# Patient Record
Sex: Female | Born: 1960 | Race: White | Hispanic: No | Marital: Married | Smoking: Former smoker
Health system: Southern US, Community
[De-identification: ages and names within clinical notes are randomized; demographics above are authoritative.]

## PROBLEM LIST (undated history)

## (undated) DIAGNOSIS — Z801 Family history of malignant neoplasm of trachea, bronchus and lung: Secondary | ICD-10-CM

## (undated) DIAGNOSIS — C50919 Malignant neoplasm of unspecified site of unspecified female breast: Secondary | ICD-10-CM

## (undated) DIAGNOSIS — I10 Essential (primary) hypertension: Secondary | ICD-10-CM

## (undated) DIAGNOSIS — T888XXA Other specified complications of surgical and medical care, not elsewhere classified, initial encounter: Secondary | ICD-10-CM

## (undated) DIAGNOSIS — C221 Intrahepatic bile duct carcinoma: Secondary | ICD-10-CM

## (undated) DIAGNOSIS — R739 Hyperglycemia, unspecified: Secondary | ICD-10-CM

## (undated) DIAGNOSIS — K529 Noninfective gastroenteritis and colitis, unspecified: Secondary | ICD-10-CM

## (undated) HISTORY — DX: Malignant neoplasm of unspecified site of unspecified female breast: C50.919

## (undated) HISTORY — PX: ABDOMINAL HYSTERECTOMY: SHX81

## (undated) HISTORY — DX: Family history of malignant neoplasm of trachea, bronchus and lung: Z80.1

## (undated) HISTORY — DX: Hyperglycemia, unspecified: R73.9

## (undated) HISTORY — PX: KNEE SURGERY: SHX244

## (undated) HISTORY — DX: Essential (primary) hypertension: I10

## (undated) HISTORY — PX: COLONOSCOPY: SHX174

## (undated) HISTORY — DX: Noninfective gastroenteritis and colitis, unspecified: K52.9

## (undated) HISTORY — PX: AUGMENTATION MAMMAPLASTY: SUR837

## (undated) HISTORY — PX: MASTECTOMY: SHX3

---

## 1997-12-04 ENCOUNTER — Ambulatory Visit (HOSPITAL_BASED_OUTPATIENT_CLINIC_OR_DEPARTMENT_OTHER): Admission: RE | Admit: 1997-12-04 | Discharge: 1997-12-04 | Payer: Self-pay

## 1997-12-18 ENCOUNTER — Ambulatory Visit (HOSPITAL_COMMUNITY): Admission: RE | Admit: 1997-12-18 | Discharge: 1997-12-19 | Payer: Self-pay

## 1998-01-16 ENCOUNTER — Ambulatory Visit (HOSPITAL_COMMUNITY): Admission: RE | Admit: 1998-01-16 | Discharge: 1998-01-17 | Payer: Self-pay

## 1998-06-17 ENCOUNTER — Other Ambulatory Visit: Admission: RE | Admit: 1998-06-17 | Discharge: 1998-06-17 | Payer: Self-pay | Admitting: Gynecology

## 1998-11-13 ENCOUNTER — Ambulatory Visit (HOSPITAL_COMMUNITY): Admission: RE | Admit: 1998-11-13 | Discharge: 1998-11-13 | Payer: Self-pay | Admitting: Oncology

## 1998-11-13 ENCOUNTER — Encounter: Payer: Self-pay | Admitting: Oncology

## 1999-01-01 ENCOUNTER — Ambulatory Visit (HOSPITAL_BASED_OUTPATIENT_CLINIC_OR_DEPARTMENT_OTHER): Admission: RE | Admit: 1999-01-01 | Discharge: 1999-01-01 | Payer: Self-pay | Admitting: Plastic Surgery

## 1999-11-18 ENCOUNTER — Encounter: Admission: RE | Admit: 1999-11-18 | Discharge: 1999-11-18 | Payer: Self-pay | Admitting: Oncology

## 1999-11-18 ENCOUNTER — Encounter: Admission: RE | Admit: 1999-11-18 | Discharge: 1999-11-18 | Payer: Self-pay

## 1999-11-18 ENCOUNTER — Encounter: Payer: Self-pay | Admitting: Oncology

## 2000-04-19 ENCOUNTER — Encounter: Admission: RE | Admit: 2000-04-19 | Discharge: 2000-04-19 | Payer: Self-pay | Admitting: Internal Medicine

## 2000-04-19 ENCOUNTER — Encounter: Payer: Self-pay | Admitting: Internal Medicine

## 2000-07-12 ENCOUNTER — Ambulatory Visit (HOSPITAL_COMMUNITY): Admission: RE | Admit: 2000-07-12 | Discharge: 2000-07-12 | Payer: Self-pay | Admitting: Gastroenterology

## 2000-07-12 ENCOUNTER — Encounter (INDEPENDENT_AMBULATORY_CARE_PROVIDER_SITE_OTHER): Payer: Self-pay | Admitting: Specialist

## 2000-11-02 ENCOUNTER — Encounter: Admission: RE | Admit: 2000-11-02 | Discharge: 2000-11-02 | Payer: Self-pay | Admitting: Gastroenterology

## 2000-11-02 ENCOUNTER — Encounter: Payer: Self-pay | Admitting: Gastroenterology

## 2000-11-22 ENCOUNTER — Encounter: Admission: RE | Admit: 2000-11-22 | Discharge: 2000-11-22 | Payer: Self-pay | Admitting: Oncology

## 2000-11-22 ENCOUNTER — Encounter: Payer: Self-pay | Admitting: Oncology

## 2001-07-06 ENCOUNTER — Other Ambulatory Visit: Admission: RE | Admit: 2001-07-06 | Discharge: 2001-07-06 | Payer: Self-pay | Admitting: Gynecology

## 2001-12-09 ENCOUNTER — Encounter: Admission: RE | Admit: 2001-12-09 | Discharge: 2001-12-09 | Payer: Self-pay | Admitting: Oncology

## 2001-12-09 ENCOUNTER — Encounter: Payer: Self-pay | Admitting: Oncology

## 2002-12-11 ENCOUNTER — Encounter: Admission: RE | Admit: 2002-12-11 | Discharge: 2002-12-11 | Payer: Self-pay | Admitting: Oncology

## 2003-02-13 ENCOUNTER — Other Ambulatory Visit: Admission: RE | Admit: 2003-02-13 | Discharge: 2003-02-13 | Payer: Self-pay | Admitting: Gynecology

## 2003-10-11 ENCOUNTER — Encounter (INDEPENDENT_AMBULATORY_CARE_PROVIDER_SITE_OTHER): Payer: Self-pay | Admitting: *Deleted

## 2003-10-11 ENCOUNTER — Inpatient Hospital Stay (HOSPITAL_COMMUNITY): Admission: EM | Admit: 2003-10-11 | Discharge: 2003-10-12 | Payer: Self-pay | Admitting: Emergency Medicine

## 2003-11-27 ENCOUNTER — Ambulatory Visit (HOSPITAL_COMMUNITY): Admission: RE | Admit: 2003-11-27 | Discharge: 2003-11-27 | Payer: Self-pay | Admitting: Gastroenterology

## 2003-12-02 ENCOUNTER — Ambulatory Visit: Payer: Self-pay | Admitting: Oncology

## 2003-12-12 ENCOUNTER — Encounter: Admission: RE | Admit: 2003-12-12 | Discharge: 2003-12-12 | Payer: Self-pay | Admitting: Oncology

## 2004-05-23 ENCOUNTER — Ambulatory Visit: Payer: Self-pay | Admitting: Oncology

## 2004-06-05 ENCOUNTER — Ambulatory Visit (HOSPITAL_COMMUNITY): Admission: RE | Admit: 2004-06-05 | Discharge: 2004-06-05 | Payer: Self-pay | Admitting: Oncology

## 2004-10-21 ENCOUNTER — Other Ambulatory Visit: Admission: RE | Admit: 2004-10-21 | Discharge: 2004-10-21 | Payer: Self-pay | Admitting: Gynecology

## 2004-11-21 ENCOUNTER — Ambulatory Visit: Payer: Self-pay | Admitting: Oncology

## 2004-12-12 ENCOUNTER — Encounter: Admission: RE | Admit: 2004-12-12 | Discharge: 2004-12-12 | Payer: Self-pay | Admitting: Oncology

## 2004-12-12 ENCOUNTER — Encounter: Admission: RE | Admit: 2004-12-12 | Discharge: 2004-12-12 | Payer: Self-pay | Admitting: Gynecology

## 2004-12-22 ENCOUNTER — Ambulatory Visit (HOSPITAL_BASED_OUTPATIENT_CLINIC_OR_DEPARTMENT_OTHER): Admission: RE | Admit: 2004-12-22 | Discharge: 2004-12-22 | Payer: Self-pay | Admitting: Gynecology

## 2004-12-22 ENCOUNTER — Ambulatory Visit (HOSPITAL_COMMUNITY): Admission: RE | Admit: 2004-12-22 | Discharge: 2004-12-22 | Payer: Self-pay | Admitting: Gynecology

## 2004-12-22 ENCOUNTER — Encounter (INDEPENDENT_AMBULATORY_CARE_PROVIDER_SITE_OTHER): Payer: Self-pay | Admitting: *Deleted

## 2005-12-11 ENCOUNTER — Ambulatory Visit: Payer: Self-pay | Admitting: Oncology

## 2005-12-14 ENCOUNTER — Encounter: Admission: RE | Admit: 2005-12-14 | Discharge: 2005-12-14 | Payer: Self-pay | Admitting: Oncology

## 2005-12-15 LAB — CBC WITH DIFFERENTIAL/PLATELET
BASO%: 0.6 % (ref 0.0–2.0)
MCHC: 33.7 g/dL (ref 32.0–36.0)
MONO#: 0.6 10*3/uL (ref 0.1–0.9)
RBC: 4.34 10*6/uL (ref 3.70–5.32)
WBC: 10.6 10*3/uL — ABNORMAL HIGH (ref 3.9–10.0)
lymph#: 1.6 10*3/uL (ref 0.9–3.3)

## 2005-12-15 LAB — COMPREHENSIVE METABOLIC PANEL
ALT: 13 U/L (ref 0–35)
CO2: 28 mEq/L (ref 19–32)
Calcium: 9.3 mg/dL (ref 8.4–10.5)
Chloride: 104 mEq/L (ref 96–112)
Sodium: 139 mEq/L (ref 135–145)
Total Bilirubin: 0.4 mg/dL (ref 0.3–1.2)
Total Protein: 6.5 g/dL (ref 6.0–8.3)

## 2005-12-15 LAB — LACTATE DEHYDROGENASE: LDH: 132 U/L (ref 94–250)

## 2005-12-30 ENCOUNTER — Other Ambulatory Visit: Admission: RE | Admit: 2005-12-30 | Discharge: 2005-12-30 | Payer: Self-pay | Admitting: Gynecology

## 2006-01-29 ENCOUNTER — Ambulatory Visit: Payer: Self-pay | Admitting: Oncology

## 2006-12-10 ENCOUNTER — Ambulatory Visit: Payer: Self-pay | Admitting: Oncology

## 2006-12-14 LAB — COMPREHENSIVE METABOLIC PANEL
ALT: 17 U/L (ref 0–35)
AST: 21 U/L (ref 0–37)
Alkaline Phosphatase: 62 U/L (ref 39–117)
Calcium: 9.5 mg/dL (ref 8.4–10.5)
Chloride: 102 mEq/L (ref 96–112)
Creatinine, Ser: 0.77 mg/dL (ref 0.40–1.20)
Total Bilirubin: 0.5 mg/dL (ref 0.3–1.2)

## 2006-12-14 LAB — CBC WITH DIFFERENTIAL/PLATELET
BASO%: 0.8 % (ref 0.0–2.0)
EOS%: 1.2 % (ref 0.0–7.0)
HCT: 40.8 % (ref 34.8–46.6)
MCH: 32.9 pg (ref 26.0–34.0)
MCHC: 34.7 g/dL (ref 32.0–36.0)
MCV: 94.8 fL (ref 81.0–101.0)
MONO%: 7.1 % (ref 0.0–13.0)
NEUT%: 72.9 % (ref 39.6–76.8)
lymph#: 1.2 10*3/uL (ref 0.9–3.3)

## 2006-12-16 ENCOUNTER — Encounter: Admission: RE | Admit: 2006-12-16 | Discharge: 2006-12-16 | Payer: Self-pay | Admitting: Oncology

## 2006-12-28 ENCOUNTER — Encounter: Admission: RE | Admit: 2006-12-28 | Discharge: 2006-12-28 | Payer: Self-pay | Admitting: Oncology

## 2007-03-29 ENCOUNTER — Encounter (INDEPENDENT_AMBULATORY_CARE_PROVIDER_SITE_OTHER): Payer: Self-pay | Admitting: Gynecology

## 2007-03-29 ENCOUNTER — Ambulatory Visit (HOSPITAL_BASED_OUTPATIENT_CLINIC_OR_DEPARTMENT_OTHER): Admission: RE | Admit: 2007-03-29 | Discharge: 2007-03-30 | Payer: Self-pay | Admitting: Gynecology

## 2007-12-09 ENCOUNTER — Ambulatory Visit: Payer: Self-pay | Admitting: Oncology

## 2007-12-13 LAB — COMPREHENSIVE METABOLIC PANEL
ALT: 15 U/L (ref 0–35)
AST: 24 U/L (ref 0–37)
Albumin: 4.8 g/dL (ref 3.5–5.2)
Alkaline Phosphatase: 79 U/L (ref 39–117)
BUN: 14 mg/dL (ref 6–23)
Potassium: 4.6 mEq/L (ref 3.5–5.3)

## 2007-12-13 LAB — LUTEINIZING HORMONE: LH: 40.4 m[IU]/mL

## 2007-12-13 LAB — CBC WITH DIFFERENTIAL/PLATELET
BASO%: 0.5 % (ref 0.0–2.0)
Basophils Absolute: 0 10*3/uL (ref 0.0–0.1)
EOS%: 2.1 % (ref 0.0–7.0)
HGB: 14.8 g/dL (ref 11.6–15.9)
MCH: 32.9 pg (ref 26.0–34.0)
MCHC: 33.7 g/dL (ref 32.0–36.0)
MCV: 97.6 fL (ref 81.0–101.0)
MONO%: 8.5 % (ref 0.0–13.0)
RBC: 4.49 10*6/uL (ref 3.70–5.32)
RDW: 13.3 % (ref 11.3–14.5)
lymph#: 1.3 10*3/uL (ref 0.9–3.3)

## 2007-12-19 ENCOUNTER — Encounter: Admission: RE | Admit: 2007-12-19 | Discharge: 2007-12-19 | Payer: Self-pay | Admitting: Oncology

## 2007-12-25 LAB — ESTRADIOL, ULTRA SENS

## 2008-02-01 ENCOUNTER — Encounter: Admission: RE | Admit: 2008-02-01 | Discharge: 2008-02-01 | Payer: Self-pay | Admitting: Oncology

## 2008-02-24 ENCOUNTER — Ambulatory Visit: Payer: Self-pay | Admitting: Oncology

## 2008-02-28 LAB — CBC WITH DIFFERENTIAL/PLATELET
BASO%: 0.4 % (ref 0.0–2.0)
Basophils Absolute: 0 10*3/uL (ref 0.0–0.1)
Eosinophils Absolute: 0.1 10*3/uL (ref 0.0–0.5)
HCT: 42 % (ref 34.8–46.6)
HGB: 14.4 g/dL (ref 11.6–15.9)
MONO#: 0.4 10*3/uL (ref 0.1–0.9)
NEUT#: 5.4 10*3/uL (ref 1.5–6.5)
NEUT%: 76.1 % (ref 39.6–76.8)
WBC: 7.1 10*3/uL (ref 3.9–10.0)
lymph#: 1.2 10*3/uL (ref 0.9–3.3)

## 2008-02-28 LAB — COMPREHENSIVE METABOLIC PANEL
ALT: 19 U/L (ref 0–35)
CO2: 25 mEq/L (ref 19–32)
Calcium: 9.5 mg/dL (ref 8.4–10.5)
Chloride: 106 mEq/L (ref 96–112)
Creatinine, Ser: 0.99 mg/dL (ref 0.40–1.20)
Glucose, Bld: 82 mg/dL (ref 70–99)

## 2008-02-28 LAB — LACTATE DEHYDROGENASE: LDH: 153 U/L (ref 94–250)

## 2008-03-21 LAB — BASIC METABOLIC PANEL
Calcium: 9.4 mg/dL (ref 8.4–10.5)
Chloride: 104 mEq/L (ref 96–112)
Creatinine, Ser: 1 mg/dL (ref 0.40–1.20)

## 2008-12-19 ENCOUNTER — Encounter: Admission: RE | Admit: 2008-12-19 | Discharge: 2008-12-19 | Payer: Self-pay | Admitting: Oncology

## 2009-02-01 ENCOUNTER — Ambulatory Visit: Payer: Self-pay | Admitting: Oncology

## 2009-02-06 LAB — CBC WITH DIFFERENTIAL/PLATELET
Basophils Absolute: 0.1 10*3/uL (ref 0.0–0.1)
Eosinophils Absolute: 0.1 10*3/uL (ref 0.0–0.5)
HGB: 13.9 g/dL (ref 11.6–15.9)
LYMPH%: 19.6 % (ref 14.0–49.7)
MONO#: 0.4 10*3/uL (ref 0.1–0.9)
NEUT#: 5 10*3/uL (ref 1.5–6.5)
Platelets: 174 10*3/uL (ref 145–400)
RBC: 4.19 10*6/uL (ref 3.70–5.45)
RDW: 13.9 % (ref 11.2–14.5)
WBC: 6.9 10*3/uL (ref 3.9–10.3)

## 2009-02-06 LAB — COMPREHENSIVE METABOLIC PANEL
Albumin: 4.7 g/dL (ref 3.5–5.2)
BUN: 19 mg/dL (ref 6–23)
CO2: 23 mEq/L (ref 19–32)
Glucose, Bld: 86 mg/dL (ref 70–99)
Potassium: 4.7 mEq/L (ref 3.5–5.3)
Sodium: 139 mEq/L (ref 135–145)
Total Protein: 6.5 g/dL (ref 6.0–8.3)

## 2009-02-06 LAB — LACTATE DEHYDROGENASE: LDH: 153 U/L (ref 94–250)

## 2009-03-08 ENCOUNTER — Ambulatory Visit: Payer: Self-pay | Admitting: Oncology

## 2009-03-12 LAB — BASIC METABOLIC PANEL
Calcium: 9.9 mg/dL (ref 8.4–10.5)
Sodium: 139 mEq/L (ref 135–145)

## 2009-12-20 ENCOUNTER — Encounter: Admission: RE | Admit: 2009-12-20 | Discharge: 2009-12-20 | Payer: Self-pay | Admitting: Oncology

## 2010-01-31 ENCOUNTER — Ambulatory Visit: Payer: Self-pay | Admitting: Oncology

## 2010-02-04 LAB — COMPREHENSIVE METABOLIC PANEL
ALT: 20 U/L (ref 0–35)
AST: 24 U/L (ref 0–37)
Albumin: 5 g/dL (ref 3.5–5.2)
Alkaline Phosphatase: 57 U/L (ref 39–117)
BUN: 17 mg/dL (ref 6–23)
CO2: 27 mEq/L (ref 19–32)
Calcium: 10.5 mg/dL (ref 8.4–10.5)
Chloride: 102 mEq/L (ref 96–112)
Creatinine, Ser: 0.74 mg/dL (ref 0.40–1.20)
Glucose, Bld: 111 mg/dL — ABNORMAL HIGH (ref 70–99)
Potassium: 4.4 mEq/L (ref 3.5–5.3)
Sodium: 138 mEq/L (ref 135–145)
Total Bilirubin: 0.6 mg/dL (ref 0.3–1.2)
Total Protein: 6.8 g/dL (ref 6.0–8.3)

## 2010-02-04 LAB — CBC WITH DIFFERENTIAL/PLATELET
BASO%: 0.6 % (ref 0.0–2.0)
Basophils Absolute: 0 10*3/uL (ref 0.0–0.1)
EOS%: 0.7 % (ref 0.0–7.0)
Eosinophils Absolute: 0 10*3/uL (ref 0.0–0.5)
HCT: 41.5 % (ref 34.8–46.6)
HGB: 14 g/dL (ref 11.6–15.9)
LYMPH%: 18.4 % (ref 14.0–49.7)
MCH: 33.2 pg (ref 25.1–34.0)
MCHC: 33.7 g/dL (ref 31.5–36.0)
MCV: 98.6 fL (ref 79.5–101.0)
MONO#: 0.5 10*3/uL (ref 0.1–0.9)
MONO%: 8.1 % (ref 0.0–14.0)
NEUT#: 4.8 10*3/uL (ref 1.5–6.5)
NEUT%: 72.2 % (ref 38.4–76.8)
Platelets: 171 10*3/uL (ref 145–400)
RBC: 4.21 10*6/uL (ref 3.70–5.45)
RDW: 13.3 % (ref 11.2–14.5)
WBC: 6.6 10*3/uL (ref 3.9–10.3)
lymph#: 1.2 10*3/uL (ref 0.9–3.3)

## 2010-02-04 LAB — LACTATE DEHYDROGENASE: LDH: 143 U/L (ref 94–250)

## 2010-03-13 ENCOUNTER — Other Ambulatory Visit: Payer: Self-pay | Admitting: Oncology

## 2010-03-13 DIAGNOSIS — Z9011 Acquired absence of right breast and nipple: Secondary | ICD-10-CM

## 2010-06-10 NOTE — H&P (Signed)
Caitlin Pollard, WOODHAM                ACCOUNT NO.:  [id number]   MEDICAL RECORD NO.:  [id number]          PATIENT TYPE:  AMB   LOCATION:  [facility]                         FACILITY:  [facility]   PHYSICIAN:  Caitlin Pollard, M.D. DATE OF BIRTH:  01/16/1961   DATE OF ADMISSION:  DATE OF DISCHARGE:                              HISTORY & PHYSICAL   CHIEF COMPLAINT:  Abnormal uterine bleeding with endometrial polyp and  simple hyperplasia by ultrasound and by endometrial sampling.   HISTORY OF PRESENT ILLNESS:  A 50 year old, G3, P3, under the primary  care of Dr. Cyndie Chime and this office.  She has a history of breast  cancer diagnosed and treated in November 1999. She had chemotherapy and  tamoxifen therapy for some 9 years now.  She has a previous history of  multiple endometrial polyps treated by hysteroscopy resection of all the  visible polyps and total endometrial ablation on December 22, 2004.  She  did well for several years until recurrence of this bleeding.  Now on  ultrasound, she has a very large endometrial polyp at the fundus of the  uterus confirmed by sampling. No evidence of malignancy.  She is now  admitted for definitive therapy by supracervical hysterectomy and  bilateral salpingo-oophorectomy.  She has continued to have evidence of  hormone effect and normal periods up until HRA in spite of her  chemotherapy.  She is now admitted for definitive therapy by  supracervical hysterectomy and salpingo-oophorectomy as an outpatient.  She will then cease tamoxifen therapy.   PAST MEDICAL HISTORY:  Usual childhood diseases without sequelae.   Medical illnesses include breast cancer identified in 1999 as above with  mastectomy and axillary node dissection.  She subsequently had breast  reconstruction by Dr. Benna Pollard in 2000.  She has no other significant medical history, does have a history of ACL  tear and reconstruction by Dr. Thomasena Pollard in 2000.   HABITS:  The patient has a long-term  tobacco user.  We have attempted  smoking cessation for many years.  She now states that she is a social  smoker only.  I have encouraged her to complete withdrawal.  Denies  recreational drugs or alcohol.   FAMILY HISTORY:  Father died of lung cancer. Mother has asthma and  hypothyroid autoimmune.   REVIEW OF SYSTEMS:  HEENT: Denies symptoms.  CARDIORESPIRATORY:  Denies  asthma, cough or shortness of breath.  GI-GU:  Denies frequency,  urgency, dysuria, change in bowel habits, food intolerance.   PHYSICAL EXAMINATION:  GENERAL:  Well-developed, well-nourished, thin  white female.  HEENT:  Pupils equal and react to light and accommodation.  Fundi  benign.  Oropharynx clear.  NECK:  Supple without mass or thyroid enlargement.  CHEST:  Clear to percussion and auscultation.  BREASTS:  Right mastectomy and reconstruction. Left breast without mass,  nodes, discharge.  PELVIC:  External genitalia normal female. Vagina clean, atrophic in  appearance. Cervix is parous.  Uterus is normal size, shape, contour.  Adnexa clear.  Rectovaginal confirms  EXTREMITIES:  Negative.  NEUROLOGIC:  Physiologic.   IMPRESSION:  1. Abnormal  uterine bleeding with recurrence of endometrial polyp on      tamoxifen. initial therapy November 1999 by hysteroscopy resection      ablation, now with recurrence.  2. Personal history of breast cancer treated by chemotherapy and      tamoxifen.  3. Crohn's disease, well controlled under the care of Dr. Ewing Schlein.           ______________________________  Caitlin Pollard, M.D.     CWL/MEDQ  D:  03/28/2007  T:  03/28/2007  Job:  [id number]   cc:   Genene Churn. Cyndie Chime, M.D.  Fax: [phone]

## 2010-06-10 NOTE — Op Note (Signed)
NAMEJACKQUELYN, SUNDBERG                ACCOUNT NO.:  [id number]   MEDICAL RECORD NO.:  [id number]          PATIENT TYPE:  AMB   LOCATION:  [facility]                         FACILITY:  [facility]   PHYSICIAN:  Gretta Cool, M.D. DATE OF BIRTH:  October 09, 1960   DATE OF PROCEDURE:  03/29/2007  DATE OF DISCHARGE:                               OPERATIVE REPORT   PREOPERATIVE DIAGNOSES:  1. Large endometrial polyp by ultrasound, recurrent after a      hysteroscopy, resection and ablation in 2006.  2. Long-term nine years of therapy with tamoxifen.  Negative      endometrial polyp by dilatation and curettage on March 07, 2007.   POSTOPERATIVE DIAGNOSES:  1. Large endometrial polyp by ultrasound, recurrent after a      hysteroscopy, resection and ablation in 2006.  2. Long-term nine years of therapy with tamoxifen.  Negative      endometrial polyp by dilatation and curettage on March 07, 2007.   PROCEDURE:  Exploratory laparotomy, super-cervical hysterectomy,  bilateral salpingo-oophorectomy.   FINDINGS:  Tissue removed and submitted to pathology.  On opening the  uterus, a large and necrotic-looking endometrial polyp approximately 3  cm x 3 cm submitted for pathologic consultation.  Multiple other uterine  leiomyomata present.   SURGEON:  Gretta Cool, M.D.   ASSISTANT:  Gerrit Friends. Aldona Bar, M.D.   SCRUB NURSE:  Ms. Maryruth Eve.   ANESTHESIA:  General orotracheal anesthesia.   DESCRIPTION OF PROCEDURE:  Under excellent anesthesia as above, with the  patient prepped and draped in the supine position, with the Foley  catheter draining her bladder, a Pfannenstiel incision was made and  extended through the fascia.  The peritoneum was then opened and the  abdomen explored.  There were no abnormalities identified in the upper  abdomen.  An examination of the pelvis revealed a large uterus.  She had  a fundus that virtually filled the pelvis.  Both ovaries appeared quite  atrophic.  At this  point, clamps were placed across the adnexal  structures and the uterus elevated.  The round ligaments were transected  by cautery and the anterior leaf of the broad ligament was opened.  The  infundibulopelvic vessels were then skeletonized, clamped, cut, sutured  and tied with #0 Vicryl.  A second free tie was used to doubly ligate  the pedicle.  At this point, the uterine vessels were skeletonized,  clamped, cut, sutured and tied with #0 Vicryl.  The upper portion of the  Cardinal ligament was then clamped, cut, sutured and tied.  A super-  cervical hysterectomy was then performed, so as to remove virtually all  of the endocervical canal.  The residual canal was treated by cautery,  so as to eliminate any island of viable endometrium or endocervical  tissue.  At this point the cervix was closed transversely with a running  suture of #0 Vicryl.  At this point, the pelvis was irrigated and all  the clots and debris removed.  The pelvic peritoneum was closed with  running suture of #2-0 Monocryl.  The sponges and retractors were  then  removed.  The abdominal peritoneum was then closed with a running suture  of #0 Monocryl.  The muscles were plicated in the midline with #0  Monocryl as well.  At this point the fascia was approximated with a  running suture of #0 Vicryl from each angle to the midline.  The  subcutaneous tissues were approximated with interrupted sutures of #3-0  Vicryl and the skin closed with skin staples and Steri-Strips.  At the  end of the procedure, the sponge and lap counts were correct.  There  were no complications.   The patient returned to the recovery room in excellent condition.           ______________________________  Gretta Cool, M.D.     CWL/MEDQ  D:  03/29/2007  T:  03/29/2007  Job:  [id number]   cc:   Genene Churn. Cyndie Chime, M.D.  Fax: [phone]   Gerrit Friends. Aldona Bar, M.D.  Fax: [phone]

## 2010-06-13 NOTE — Procedures (Signed)
[place]. [facility]  Patient:    Caitlin Pollard, Caitlin Pollard                         MRN: [id number] Proc. Date: 07/12/00 Attending:  Petra Kuba, M.D. CC:         Valetta Mole. Swords, M.D. [facility]  Gretta Cool, M.D.  Genene Churn. Cyndie Chime, M.D.   Procedure Report  PROCEDURE:  Colonoscopy with biopsy.  INDICATION:  Patient with questionable irritable bowel-like symptoms, questionably due to her tamoxifen or Nolvadex.  Breast cancer at a young age. Consent was signed after risks, benefits, methods, and options thoroughly discussed in the office.  MEDICATIONS:  Demerol 100 mg, Versed 10 mg.  DESCRIPTION OF PROCEDURE:  Rectal inspection was pertinent for external hemorrhoids.  Digital exam was negative.  The pediatric video colonoscope was inserted, easily advanced around the colon to the cecum.  On insertion, some scattered small ulcerations were seen through the proximal sigmoid to the approximate level of the hepatic flexure.  No obvious mass lesions or significant erythema were seen.  The cecum was identified by the appendiceal orifice and the ileocecal valve.  To advance to the cecum did not require any abdominal pressure or any position changes.  The scope was inserted a short way into the terminal ileum, which was normal.  Photo documentation and scattered biopsies were obtained.  The scope was slowly withdrawn.  Prep was adequate.  There was some liquid stool that required washing and suctioning. On slow withdrawal through the colon, biopsies of the ulcerative areas were obtained.  Again no other abnormalities were seen other than these ulcerative areas.  Again only minimal inflammation was seen.  Once back in the rectum, s a small rectal polyp was seen, which was cold biopsied x 1.  The scope was retroflexed, pertinent for some internal hemorrhoids.  Scope was reinserted a short way up the sigmoid.  Air was suctioned and the scope removed.  The patient  tolerated the procedure well.  There was no obvious immediate complication.  ENDOSCOPIC DIAGNOSES: 1. Small internal-external hemorrhoids. 2. Tiny rectal polyp, cold biopsied. 3. Midsigmoid to the hepatic flexure with occasional ulcerations with minimal    erythema, status post biopsy. 4. Otherwise within normal limits to the terminal ileum, status post biopsy.  PLAN:  Await pathology to determine is this Crohns disease, or is this medicine-induced.  Will also wait on the rectal biopsy to help determine future colonic screening.  Pending results of biopsies, may need to talk to Dr. Cyndie Chime about medicine choices.  In the meantime, will try to limit aspirin and nonsteroidals and proceed with Tylenol.  Call me sooner p.r.n. DD:  07/12/00 TD:  07/12/00 Job: [id number]

## 2010-06-13 NOTE — Op Note (Signed)
NAME:  Caitlin Pollard, Caitlin Pollard                          ACCOUNT NO.:  [id number]   MEDICAL RECORD NO.:  [id number]                   PATIENT TYPE:  INP   LOCATION:  0257                                 FACILITY:  [facility]   PHYSICIAN:  Petra Kuba, M.D.                 DATE OF BIRTH:  [birth year]   DATE OF PROCEDURE:  10/11/2003  DATE OF DISCHARGE:                                 OPERATIVE REPORT   PROCEDURE:  Esophagogastroduodenoscopy with biopsy.   INDICATIONS:  Patient with abdominal pain and abnormal CAT scan with some  question of a gastric lesion.  Want to evaluate.   Consent was signed after risks, benefits, methods, options thoroughly  discussed by me in the past as well as prior to any sedation and by Dr.  Randa Evens today.   MEDICINES USED:  Demerol 50, Versed 5.   PROCEDURE:  The video endoscope was inserted by direct vision.  The  esophagus was normal.  The scope was passed into the stomach, pertinent for  some very minimal antritis.  Advanced through a normal pylorus into a normal  duodenal bulb and around the C loop to a normal second portion of the  duodenum.  The scope was withdrawn back into the bulb, and a good look there  ruled out abnormalities in that location.  The scope was withdrawn back to  the stomach and retroflexed.  The angularis, cardia, and fundus were all  normal.  In the proximal stomach along the lesser curve was a little bit of  erythema in two swollen folds, which were cold-biopsied on straight  visualization.  Straight visualization confirmed these lesions but ruled out  every other problem with good look at the greater and lesser curve.  We went  ahead and readvanced the scope in the second portion of the duodenum.  No  additional findings were seen.  Again, the stomach was re-evaluated by  straight and retroflexed visualization without additional findings.  The air  was suctioned, and the scope was slowly withdrawn.  Again, a good look at  the esophagus  was normal.  The scope was removed.  The patient tolerated the  procedure well.  There were no obvious immediate complications.   ENDOSCOPIC DIAGNOSES:  1.  Minimal gastritis/antritis.  2.  Slight increased erythema of a few proximal folds, status post biopsy.  3.  Otherwise normal esophagogastroduodenoscopy.   PLAN:  1.  Continue Protonix.  2.  Await pathology.  3.  Continue working with MRI and MRCP to better evaluate her pancreas.  4.  Ovarian cyst workup per gynecology.  5.  Will okay clear liquids for now when awake.  6.  Slowly advance diet as tolerated.  Petra Kuba, M.D.    MEM/MEDQ  D:  10/11/2003  T:  10/11/2003  Job:  [id number]   cc:   Wanda Plump, MD [facility]  [phone] [address] [place], [place] [address]

## 2010-06-13 NOTE — Consult Note (Signed)
NAMEMarland Kitchen  Caitlin Pollard, Caitlin Pollard                          ACCOUNT NO.:  [id number]   MEDICAL RECORD NO.:  [id number]                   PATIENT TYPE:  INP   LOCATION:  0257                                 [facility]ACILITY:  [facility]   PHYSICIAN:  Llana Aliment. Malon Kindle., M.D.          DATE OF BIRTH:  [birth year]   DATE OF CONSULTATION:  10/11/2003  DATE OF DISCHARGE:                                   CONSULTATION   REASON FOR CONSULTATION:  Nausea, vomiting, abdominal pain.   HISTORY:  A very nice 50 year old woman who had mild Crohn's disease  diagnosed by colonoscopy a year or so ago, has been on Asacol and doing  quite well with that.  She had an upper GI and small-bowel follow-through  within the past couple of years that showed no evidence of small bowel  Crohn's.  She has been doing quite well for the past several weeks.  No  diarrhea, occasional loose stools.  No abdominal pain, eating well,  maintaining her weight.  The only thing that she has which bothers her is  chronic back pain which interferes with her tennis.  She takes a fairly  large quantity of ibuprofen for that.  She was on a trip to [place] about a  week ago.  One day when she was deep sea fishing, had epigastric pain and  nausea which she attributed to being at sea.  She and her husband notes that  they were drinking much more than usual during this vacation time.  She got  better after approximately a day or so, came home, just did not really feel  good, but had no other specific symptoms.  She has had poor appetite but has  been eating foods and, in particular, has eaten some fatty foods without any  worsening of her pain, and pain and nausea have not really been a problem.  She just simply has not felt good.  She has had no heartburn or indigestion,  has been taking ibuprofen.  She came into the emergency room last night  after she began to have severe pain yesterday in the epigastric area, was  radiating to the left upper quadrant, did  not go through to the back, was  intense, and she came to the emergency room.  She had vomiting, has been  extremely nauseated, and had no hematemesis.  She received Dilaudid in the  emergency room and has had a work-up that has included an ultrasound and CT.  She had a complex cyst of one ovary, thickened stomach on CT, and more  concerning, cystic mass in the mid portion of her pancreas with no obvious  inflammation around this to suggest pancreatitis.   MEDICATIONS ON ADMISSION:  1.  Lorcet p.r.n.  2.  Motrin, ibuprofen, Tylenol frequently due to back pain.  3.  Asacol 2-3 times daily.  4.  Tamoxifen.   ALLERGIES:  She has no drug allergies.  MEDICAL HISTORY:  1.  History of Crohn's.  Biopsy on colonoscopy with no evidence of small      bowel disease on small-bowel follow-through and has been in remission.  2.  Chronic back pain, is followed by Dr. Shelle Iron, is having MRI for her back.  3.  Breast cancer at age 75, status post right mastectomy with      reconstructive surgery.  All check-ups have been well.  She remains      chronically on Tamoxifen.  4.  Only other surgery has been an Biochemist, clinical of her knee.   FAMILY HISTORY:  Father died of lung cancer.  Mother has asthma.  There is  no family history of any other cancer.   SOCIAL HISTORY:  She is married, smokes 1/2 pack a day, drinks at least one  drink a day but has been drinking more heavily while in [place].   REVIEW OF SYSTEMS:  Remarkable for lack of heartburn or indigestion.  Up  until this episode in [place], she was eating well, maintaining her weight,  having no problems at all tolerating fatty foods, no heartburn or  indigestion.  No rectal bleeding.  Stools were intermittently soft or loose,  no more than 1-2 a day, had not changed recently.  No chest pain, shortness  of breath, trouble breathing.  She plays tennis and has been doing well with  that up until recently.   PHYSICAL EXAMINATION:  VITAL SIGNS:  The  patient was afebrile in the  emergency room.  Pulse 68, blood pressure 166/89.  O2 saturation 100% on  room air.  GENERAL:  Very nervous white female in no acute distress.  HEENT:  Eyes:  Sclerae not icteric.  Extraocular movements intact.  LUNGS:  Clear.  HEART:  Regular rate and rhythm without murmurs or gallops.  ABDOMEN:  Soft, nontender, nondistended with mild epigastric tenderness, and  bowel sounds are present.   CT scan reviewed, showed findings as noted above:  A cystic mass in the mid  portion of the pancreas, thickened gastric wall on the greater curve, and an  ovarian cyst.  Pertinent labs remarkable for lipase only slightly up at 52.  Amylase, liver function, CBC are all normal other than a white count of  10.7.  Pregnancy test negative.   ASSESSMENT:  1.  Nausea, vomiting, and abdominal pain - This could be due to a multitude      of things.  It is certainly is concerning with the lesion in her      pancreas.  It is possible she could have had pancreatitis and maybe even      a gastric ulcer causing the thickened gastric wall from her recent trip      to [place] and increased ibuprofen and alcohol use.  Certainly, with her      history of breast cancer, recurrence of her breast cancer in the      pancreas is possible.  This is not an uncommon force of      nongastrointestinal metastasis to the pancreas.  Pancreatic cyst from      benign pancreatitis is also quite possible.  2.  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  3.  Complex ovarian cyst.   PLAN:  1.  We will start off with an endoscopy this afternoon which Dr. Ewing Schlein will      perform.  2.  We will order an MRI and MRCP of her pancreas tomorrow.  James L. Malon Kindle., M.D.    Waldron Session  D:  10/11/2003  T:  10/11/2003  Job:  [id number]   cc:   Valetta Mole. Swords, M.D. [facility]   Petra Kuba, M.D.  [address] [place]  [place] [address]  Fax:  [phone]   Gretta Cool, M.D.  [address] [place]  [place] [address]  Fax: [phone]

## 2010-06-13 NOTE — Op Note (Signed)
Caitlin Pollard, Caitlin Pollard                ACCOUNT NO.:  [id number]   MEDICAL RECORD NO.:  [id number]          PATIENT TYPE:  AMB   LOCATION:  [facility]                         FACILITY:  [facility]   PHYSICIAN:  Gretta Cool, M.D. DATE OF BIRTH:  1960/04/14   DATE OF PROCEDURE:  DATE OF DISCHARGE:                                 OPERATIVE REPORT   PREOPERATIVE DIAGNOSIS:  Endometrial polyps on tamoxifen therapy x6 years.   POSTOPERATIVE DIAGNOSIS:  Endometrial polyps on tamoxifen therapy x6 years.   PROCEDURE:  Hysteroscopy, resection of multiple endometrial polyps and total  endometrial resection for ablation.   SURGEON:  Beather Arbour, MD.   ANESTHESIA:  MAC and the local.   DESCRIPTION OF PROCEDURE:  Under excellent IV sedation and paracervical  block, the cervix was progressively dilated with a series of Pratt dilators  to accommodate the 7 mm resectoscope. The resectoscope was then introduced  and the entire endometrial cavity photographed. There were multiple  endometrial polyps and very thickened endometrium throughout the entire  cavity. Once the cavity was photographed and all pathology identified, there  was no evidence of more than the previous endometrial sampling had revealed.  The entire endometrial cavity was then resected with the 90 degree  resectoscope loop. Once the entire endometrial cavity was resected, the  corneal areas were treated by touch technique using the VaporTrode. The  entire uterine cavity was then treated by VaporTrode so as to eliminate any  islands of endometrium that remained viable and the superficial myometrium.  At this point with reduced pressure, there was no significant bleeding. The  patient returned to the recovery room in excellent condition. Estimated  fluid deficit approximately 200 mL.           ______________________________  Gretta Cool, M.D.     CWL/MEDQ  D:  12/22/2004  T:  12/22/2004  Job:  [id number]   cc:   Genene Churn. Cyndie Chime,  M.D.  Fax: [phone]   Gretta Cool, M.D.  Fax: [phone]

## 2010-06-13 NOTE — H&P (Signed)
NAME:  Caitlin Pollard, Caitlin Pollard                          ACCOUNT NO.:  [id number]   MEDICAL RECORD NO.:  [id number]                   PATIENT TYPE:  EMS   LOCATION:  ED                                   FACILITY:  [facility]   PHYSICIAN:  Wanda Plump, MD [facility]                 DATE OF BIRTH:  11-Feb-1960   DATE OF ADMISSION:  10/10/2003  DATE OF DISCHARGE:                                HISTORY & PHYSICAL   CHIEF COMPLAINT:  Abdominal pain.   HISTORY OF PRESENT ILLNESS:  Ms. Grigoryan is a 50 year old white female who  developed mild discomfort at the epigastric area about a week ago.  Yesterday the pain became suddenly severe and it was constant.  It was not  only located at the epigastric area but also worse radiating around to the  left upper quadrant of the abdomen.  She denies any radiation to the back.  The pain was described as, a hollow feeing, and it was so intense that she  decided to come to the ER.  She did have one episode of vomiting but denies  any hematemesis.  In the ER the pain decreased with IV Dilaudid but as soon  as it wore off the pain came right back.  Consequently I was asked to admit  the patient for further evaluation.  In addition, the CT of the abdomen is  abnormal and that needs further work up.   PAST MEDICAL HISTORY:  1.  Breast cancer at age 46.  She is under the care of Dr. Cyndie Chime and      all her recent checkups have been negative.  2.  In 2002 she had a colonoscopy. At that time the biopsy showed acute and      chronic colitis and she was diagnosed with a mild case of Crohn's      disease.  At the same time she had a normal upper GI and small bowel      series.  3.  She has chronic back pain.  An MRI was ordered by Dr. Shelle Iron 6 months ago      and it showed degenerative disk disease.  4.  She denies any history of appendectomy, or cholecystectomy.   FAMILY HISTORY:  1.  Father had lung cancer.  2.  Mother has asthma.   MEDICATIONS:  1.  Lorcet p.r.n.  2.   Tamoxifen.  3.  Asacol.  4.  Takes Motrin quite often, Tylenol over-the-counter, due to the back pain      (for several months).   REVIEW OF SYSTEMS:  She denies any fever. She admits to a decreased appetite  but could not tell me if the pain was exacerbated by eating.  She denies any  heart burn, diarrhea, her stools have been normal with no red blood or  melena.  Denies any chest pain, shortness of breath or cough.  No  dysuria or  gross hematuria.  Her periods are very irregular and in fact her last  menstrual period was in May, 2005.  The patient recently had a trip to  [place] where she admits she drank much more than usual.   SOCIAL HISTORY:  1.  She smokes one-third of a pack a day.  2.  She drinks alcohol socially, more so in the last few days.   ALLERGIES:  No known drug allergies.   PHYSICAL EXAMINATION:  GENERAL:  The patient is alert, she is slightly  somnolent due to the pain medications she just received but she is coherent  and cooperative.  VITAL SIGNS:  Temperature 98, pulse 67, blood pressure initially was 166/89.  She weighs 140 pounds.  Her O2 saturation is 100% on room air.  NECK:  No lymphadenopathy.  LUNGS:  Clear to auscultation bilaterally.  CARDIOVASCULAR:  Regular rate and rhythm without murmur.  ABDOMEN:  Nondistended, soft, she has good bowel sounds. At the time of my  exam there is minimal tenderness mostly at the left upper quadrant without  any mass or rebound.  EXTREMITIES:  Show no edema.  NEUROLOGICAL EXAM:  Motor, speech are intact.   LABORATORY/X-RAYS:  Urinalysis is negative.  Amylase is normal, lipase is 52  which is above the upper normal which is 51.  CBC showed a white count of  10,700, hemoglobin 13.5, platelets 162,000.  Potassium 3.8, BUN 12, creatinine 0.8, blood sugar 118.  AST is 32, ALT 26,  alkaline phosphatase 68, ALT 26, alkaline phosphatase 68.  Urine pregnancy  test is negative.   CT of the abdomen shows a circumferential  mass like thickening at the mid  stomach and the radiologist is concerned about malignancy and recommends  endoscopy.  Also seen is a 1.3 hypodense lesion at the tail of the pancreas  and again, neoplasm is one of the concerns in the differential diagnosis.  She has a 4.6 x 2.9 cm right ovarian lesion consistent with a complex cyst  but ultrasound follow up may be advisable.   ASSESSMENT AND PLAN:  1.  Ms. Lehtinen has been admitted to the hospital with intense abdominal pain      and an abnormal CT of the abdomen.  The differential diagnoses of her      symptoms includes  peptic ulcer disease, exacerbation of the Crohn's disease, resolving  pancreatitis, in light of the recent increasing alcohol intake.  Of concern  is also the thickening of the stomach seen on CT.  At this point she will be  admitted to the hospital for IV hydration as well as IV Protonix and pain  control.  A GI consult will be called soon.   1.  She has an abnormal CT of the pelvis and I am planning to order      ultrasound.   1.  She has chronic low back pain so she will have Lorcet p.r.n.   1.  The doses of Tamoxifen and Asacol are not clear to me and at this point      we will hold these medications.                                               Wanda Plump, MD [facility]    JEP/MEDQ  D:  10/11/2003  T:  10/11/2003  Job:  [id number] 

## 2010-10-20 LAB — POCT HEMOGLOBIN-HEMACUE
Hemoglobin: 15
Operator id: 268271

## 2010-10-20 LAB — POCT PREGNANCY, URINE
Operator id: 268271
Preg Test, Ur: NEGATIVE

## 2010-12-23 ENCOUNTER — Ambulatory Visit
Admission: RE | Admit: 2010-12-23 | Discharge: 2010-12-23 | Disposition: A | Discharge status: Home / Self Care | Payer: BC Managed Care – PPO | Source: Ambulatory Visit | Attending: Oncology | Admitting: Oncology

## 2010-12-23 DIAGNOSIS — Z9011 Acquired absence of right breast and nipple: Secondary | ICD-10-CM

## 2011-01-29 ENCOUNTER — Telehealth: Payer: Self-pay | Admitting: Oncology

## 2011-01-29 NOTE — Telephone Encounter (Signed)
called pts home lmovm that her appts in jan 2013 were cancelled and to rtn call to r/s to [phone]

## 2011-01-29 NOTE — Telephone Encounter (Signed)
pt rtn call and scheduled appt for [phone]

## 2011-02-03 ENCOUNTER — Other Ambulatory Visit: Payer: BC Managed Care – PPO | Admitting: Lab

## 2011-04-03 ENCOUNTER — Other Ambulatory Visit (HOSPITAL_BASED_OUTPATIENT_CLINIC_OR_DEPARTMENT_OTHER): Payer: BC Managed Care – PPO | Admitting: Lab

## 2011-04-03 ENCOUNTER — Ambulatory Visit (HOSPITAL_BASED_OUTPATIENT_CLINIC_OR_DEPARTMENT_OTHER): Payer: BC Managed Care – PPO | Admitting: Oncology

## 2011-04-03 VITALS — BP 153/105 | HR 94 | Temp 97.1°F | Ht 67.0 in | Wt 139.0 lb

## 2011-04-03 DIAGNOSIS — K529 Noninfective gastroenteritis and colitis, unspecified: Secondary | ICD-10-CM

## 2011-04-03 DIAGNOSIS — Z978 Presence of other specified devices: Secondary | ICD-10-CM

## 2011-04-03 DIAGNOSIS — I10 Essential (primary) hypertension: Secondary | ICD-10-CM

## 2011-04-03 DIAGNOSIS — Z170 Estrogen receptor positive status [ER+]: Secondary | ICD-10-CM

## 2011-04-03 DIAGNOSIS — C50919 Malignant neoplasm of unspecified site of unspecified female breast: Secondary | ICD-10-CM

## 2011-04-03 DIAGNOSIS — K589 Irritable bowel syndrome without diarrhea: Secondary | ICD-10-CM

## 2011-04-03 DIAGNOSIS — D259 Leiomyoma of uterus, unspecified: Secondary | ICD-10-CM

## 2011-04-03 DIAGNOSIS — R739 Hyperglycemia, unspecified: Secondary | ICD-10-CM

## 2011-04-03 DIAGNOSIS — R7309 Other abnormal glucose: Secondary | ICD-10-CM

## 2011-04-03 DIAGNOSIS — Z853 Personal history of malignant neoplasm of breast: Secondary | ICD-10-CM

## 2011-04-03 LAB — CBC WITH DIFFERENTIAL/PLATELET
BASO%: 0.6 % (ref 0.0–2.0)
Basophils Absolute: 0 10*3/uL (ref 0.0–0.1)
EOS%: 0.8 % (ref 0.0–7.0)
Eosinophils Absolute: 0.1 10*3/uL (ref 0.0–0.5)
HCT: 40.7 % (ref 34.8–46.6)
HGB: 13.8 g/dL (ref 11.6–15.9)
LYMPH%: 15.4 % (ref 14.0–49.7)
MCH: 34.3 pg — ABNORMAL HIGH (ref 25.1–34.0)
MCHC: 33.8 g/dL (ref 31.5–36.0)
MCV: 101.3 fL — ABNORMAL HIGH (ref 79.5–101.0)
MONO#: 0.5 10*3/uL (ref 0.1–0.9)
MONO%: 7.3 % (ref 0.0–14.0)
NEUT#: 4.7 10*3/uL (ref 1.5–6.5)
NEUT%: 75.9 % (ref 38.4–76.8)
Platelets: 147 10*3/uL (ref 145–400)
RBC: 4.02 10*6/uL (ref 3.70–5.45)
RDW: 13 % (ref 11.2–14.5)
WBC: 6.2 10*3/uL (ref 3.9–10.3)
lymph#: 1 10*3/uL (ref 0.9–3.3)

## 2011-04-03 LAB — COMPREHENSIVE METABOLIC PANEL
ALT: 17 U/L (ref 0–35)
AST: 24 U/L (ref 0–37)
Creatinine, Ser: 0.97 mg/dL (ref 0.50–1.10)
Total Bilirubin: 0.2 mg/dL — ABNORMAL LOW (ref 0.3–1.2)

## 2011-04-03 LAB — LACTATE DEHYDROGENASE: LDH: 174 U/L (ref 94–250)

## 2011-04-05 ENCOUNTER — Encounter: Payer: Self-pay | Admitting: Oncology

## 2011-04-05 DIAGNOSIS — I10 Essential (primary) hypertension: Secondary | ICD-10-CM

## 2011-04-05 DIAGNOSIS — K529 Noninfective gastroenteritis and colitis, unspecified: Secondary | ICD-10-CM | POA: Insufficient documentation

## 2011-04-05 DIAGNOSIS — R739 Hyperglycemia, unspecified: Secondary | ICD-10-CM

## 2011-04-05 DIAGNOSIS — C50919 Malignant neoplasm of unspecified site of unspecified female breast: Secondary | ICD-10-CM

## 2011-04-05 HISTORY — DX: Noninfective gastroenteritis and colitis, unspecified: K52.9

## 2011-04-05 HISTORY — DX: Malignant neoplasm of unspecified site of unspecified female breast: C50.919

## 2011-04-05 HISTORY — DX: Essential (primary) hypertension: I10

## 2011-04-05 HISTORY — DX: Hyperglycemia, unspecified: R73.9

## 2011-04-05 NOTE — Progress Notes (Signed)
Hematology and Oncology Follow Up Visit  Caitlin Pollard [id number] 1960-06-14 51 y.o. 04/05/2011 11:42 AM   Principle Diagnosis: Encounter Diagnoses  Name Primary?  . Breast cancer, stage 2 Yes  . IBD (inflammatory bowel disease)   . HTN (hypertension), benign   . Hyperglycemia without ketosis      Interim History:  Follow-up visit for this 51  year-old woman initially diagnosed with a Stage II, 0.9 cm, 1 node positive, ER/PR positive, Her-2 negative cancer of the right breast in November 1999.  She underwent mastectomy with immediate reconstruction.  Adjuvant chemotherapy with 4 cycles of Adriamycin/Cytoxan then hormonal therapy with tamoxifen between 04/1998 through 12/2007 then Femara for one year through February 2010.    She is doing well at this time. She's had no interim medical problems. She skipped her GYN visit this year. She has had no vaginal bleeding. She is post hysterectomy. She denies any headache or change in vision. No bone pain. No change in bowel habit.      Medications: reviewed  Allergies:  Allergies  Allergen Reactions  . Seasonal Other (See Comments)    Runny eyes    Review of Systems: Constitutional:   No constitutional symptoms Respiratory: No cough or dyspnea Cardiovascular:  No chest pain or palpitations Gastrointestinal: See above; she recently changed to sulfasalazine for her in laboratory bowel disorder and this is working out well and is much cheaper than the Azulfidine. Genito-Urinary: See above Musculoskeletal: See above Neurologic: See above Skin: Remaining ROS negative.  Physical Exam: Blood pressure 153/105, pulse 94, temperature 97.1 F (36.2 C), temperature source Oral, height 5\' 7"  (1.702 m), weight 139 lb (63.05 kg). Wt Readings from Last 3 Encounters:  04/03/11 139 lb (63.05 kg)     General appearance: A thin Caucasian woman HENNT: Normal Lymph nodes: No adenopathy Breasts: Right breast reconstruction; bilateral breast  implants no cutaneous lesions Lungs: Clear to auscultation resonant to percussion Heart: Regular rhythm no murmur Abdomen: Soft nontender no mass no organomegaly Extremities: No edema no calf tenderness Vascular: No cyanosis Neurologic: No focal deficit Skin: No rash or ecchymosis  Lab Results: Lab Results  Component Value Date   WBC 6.2 04/03/2011   HGB 13.8 04/03/2011   HCT 40.7 04/03/2011   MCV 101.3* 04/03/2011   PLT 147 04/03/2011     Chemistry      Component Value Date/Time   NA 137 04/03/2011 1452   K 3.9 04/03/2011 1452   CL 101 04/03/2011 1452   CO2 27 04/03/2011 1452   BUN 17 04/03/2011 1452   CREATININE 0.97 04/03/2011 1452      Component Value Date/Time   CALCIUM 9.7 04/03/2011 1452   ALKPHOS 59 04/03/2011 1452   AST 24 04/03/2011 1452   ALT 17 04/03/2011 1452   BILITOT 0.2* 04/03/2011 1452    Random Glucose 151 value last year 111   Radiological Studies: Mammogram 12/23/2010. Bilateral saline implants. Dense residual breast tissue on the left but no dominant mass.    Impression and Plan: #1. Stage II T1 N1 M0,  ER/PR positive,  HER-2 negative cancer right breast treated as outlined above. No evidence for new disease now out 13-1/2 years from diagnosis. Plan: Continue annual exam and mammograms.  #2. pre-diabetes. She still does not have a primary care physician. She will need to have her blood sugars checked again soon. Medical intervention if necessary.  #3. Hypertension. At time of visit last year blood pressure had normalized off medication. Pressure is up  again. I will need to contact her about her pressure and her sugars.   CC:.    Levert Feinstein, MD 3/10/201311:42 AM

## 2011-04-10 ENCOUNTER — Other Ambulatory Visit: Payer: Self-pay | Admitting: *Deleted

## 2011-04-10 DIAGNOSIS — C50919 Malignant neoplasm of unspecified site of unspecified female breast: Secondary | ICD-10-CM

## 2011-04-10 MED ORDER — TRIAMTERENE-HCTZ 37.5-25 MG PO CAPS: 1.0000 | ORAL_CAPSULE | Freq: Every morning | ORAL | Status: AC

## 2011-12-31 ENCOUNTER — Ambulatory Visit
Admission: RE | Admit: 2011-12-31 | Discharge: 2011-12-31 | Disposition: A | Discharge status: Home / Self Care | Payer: BC Managed Care – PPO | Source: Ambulatory Visit | Attending: Oncology | Admitting: Oncology

## 2011-12-31 DIAGNOSIS — C50919 Malignant neoplasm of unspecified site of unspecified female breast: Secondary | ICD-10-CM

## 2012-03-17 ENCOUNTER — Telehealth: Payer: Self-pay | Admitting: Oncology

## 2012-04-01 ENCOUNTER — Other Ambulatory Visit: Payer: BC Managed Care – PPO | Admitting: Lab

## 2012-04-08 ENCOUNTER — Ambulatory Visit: Payer: BC Managed Care – PPO | Admitting: Oncology

## 2012-04-15 ENCOUNTER — Ambulatory Visit (HOSPITAL_BASED_OUTPATIENT_CLINIC_OR_DEPARTMENT_OTHER): Payer: BC Managed Care – PPO | Admitting: Oncology

## 2012-04-15 ENCOUNTER — Other Ambulatory Visit (HOSPITAL_BASED_OUTPATIENT_CLINIC_OR_DEPARTMENT_OTHER): Payer: BC Managed Care – PPO | Admitting: Lab

## 2012-04-15 ENCOUNTER — Telehealth: Payer: Self-pay | Admitting: Oncology

## 2012-04-15 VITALS — BP 140/90 | HR 86 | Temp 98.1°F | Resp 20 | Ht 67.0 in | Wt 126.6 lb

## 2012-04-15 DIAGNOSIS — C50911 Malignant neoplasm of unspecified site of right female breast: Secondary | ICD-10-CM

## 2012-04-15 DIAGNOSIS — C50919 Malignant neoplasm of unspecified site of unspecified female breast: Secondary | ICD-10-CM

## 2012-04-15 DIAGNOSIS — Z170 Estrogen receptor positive status [ER+]: Secondary | ICD-10-CM

## 2012-04-15 DIAGNOSIS — R7309 Other abnormal glucose: Secondary | ICD-10-CM

## 2012-04-15 DIAGNOSIS — I10 Essential (primary) hypertension: Secondary | ICD-10-CM

## 2012-04-15 LAB — COMPREHENSIVE METABOLIC PANEL (CC13)
Albumin: 4 g/dL (ref 3.5–5.0)
Alkaline Phosphatase: 56 U/L (ref 40–150)
BUN: 15.3 mg/dL (ref 7.0–26.0)
CO2: 28 mEq/L (ref 22–29)
Calcium: 9.6 mg/dL (ref 8.4–10.4)
Chloride: 102 mEq/L (ref 98–107)
Glucose: 105 mg/dl — ABNORMAL HIGH (ref 70–99)
Potassium: 4.4 mEq/L (ref 3.5–5.1)
Sodium: 139 mEq/L (ref 136–145)
Total Protein: 6.6 g/dL (ref 6.4–8.3)

## 2012-04-15 LAB — CBC WITH DIFFERENTIAL/PLATELET
Basophils Absolute: 0 10*3/uL (ref 0.0–0.1)
EOS%: 1 % (ref 0.0–7.0)
Eosinophils Absolute: 0.1 10*3/uL (ref 0.0–0.5)
HCT: 42.6 % (ref 34.8–46.6)
HGB: 14.6 g/dL (ref 11.6–15.9)
MCH: 34.7 pg — ABNORMAL HIGH (ref 25.1–34.0)
MCV: 101.1 fL — ABNORMAL HIGH (ref 79.5–101.0)
NEUT%: 67.4 % (ref 38.4–76.8)
lymph#: 1.3 10*3/uL (ref 0.9–3.3)

## 2012-04-15 NOTE — Telephone Encounter (Signed)
, °

## 2012-04-16 NOTE — Progress Notes (Signed)
Hematology and Oncology Follow Up Visit  Caitlin Pollard [id number] Nov 21, 1960 52 y.o. 04/16/2012 1:03 PM   Principle Diagnosis: Encounter Diagnosis  Name Primary?  . Breast cancer, stage 2, right Yes     Interim History:   Follow-up visit for this 52  year-old woman initially diagnosed with a Stage II, 0.9 cm, 1 node positive, ER/PR positive, Her-2 negative cancer of the right breast in November 1999. She underwent mastectomy with immediate reconstruction. Adjuvant chemotherapy with 4 cycles of Adriamycin/Cytoxan then hormonal therapy with tamoxifen between 04/1998 through 12/2007 then Femara for one year through February 2010.  Medically she is doing well but she has had a number of family rub lungs. She just lost her mother in January following complications from a umbilicus hernia repair done in a small hospital in [facility]. Her husband has retired. Her children are doing well. Her 48 year old daughter just accepted into a position assistance program.  She had a colonoscopy just a few weeks ago and was told it looked better than it has in the past. The medication she uses for her chronic inflammatory bowel disorder was therefore decreased. She reports no bone pain, no vaginal bleeding. She has not had a pelvic exam in 2 years. Her OB/GYN just retired. She has not established with a new doctor yet.   Medications: reviewed  Allergies:  Allergies  Allergen Reactions  . Cholestatin Other (See Comments)    Runny eyes--pt. Denies this allergy    Review of Systems: Constitutional:  No constitutional symptoms  Respiratory: No cough or dyspnea Cardiovascular: No chest pain or palpitations  Gastrointestinal: See above Genito-Urinary: See above Musculoskeletal: See above Neurologic: No headache or change in vision Skin: No rash Remaining ROS negative.  Physical Exam: Blood pressure 140/90, pulse 86, temperature 98.1 F (36.7 C), temperature source Oral, resp. rate 20,  height 5\' 7"  (1.702 m), weight 126 lb 9.6 oz (57.425 kg). Wt Readings from Last 3 Encounters:  04/15/12 126 lb 9.6 oz (57.425 kg)  04/03/11 139 lb (63.05 kg)     General appearance: Thin Caucasian woman HENNT: Normal Lymph nodes: No adenopathy Breasts: Left breast reconstruction with saline expander. No cutaneous lesions. No left breast masses area Lungs: Clear to auscultation resonant to percussion Heart: Regular rhythm no murmur Abdomen: Soft nontender no mass no organomegaly Extremities: No edema, no calf tenderness Vascular: No cyanosis Neurologic: No focal deficit Skin: No rash  Lab Results: Lab Results  Component Value Date   WBC 6.2 04/15/2012   HGB 14.6 04/15/2012   HCT 42.6 04/15/2012   MCV 101.1* 04/15/2012   PLT 143* 04/15/2012     Chemistry      Component Value Date/Time   NA 139 04/15/2012 1230   NA 137 04/03/2011 1452   K 4.4 04/15/2012 1230   K 3.9 04/03/2011 1452   CL 102 04/15/2012 1230   CL 101 04/03/2011 1452   CO2 28 04/15/2012 1230   CO2 27 04/03/2011 1452   BUN 15.3 04/15/2012 1230   BUN 17 04/03/2011 1452   CREATININE 0.8 04/15/2012 1230   CREATININE 0.97 04/03/2011 1452      Component Value Date/Time   CALCIUM 9.6 04/15/2012 1230   CALCIUM 9.7 04/03/2011 1452   ALKPHOS 56 04/15/2012 1230   ALKPHOS 59 04/03/2011 1452   AST 34 04/15/2012 1230   AST 24 04/03/2011 1452   ALT 33 04/15/2012 1230   ALT 17 04/03/2011 1452   BILITOT 0.47 04/15/2012 1230   BILITOT 0.2*  04/03/2011 1452       Radiological Studies: No results found.  Impression and Plan:  #1. Stage II T1 N1 M0, ER/PR positive, HER-2 negative cancer right breast treated as outlined above. No evidence for new disease now out over 14 years from diagnosis.  Plan: Continue annual exam and mammograms.   #2. pre-diabetes.  She did get establish with [facility] and is primarily seeing a nurse practitioner Tynisa Vohs Random glucose to share better than last year.  #3. Hypertension. Blood pressure  borderline but better than last year. Not currently on an antihypertensive.  #4. Low grade inflammatory bowel disease controlled on current medication.  CC:. Dr. Cyndia Bent; Billee Cashing; Dr. Leary Roca   Levert Feinstein, MD 3/22/20141:03 PM

## 2012-04-19 ENCOUNTER — Telehealth: Payer: Self-pay | Admitting: *Deleted

## 2012-04-19 NOTE — Telephone Encounter (Signed)
Called pt and relayed Dr. Timoteo Expose message below.  She verbalized understanding.

## 2012-04-19 NOTE — Telephone Encounter (Signed)
Message copied by Wende Mott on Tue Apr 19, 2012  4:51 PM ------      Message from: Orbie Hurst      Created: Tue Apr 19, 2012  4:44 PM                   ----- Message -----         From: Levert Feinstein, MD         Sent: 04/15/2012   8:30 PM           To: Orbie Hurst, RN, Sabino Snipes, RN, #            Call pt blood chemistry normal ------

## 2013-01-02 ENCOUNTER — Ambulatory Visit
Admission: RE | Admit: 2013-01-02 | Discharge: 2013-01-02 | Disposition: A | Discharge status: Home / Self Care | Payer: BC Managed Care – PPO | Source: Ambulatory Visit | Attending: Oncology | Admitting: Oncology

## 2013-01-02 DIAGNOSIS — C50911 Malignant neoplasm of unspecified site of right female breast: Secondary | ICD-10-CM

## 2013-03-25 ENCOUNTER — Encounter: Payer: Self-pay | Admitting: Oncology

## 2013-04-11 ENCOUNTER — Ambulatory Visit (HOSPITAL_BASED_OUTPATIENT_CLINIC_OR_DEPARTMENT_OTHER): Payer: BC Managed Care – PPO | Admitting: Oncology

## 2013-04-11 ENCOUNTER — Other Ambulatory Visit (HOSPITAL_BASED_OUTPATIENT_CLINIC_OR_DEPARTMENT_OTHER): Payer: BC Managed Care – PPO

## 2013-04-11 ENCOUNTER — Telehealth: Payer: Self-pay | Admitting: Oncology

## 2013-04-11 VITALS — BP 163/95 | HR 84 | Temp 98.0°F | Resp 20 | Ht 67.0 in | Wt 135.4 lb

## 2013-04-11 DIAGNOSIS — Z853 Personal history of malignant neoplasm of breast: Secondary | ICD-10-CM

## 2013-04-11 DIAGNOSIS — K5289 Other specified noninfective gastroenteritis and colitis: Secondary | ICD-10-CM

## 2013-04-11 DIAGNOSIS — F17200 Nicotine dependence, unspecified, uncomplicated: Secondary | ICD-10-CM

## 2013-04-11 DIAGNOSIS — C50919 Malignant neoplasm of unspecified site of unspecified female breast: Secondary | ICD-10-CM

## 2013-04-11 DIAGNOSIS — I10 Essential (primary) hypertension: Secondary | ICD-10-CM

## 2013-04-11 DIAGNOSIS — C50911 Malignant neoplasm of unspecified site of right female breast: Secondary | ICD-10-CM

## 2013-04-11 LAB — CBC WITH DIFFERENTIAL/PLATELET
BASO%: 1.1 % (ref 0.0–2.0)
Basophils Absolute: 0.1 10*3/uL (ref 0.0–0.1)
EOS%: 1.1 % (ref 0.0–7.0)
Eosinophils Absolute: 0.1 10*3/uL (ref 0.0–0.5)
HEMATOCRIT: 44.2 % (ref 34.8–46.6)
HGB: 14.7 g/dL (ref 11.6–15.9)
LYMPH%: 14.1 % (ref 14.0–49.7)
MCH: 33.8 pg (ref 25.1–34.0)
MCHC: 33.3 g/dL (ref 31.5–36.0)
MCV: 101.5 fL — ABNORMAL HIGH (ref 79.5–101.0)
MONO#: 0.5 10*3/uL (ref 0.1–0.9)
MONO%: 7.5 % (ref 0.0–14.0)
NEUT#: 5.1 10*3/uL (ref 1.5–6.5)
NEUT%: 76.2 % (ref 38.4–76.8)
PLATELETS: 168 10*3/uL (ref 145–400)
RBC: 4.35 10*6/uL (ref 3.70–5.45)
RDW: 13.1 % (ref 11.2–14.5)
WBC: 6.6 10*3/uL (ref 3.9–10.3)
lymph#: 0.9 10*3/uL (ref 0.9–3.3)

## 2013-04-11 LAB — COMPREHENSIVE METABOLIC PANEL (CC13)
ALT: 23 U/L (ref 0–55)
ANION GAP: 11 meq/L (ref 3–11)
AST: 26 U/L (ref 5–34)
Albumin: 4.4 g/dL (ref 3.5–5.0)
Alkaline Phosphatase: 63 U/L (ref 40–150)
BILIRUBIN TOTAL: 0.61 mg/dL (ref 0.20–1.20)
BUN: 12 mg/dL (ref 7.0–26.0)
CO2: 26 meq/L (ref 22–29)
CREATININE: 0.7 mg/dL (ref 0.6–1.1)
Calcium: 9.9 mg/dL (ref 8.4–10.4)
Chloride: 102 mEq/L (ref 98–109)
Glucose: 131 mg/dl (ref 70–140)
Potassium: 4.8 mEq/L (ref 3.5–5.1)
Sodium: 139 mEq/L (ref 136–145)
Total Protein: 6.8 g/dL (ref 6.4–8.3)

## 2013-04-11 NOTE — Progress Notes (Signed)
Hematology and Oncology Follow Up Visit  Caitlin Pollard [id number] January 26, 1961 53 y.o. 04/11/2013 6:56 PM   Principle Diagnosis: Encounter Diagnosis  Name Primary?  . Breast cancer, stage 2 Yes     Interim History:   Follow-up visit for this 53 year-old woman initially diagnosed with a Stage II, 0.9 cm, 1 node positive, ER/PR positive, Her-2 negative cancer of the right breast in November 1999. She underwent mastectomy with immediate reconstruction. Adjuvant chemotherapy with 4 cycles of Adriamycin/Cytoxan then hormonal therapy with tamoxifen between 04/1998 through 12/2007 then Femara for one year through February 2010. She remains free of any obvious recurrence at this time. Most recent mammograms done in 01/02/2013 showed no new disease. She denies any headache, bone pain, vaginal bleeding.  She continues to smoke. Her family is doing well. Her 3 children are now grown. The youngest is 66 years old and going to [place].   Medications: reviewed  Allergies:  Allergies  Allergen Reactions  . Cholestatin Other (See Comments)    Runny eyes--pt. Denies this allergy    Review of Systems: Hematology:  No bleeding or bruising ENT ROS: No sore throat Breast ROS:  Respiratory ROS: No cough or dyspnea Cardiovascular ROS: No chest pain or palpitations  Gastrointestinal ROS: Inflammatory bowel disorder has been under very good control and she is off all chronic medication for this. Most recent colonoscopy done about a year ago when she was told there were minimal inflammatory changes.   Genito-Urinary ROS: See above Musculoskeletal ROS: No muscle bone or joint pain Neurological ROS: See above Dermatological ROS: No rash or ecchymosis. Remaining ROS negative:   Physical Exam: Blood pressure 163/95, pulse 84, temperature 98 F (36.7 C), temperature source Oral, resp. rate 20, height 5' 7"  (1.702 m), weight 135 lb 6.4 oz (61.417 kg), SpO2 98.00%. Wt Readings from Last 3 Encounters:   04/11/13 135 lb 6.4 oz (61.417 kg)  04/15/12 126 lb 9.6 oz (57.425 kg)  04/03/11 139 lb (63.05 kg)     General appearance: Well-nourished Caucasian woman HENNT: Pharynx no erythema, exudate, mass, or ulcer. No thyromegaly or thyroid nodules Lymph nodes: No cervical, supraclavicular, or axillary lymphadenopathy Breasts: No abnormal skin changes, right TRAM reconstruction. no mass in the left breast. Saline implant. Lungs: Clear to auscultation, resonant to percussion throughout. Scattered wheezes. Heart: Regular rhythm, no murmur, no gallop, no rub, no click, no edema Abdomen: Soft, nontender, normal bowel sounds, no mass, no organomegaly Extremities: No edema, no calf tenderness Musculoskeletal: no joint deformities GU:  Vascular: Carotid pulses 2+, no bruits,  Neurologic: Alert, oriented, PERRLA,  cranial nerves grossly normal, motor strength 5 over 5, reflexes 1+ symmetric, upper body coordination normal, gait normal, Skin: No rash or ecchymosis  Lab Results: CBC W/Diff    Component Value Date/Time   WBC 6.6 04/11/2013 1041   RBC 4.35 04/11/2013 1041   HGB 14.7 04/11/2013 1041   HGB 15.0 03/29/2007 0645   HCT 44.2 04/11/2013 1041   PLT 168 04/11/2013 1041   MCV 101.5* 04/11/2013 1041   MCH 33.8 04/11/2013 1041   MCH 33.2 02/04/2010 1328   MCHC 33.3 04/11/2013 1041   RDW 13.1 04/11/2013 1041   LYMPHSABS 0.9 04/11/2013 1041   MONOABS 0.5 04/11/2013 1041   EOSABS 0.1 04/11/2013 1041   BASOSABS 0.1 04/11/2013 1041     Chemistry      Component Value Date/Time   NA 139 04/11/2013 1041   NA 137 04/03/2011 1452   K 4.8 04/11/2013 1041  K 3.9 04/03/2011 1452   CL 102 04/15/2012 1230   CL 101 04/03/2011 1452   CO2 26 04/11/2013 1041   CO2 27 04/03/2011 1452   BUN 12.0 04/11/2013 1041   BUN 17 04/03/2011 1452   CREATININE 0.7 04/11/2013 1041   CREATININE 0.97 04/03/2011 1452      Component Value Date/Time   CALCIUM 9.9 04/11/2013 1041   CALCIUM 9.7 04/03/2011 1452   ALKPHOS 63 04/11/2013 1041    ALKPHOS 59 04/03/2011 1452   AST 26 04/11/2013 1041   AST 24 04/03/2011 1452   ALT 23 04/11/2013 1041   ALT 17 04/03/2011 1452   BILITOT 0.61 04/11/2013 1041   BILITOT 0.2* 04/03/2011 1452       Radiological Studies: See discussion above   Impression:  #1. Stage II, 1 node positive, ER positive, HER-2-negative, cancer of the right breast treated as outlined above. She remains free of any recurrence now out over 15 years. She feels comfortable graduating from our practice at this time. I didn't schedule her next mammogram which is due in December of 2015 and indicated that the reports be sent to her primary care physician. She is currently being [facility] under Dr. Anastasia Pall and is receiving excellent care from physician assistant Fulton Mole.  #2. Low-grade inflammatory bowel disorder  #3. Tobacco addiction-she states she is trying to cut back.  #4. Essential hypertension   CC: Patient Care Team: Aura Dials as PCP - General (Physician Assistant)   Annia Belt, MD 3/17/20156:56 PM

## 2013-04-11 NOTE — Telephone Encounter (Signed)
gv adn printed appt sched for Dec...pt prn per Dr. Darnell Level pof 3.17.15

## 2014-01-04 ENCOUNTER — Ambulatory Visit
Admission: RE | Admit: 2014-01-04 | Discharge: 2014-01-04 | Disposition: A | Discharge status: Home / Self Care | Payer: BC Managed Care – PPO | Source: Ambulatory Visit | Attending: Physician Assistant | Admitting: Physician Assistant

## 2014-01-04 ENCOUNTER — Other Ambulatory Visit: Payer: Self-pay | Admitting: Physician Assistant

## 2014-01-04 ENCOUNTER — Ambulatory Visit
Admission: RE | Admit: 2014-01-04 | Discharge: 2014-01-04 | Disposition: A | Discharge status: Home / Self Care | Payer: BC Managed Care – PPO | Source: Ambulatory Visit | Attending: Oncology | Admitting: Oncology

## 2014-01-04 DIAGNOSIS — Z9011 Acquired absence of right breast and nipple: Secondary | ICD-10-CM

## 2014-01-04 DIAGNOSIS — Z853 Personal history of malignant neoplasm of breast: Secondary | ICD-10-CM

## 2014-01-04 DIAGNOSIS — Z1231 Encounter for screening mammogram for malignant neoplasm of breast: Secondary | ICD-10-CM

## 2015-03-21 ENCOUNTER — Other Ambulatory Visit: Payer: Self-pay | Admitting: Physician Assistant

## 2015-03-22 ENCOUNTER — Other Ambulatory Visit: Payer: Self-pay | Admitting: Physician Assistant

## 2015-03-22 DIAGNOSIS — E2839 Other primary ovarian failure: Secondary | ICD-10-CM

## 2015-03-22 DIAGNOSIS — Z1231 Encounter for screening mammogram for malignant neoplasm of breast: Secondary | ICD-10-CM

## 2015-04-29 ENCOUNTER — Other Ambulatory Visit: Payer: Self-pay | Admitting: Physician Assistant

## 2015-04-29 ENCOUNTER — Ambulatory Visit
Admission: RE | Admit: 2015-04-29 | Discharge: 2015-04-29 | Disposition: A | Discharge status: Home / Self Care | Payer: BLUE CROSS/BLUE SHIELD | Source: Ambulatory Visit | Attending: Physician Assistant | Admitting: Physician Assistant

## 2015-04-29 DIAGNOSIS — Z1231 Encounter for screening mammogram for malignant neoplasm of breast: Secondary | ICD-10-CM

## 2015-04-29 DIAGNOSIS — E2839 Other primary ovarian failure: Secondary | ICD-10-CM

## 2017-04-20 ENCOUNTER — Other Ambulatory Visit: Payer: Self-pay | Admitting: Physician Assistant

## 2017-04-20 DIAGNOSIS — Z853 Personal history of malignant neoplasm of breast: Secondary | ICD-10-CM

## 2017-05-10 ENCOUNTER — Ambulatory Visit
Admission: RE | Admit: 2017-05-10 | Discharge: 2017-05-10 | Disposition: A | Discharge status: Home / Self Care | Payer: BLUE CROSS/BLUE SHIELD | Source: Ambulatory Visit | Attending: Physician Assistant | Admitting: Physician Assistant

## 2017-05-10 DIAGNOSIS — Z853 Personal history of malignant neoplasm of breast: Secondary | ICD-10-CM

## 2017-05-10 HISTORY — DX: Malignant neoplasm of unspecified site of unspecified female breast: C50.919

## 2017-11-11 ENCOUNTER — Inpatient Hospital Stay: Payer: BLUE CROSS/BLUE SHIELD | Attending: Oncology

## 2017-11-11 ENCOUNTER — Inpatient Hospital Stay (HOSPITAL_BASED_OUTPATIENT_CLINIC_OR_DEPARTMENT_OTHER): Payer: BLUE CROSS/BLUE SHIELD | Admitting: Oncology

## 2017-11-11 ENCOUNTER — Telehealth: Payer: Self-pay | Admitting: Oncology

## 2017-11-11 DIAGNOSIS — Z853 Personal history of malignant neoplasm of breast: Secondary | ICD-10-CM | POA: Insufficient documentation

## 2017-11-11 DIAGNOSIS — C787 Secondary malignant neoplasm of liver and intrahepatic bile duct: Secondary | ICD-10-CM | POA: Insufficient documentation

## 2017-11-11 DIAGNOSIS — K769 Liver disease, unspecified: Secondary | ICD-10-CM | POA: Insufficient documentation

## 2017-11-11 DIAGNOSIS — C221 Intrahepatic bile duct carcinoma: Secondary | ICD-10-CM | POA: Insufficient documentation

## 2017-11-11 DIAGNOSIS — Z720 Tobacco use: Secondary | ICD-10-CM

## 2017-11-11 DIAGNOSIS — C7951 Secondary malignant neoplasm of bone: Secondary | ICD-10-CM | POA: Insufficient documentation

## 2017-11-11 DIAGNOSIS — K831 Obstruction of bile duct: Secondary | ICD-10-CM | POA: Diagnosis not present

## 2017-11-11 DIAGNOSIS — F418 Other specified anxiety disorders: Secondary | ICD-10-CM | POA: Insufficient documentation

## 2017-11-11 DIAGNOSIS — J42 Unspecified chronic bronchitis: Secondary | ICD-10-CM

## 2017-11-11 DIAGNOSIS — Z87891 Personal history of nicotine dependence: Secondary | ICD-10-CM | POA: Diagnosis not present

## 2017-11-11 DIAGNOSIS — G893 Neoplasm related pain (acute) (chronic): Secondary | ICD-10-CM | POA: Diagnosis not present

## 2017-11-11 DIAGNOSIS — Z170 Estrogen receptor positive status [ER+]: Secondary | ICD-10-CM

## 2017-11-11 DIAGNOSIS — I10 Essential (primary) hypertension: Secondary | ICD-10-CM | POA: Insufficient documentation

## 2017-11-11 DIAGNOSIS — C50811 Malignant neoplasm of overlapping sites of right female breast: Secondary | ICD-10-CM | POA: Insufficient documentation

## 2017-11-11 DIAGNOSIS — M545 Low back pain: Secondary | ICD-10-CM

## 2017-11-11 LAB — CBC WITH DIFFERENTIAL/PLATELET
ABS IMMATURE GRANULOCYTES: 0.1 10*3/uL — AB (ref 0.00–0.07)
BASOS ABS: 0.1 10*3/uL (ref 0.0–0.1)
Basophils Relative: 0 %
EOS ABS: 0.1 10*3/uL (ref 0.0–0.5)
Eosinophils Relative: 1 %
HCT: 37.8 % (ref 36.0–46.0)
HEMOGLOBIN: 12.8 g/dL (ref 12.0–15.0)
IMMATURE GRANULOCYTES: 1 %
LYMPHS ABS: 0.8 10*3/uL (ref 0.7–4.0)
LYMPHS PCT: 5 %
MCH: 32.7 pg (ref 26.0–34.0)
MCHC: 33.9 g/dL (ref 30.0–36.0)
MCV: 96.4 fL (ref 80.0–100.0)
Monocytes Absolute: 1.7 10*3/uL — ABNORMAL HIGH (ref 0.1–1.0)
Monocytes Relative: 10 %
NEUTROS ABS: 14.8 10*3/uL — AB (ref 1.7–7.7)
NRBC: 0 % (ref 0.0–0.2)
Neutrophils Relative %: 83 %
Platelets: 305 10*3/uL (ref 150–400)
RBC: 3.92 MIL/uL (ref 3.87–5.11)
RDW: 12.2 % (ref 11.5–15.5)
WBC: 17.6 10*3/uL — ABNORMAL HIGH (ref 4.0–10.5)

## 2017-11-11 LAB — COMPREHENSIVE METABOLIC PANEL
ALBUMIN: 3.3 g/dL — AB (ref 3.5–5.0)
ALK PHOS: 1553 U/L — AB (ref 38–126)
ALT: 260 U/L — ABNORMAL HIGH (ref 0–44)
ANION GAP: 13 (ref 5–15)
AST: 315 U/L (ref 15–41)
BUN: 12 mg/dL (ref 6–20)
CALCIUM: 10.1 mg/dL (ref 8.9–10.3)
CHLORIDE: 92 mmol/L — AB (ref 98–111)
CO2: 28 mmol/L (ref 22–32)
Creatinine, Ser: 0.72 mg/dL (ref 0.44–1.00)
GFR calc Af Amer: >60 mL/min (ref >60)
GFR calc non Af Amer: >60 mL/min (ref >60)
GLUCOSE: 109 mg/dL — AB (ref 70–99)
Potassium: 4.5 mmol/L (ref 3.5–5.1)
SODIUM: 133 mmol/L — AB (ref 135–145)
Total Bilirubin: 3 mg/dL — ABNORMAL HIGH (ref 0.3–1.2)
Total Protein: 7.3 g/dL (ref 6.5–8.1)

## 2017-11-11 NOTE — Telephone Encounter (Signed)
I received a call from Caitlin Pollard. Caitlin Pollard from [facility] for metastatic disease. Caitlin Pollard is a former pt of Dr. Azucena Freed, last seen in 2015 for breast cancer. Pt has been cld and scheduled to see Dr. Jana Hakim today at 4pm and to arrive 30 minutes early for labs. I notified Roe Coombs of the pt's appt.

## 2017-11-11 NOTE — Progress Notes (Signed)
[name]  [phone] [phone]     ID: Caitlin Pollard DOB: [birth year]  MR#: [id number]  DDU#:[id number]  Patient Care Team: Chesley Noon, MD as PCP - General (Family Medicine) Annia Belt, MD as Consulting Physician (Oncology) Clarene Essex, MD as Consulting Physician (Gastroenterology) Karessa Onorato, Virgie Dad, MD as Consulting Physician (Oncology) Selinda Orion as Physician Assistant (Physician Assistant) OTHER MD:  CHIEF COMPLAINT: Breast cancer, estrogen receptor positive; liver lesions  CURRENT TREATMENT: Work-up in progress   HISTORY OF CURRENT ILLNESS: The patient used to follow-up with Dr. Beryle Beams who last saw her 04/11/2013.  At that time his note states that she underwent right mastectomy in 1999 with immediate reconstruction for a 0.9 cm estrogen and progesterone receptor positive, HER-2 negative breast cancer with one positive lymph node.  She received adjuvant chemotherapy with doxorubicin and cyclophosphamide x4 and then tamoxifen between April 2000 and December 2009, then letrozole for 1 year.  The patient was released from follow-up at that time.  [facility] showing the breast density to be category D.  There were no findings suspicious for malignancy.  In addition the patient underwent ERCP at [facility] on 10/19/2003 showing a mass in the pancreatic tail, T2 N0 MX by endoscopy, with negative liver findings at that time.  Cytology showed only acinar epithelium and debris. The lesion was subsequently not demonstrable, with MRI of the pancreas 06/06/2004 showing no pancreatic tail mass.  It was felt to have likely been a phlegmon. Also at that time there was no evidence of intra-abdominal metastatic disease.  More recently she was seen by her family practitioner 11/02/2017 for bronchitis.  She was started on Zithromax and Tussionex.  She also was given a Medrol Dosepak.   At the time the patient complained of back problems but there is no mention of abdominal discomfort.  Abdominal exam was benign.  However on 11/11/2017 she underwent complete abdominal ultrasound for evaluation of right upper quadrant pain.  This showed multiple liver lesions suspicious for metastatic disease, and a borderline enlarged peripancreatic lymph node.  The gallbladder wall was thickened.  There was no ascites.  The patient's subsequent history is as detailed below.  INTERVAL HISTORY: Caitlin Pollard was evaluated in the breast cancer clinic on 11/11/2017 accompanied by her husband, Caitlin Pollard.     REVIEW OF SYSTEMS: She was on vacation when she noticed intense back  pain. She also had bilateral rib pain. She has had occasional vomiting over the last 6 months. She has 3-4 glasses of white wine per day, and her husband is concerned that this could be the reason for the ultrasound abnormalities. She notes that she had about 8-10 lymph nodes taken out at her breast cancer that were all clear. She denies unusual headaches or migraines, visual changes, or dizziness. There has been no change in her usual mild cough and phlegm production.  Sometimes when she takes a deep breath she gets a pain in her upper right back.  There has been no change in bowel or bladder habits. She denies unexplained fatigue or unexplained weight loss, bleeding, rash, or fever. A detailed review of systems was otherwise stable.      PAST MEDICAL HISTORY: Past Medical History:  Diagnosis Date  . Breast cancer ([place])   . Breast cancer, stage 2 ([place]) 04/05/2011  . HTN (hypertension), benign 04/05/2011  . Hyperglycemia without ketosis 04/05/2011  . IBD (inflammatory bowel disease) 04/05/2011  PAST SURGICAL HISTORY: Right Wrist surgery Right Mastectomy with reconstruction under Dr. Stephanie Coup with saline implants Tonsillectomy Total hysterectomy with BSO age 57 under Dr. Ubaldo Glassing  FAMILY HISTORY No family history on file. The patient's  father died at age 48 due to lung cancer (remote smoker/ Museum/gallery exhibitions officer with history of asbestos exposure). The patient's mother died at age 47 due to complications after hernia surgery. The patient has 1 brother in good health, no sisters. The patient denies a family history of breast, ovarian, uterine, prostate, or colon cancer. She notes that she was no genetically tested after her initial breast cancer diagnosis at age 57.   GYNECOLOGIC HISTORY:  No LMP recorded. Patient is postmenopausal. Menarche: 57 years old Age at first live birth: 57 years old She is GX P3.  She is status post total hysterectomy with BSO at age 58. She did not use HRT.     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 Her husband, Caitlin Pollard is retired from being an Medical illustrator. The patient's oldest daughter is Tenny Craw age 10, who is a homemaker and lives in [place]. The patient's second daughter, Carolan Shiver "Gilford Rile" Feely age 50 is an emergency department P.A. And lives in [place]. The patient's youngest daughter, Altair age 79 works in Scientist, research (medical) and lives in [place]. 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     ADVANCED DIRECTIVES: not in place   HEALTH MAINTENANCE: Social History   Tobacco Use  . Smoking status: Not on file  Substance Use Topics  . Alcohol use: Not on file  . Drug use: Not on file  She smoked less than a pack per day for 25 years. She quit in 2015.    Colonoscopy: Due   PAP:  [facility] T score -1.9.   Allergies  Allergen Reactions  . Cholestatin Other (See Comments)    Runny eyes--pt. Denies this allergy    Current Outpatient Medications  Medication Sig Dispense Refill  . calcium citrate-vitamin D 200-200 MG-UNIT TABS Take 2 tablets by mouth daily.     . cholecalciferol (VITAMIN D) 1000 UNITS tablet Take 2,000 Units by mouth daily.    . cyclobenzaprine (FLEXERIL) 10 MG tablet Take by mouth.    Marland Kitchen  FLUoxetine (PROZAC) 10 MG capsule One - two tablet a day as needed    . losartan (COZAAR) 100 MG tablet TAKE ONE TABLET BY MOUTH ONCE DAILY    . ibuprofen (ADVIL,MOTRIN) 200 MG tablet Take 200 mg by mouth every 6 (six) hours as needed.    . Multiple Vitamins-Minerals (HAIR/SKIN/NAILS/BIOTIN PO) Take 1 tablet by mouth daily.    . vitamin C (ASCORBIC ACID) 500 MG tablet Take 1,000 mg by mouth daily.     No current facility-administered medications for this visit.     OBJECTIVE: Middle-aged white woman who was tearful during today's visit  Vitals:   11/11/17 1551  BP: (!) 170/106  Pulse: 92  Resp: 18  Temp: 98.8 F (37.1 C)  SpO2: 100%     Body mass index is 21.82 kg/m.   Wt Readings from Last 3 Encounters:  11/11/17 139 lb 4.8 oz (63.2 kg)  04/11/13 135 lb 6.4 oz (61.4 kg)  04/15/12 126 lb 9.6 oz (57.4 kg)      ECOG FS:1 - Symptomatic but completely ambulatory  Ocular: Sclerae unicteric, pupils round and equal, EOMs intact Ear-nose-throat: Oropharynx clear and moist Lymphatic: No cervical or supraclavicular adenopathy Lungs  no rales or rhonchi Heart regular rate and rhythm Abd soft, nontender, positive bowel sounds; deep palpation right upper quadrant elicits absolutely no tenderness MSK no focal spinal tenderness to careful palpation, no joint edema Neuro: non-focal, well-oriented, appropriate affect Breasts: The right breast is status post mastectomy with saline implant reconstruction.  There is no evidence of local recurrence.  The left breast is status post augmentation with saline implant.  There are no suspicious findings.  Both axillae are benign.   LAB RESULTS:  CMP     Component Value Date/Time   NA 133 (L) 11/11/2017 1543   NA 139 04/11/2013 1041   K 4.5 11/11/2017 1543   K 4.8 04/11/2013 1041   CL 92 (L) 11/11/2017 1543   CL 102 04/15/2012 1230   CO2 28 11/11/2017 1543   CO2 26 04/11/2013 1041   GLUCOSE 109 (H) 11/11/2017 1543   GLUCOSE 131 04/11/2013  1041   GLUCOSE 105 (H) 04/15/2012 1230   BUN 12 11/11/2017 1543   BUN 12.0 04/11/2013 1041   CREATININE 0.72 11/11/2017 1543   CREATININE 0.7 04/11/2013 1041   CALCIUM 10.1 11/11/2017 1543   CALCIUM 9.9 04/11/2013 1041   PROT 7.3 11/11/2017 1543   PROT 6.8 04/11/2013 1041   ALBUMIN 3.3 (L) 11/11/2017 1543   ALBUMIN 4.4 04/11/2013 1041   AST 315 (HH) 11/11/2017 1543   AST 26 04/11/2013 1041   ALT 260 (H) 11/11/2017 1543   ALT 23 04/11/2013 1041   ALKPHOS 1,553 (H) 11/11/2017 1543   ALKPHOS 63 04/11/2013 1041   BILITOT 3.0 (H) 11/11/2017 1543   BILITOT 0.61 04/11/2013 1041   GFRNONAA >60 11/11/2017 1543   GFRAA >60 11/11/2017 1543    No results found for: TOTALPROTELP, ALBUMINELP, A1GS, A2GS, BETS, BETA2SER, GAMS, MSPIKE, SPEI  No results found for: KPAFRELGTCHN, LAMBDASER, KAPLAMBRATIO  Lab Results  Component Value Date   WBC 17.6 (H) 11/11/2017   NEUTROABS 14.8 (H) 11/11/2017   HGB 12.8 11/11/2017   HCT 37.8 11/11/2017   MCV 96.4 11/11/2017   PLT 305 11/11/2017    @LASTCHEMISTRY @  No results found for: LABCA2  No components found for: [id number]  No results for input(s): INR in the last 168 hours.  No results found for: LABCA2  No results found for: WJX914  No results found for: NWG956  No results found for: OZH086  No results found for: CA2729  No components found for: HGQUANT  No results found for: CEA1 / No results found for: CEA1   No results found for: AFPTUMOR  No results found for: CHROMOGRNA  No results found for: PSA1  Appointment on 11/11/2017  Component Date Value Ref Range Status  . Sodium 11/11/2017 133* 135 - 145 mmol/L Final  . Potassium 11/11/2017 4.5  3.5 - 5.1 mmol/L Final  . Chloride 11/11/2017 92* 98 - 111 mmol/L Final  . CO2 11/11/2017 28  22 - 32 mmol/L Final  . Glucose, Bld 11/11/2017 109* 70 - 99 mg/dL Final  . BUN 11/11/2017 12  6 - 20 mg/dL Final  . Creatinine, Ser 11/11/2017 0.72  0.44 - 1.00 mg/dL Final  . Calcium  11/11/2017 10.1  8.9 - 10.3 mg/dL Final  . Total Protein 11/11/2017 7.3  6.5 - 8.1 g/dL Final  . Albumin 11/11/2017 3.3* 3.5 - 5.0 g/dL Final  . AST 11/11/2017 315* 15 - 41 U/L Final   CRITICAL RESULT CALLED TO, READ BACK BY AND VERIFIED WITH: VAL   . ALT 11/11/2017 260* 0 -  44 U/L Final  . Alkaline Phosphatase 11/11/2017 1,553* 38 - 126 U/L Final  . Total Bilirubin 11/11/2017 3.0* 0.3 - 1.2 mg/dL Final  . GFR calc non Af Amer 11/11/2017 >60  >60 mL/min Final  . GFR calc Af Amer 11/11/2017 >60  >60 mL/min Final   Comment: (NOTE) The eGFR has been calculated using the CKD EPI equation. This calculation has not been validated in all clinical situations. eGFR's persistently <60 mL/min signify possible Chronic Kidney Disease.   Georgiann Hahn gap 11/11/2017 13  5 - 15 Final   Performed at [facility], [place] [address], [place], [place] [address]  . WBC 11/11/2017 17.6* 4.0 - 10.5 K/uL Final  . RBC 11/11/2017 3.92  3.87 - 5.11 MIL/uL Final  . Hemoglobin 11/11/2017 12.8  12.0 - 15.0 g/dL Final  . HCT 11/11/2017 37.8  36.0 - 46.0 % Final  . MCV 11/11/2017 96.4  80.0 - 100.0 fL Final  . MCH 11/11/2017 32.7  26.0 - 34.0 pg Final  . MCHC 11/11/2017 33.9  30.0 - 36.0 g/dL Final  . RDW 11/11/2017 12.2  11.5 - 15.5 % Final  . Platelets 11/11/2017 305  150 - 400 K/uL Final  . nRBC 11/11/2017 0.0  0.0 - 0.2 % Final  . Neutrophils Relative % 11/11/2017 83  % Final  . Neutro Abs 11/11/2017 14.8* 1.7 - 7.7 K/uL Final  . Lymphocytes Relative 11/11/2017 5  % Final  . Lymphs Abs 11/11/2017 0.8  0.7 - 4.0 K/uL Final  . Monocytes Relative 11/11/2017 10  % Final  . Monocytes Absolute 11/11/2017 1.7* 0.1 - 1.0 K/uL Final  . Eosinophils Relative 11/11/2017 1  % Final  . Eosinophils Absolute 11/11/2017 0.1  0.0 - 0.5 K/uL Final  . Basophils Relative 11/11/2017 0  % Final  . Basophils Absolute 11/11/2017 0.1  0.0 - 0.1 K/uL Final  . Immature Granulocytes 11/11/2017 1  % Final  . Abs  Immature Granulocytes 11/11/2017 0.10* 0.00 - 0.07 K/uL Final   Performed at [facility], [place] [address] [place], [place] [address]    (this displays the last labs from the last 3 days)  No results found for: TOTALPROTELP, ALBUMINELP, A1GS, A2GS, BETS, BETA2SER, GAMS, MSPIKE, SPEI (this displays SPEP labs)  No results found for: KPAFRELGTCHN, LAMBDASER, KAPLAMBRATIO (kappa/lambda light chains)  No results found for: HGBA, HGBA2QUANT, HGBFQUANT, HGBSQUAN (Hemoglobinopathy evaluation)   Lab Results  Component Value Date   LDH 174 04/03/2011    No results found for: IRON, TIBC, IRONPCTSAT (Iron and TIBC)  No results found for: FERRITIN  Urinalysis No results found for: COLORURINE, APPEARANCEUR, LABSPEC, PHURINE, GLUCOSEU, HGBUR, BILIRUBINUR, KETONESUR, PROTEINUR, UROBILINOGEN, NITRITE, LEUKOCYTESUR   STUDIES: IMPRESSION:Multiple hypoechoic liver lesions suspicious for metastatic disease. Borderline enlarged peripancreatic lymph node. Debris filled thick walled gallbladder with trace pericholecystic fluid, neoplasia not excludable  Electronically Signed by: Delma Post  Result Narrative  TECHNIQUE: Complete abdominal ultrasound.   Compare None.  INDICATION:Right upper quadrant pain  FINDINGS:   Aorta and GYF:VCBSWH caliber aorta and IVC Pancreas:Partially obscured. Duct is not dilated. A roughly 10 x 20 mm hypoechoic structure with a hyperechoic center is present just superior to the neck of the gland. Probably represents a lymph node with a fatty hilum. It is hypovascular on Doppler  evaluation. Liver: Borderline enlarged at 18 cm. There are multiple hypoechoic lesions within the liver involving both the left and right hepatic lobes.. There is no biliary dilatation. Portal venous  flow:Hepatopetal Gallbladder: Debris is present within the gallbladder and the wall is thickened. There is a tiny amount of pericholecystic fluid. No stones are  present..Sonographic Murphy sign is negative. CBD: 6 mm.  Right kidney: 11 cm.No sonographic abnormality Left kidney: 10.7 cm.No sonographic abnormality Spleen measures 10.1 cm.Small calcifications likely representing granulomas No ascites is present.  Other Result Information  Acute Interface, Incoming Rad Results - 11/11/2017 11:53 AM EDT TECHNIQUE: Complete abdominal ultrasound.   Compare None.  INDICATION:Right upper quadrant pain  FINDINGS:   Aorta and TTS:VXBLTJ caliber aorta and IVC Pancreas:Partially obscured. Duct is not dilated. A roughly 10 x 20 mm hypoechoic structure with a hyperechoic center is present just superior to the neck of the gland. Probably represents a lymph node with a fatty hilum. It is hypovascular on Doppler  evaluation. Liver: Borderline enlarged at 18 cm. There are multiple hypoechoic lesions within the liver involving both the left and right hepatic lobes.. There is no biliary dilatation. Portal venous flow:Hepatopetal Gallbladder: Debris is present within the gallbladder and the wall is thickened. There is a tiny amount of pericholecystic fluid. No stones are present..  Sonographic Murphy sign is negative. CBD: 6 mm.  Right kidney: 11 cm.  No sonographic abnormality Left kidney: 10.7 cm.  No sonographic abnormality Spleen measures 10.1 cm.  Small calcifications likely representing granulomas No ascites is present.   IMPRESSION:Multiple hypoechoic liver lesions suspicious for metastatic disease. Borderline enlarged peripancreatic lymph node. Debris filled thick walled gallbladder with trace pericholecystic fluid, neoplasia not excludable  Electronically Signed by: Delma Post    ELIGIBLE FOR AVAILABLE RESEARCH PROTOCOL: no  ASSESSMENT: 57 y.o. 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 Specifically we reviewed the biology of the patient's diagnosis and the specifics of her situation.  We reviewed her prior pancreatic phlegmon work-up and also her remote breast cancer work-up and treatment.  While it is possible that we are dealing with a very late recurrence of her breast cancer, I think it is more likely we are dealing with a new cancer, and given her history of smoking and chronic bronchitis lung cancer would have to be a major consideration.  Of course colon cancer or any GI malignancy also could metastasized to the liver.  She understands that we do not know that this is cancer on until it is biopsied.  I will try to arrange for the biopsy 11/15/2017.  She needs to be staged with CT scan of the chest abdomen and pelvis and I suspect she will have bony disease given her rib pain and upper right back pain symptoms.  There are no symptoms suggestive of brain involvement.  There also no symptoms or findings suggestive of cord compression.  She is understandably distraught.  She is already on fluoxetine.  I suggested adding Wellbutrin but she does not want to do that at this time.  She is controlling the pain with Aleve,  and I suggest that she combine that with Tylenol.  She does have Norco available from her recent wrist surgery but has not used it.  Incidentally she qualifies for genetics testing and I will put in for her to get that accomplished in the near future  She will see me again on  11/19/2017.  I am hopeful to have a definitive diagnosis and treatment plan for her at that time.    Mikinzie has a good understanding of the overall plan. She agrees with it. She knows the goal of treatment in her case is cure. She will call with any problems that may develop before her next visit here.   Jeanna Giuffre, Virgie Dad, MD  11/11/17 4:52 PM Medical Oncology and Hematology [facility] [address] [place], [place] [address] Tel. [phone]    Fax. [phone]  Alice Rieger, am acting as scribe for Chauncey Cruel MD.  I, Lurline Del MD, have reviewed the above documentation for accuracy and completeness, and I agree with the above.

## 2017-11-12 ENCOUNTER — Telehealth: Payer: Self-pay | Admitting: *Deleted

## 2017-11-12 ENCOUNTER — Other Ambulatory Visit (HOSPITAL_COMMUNITY): Payer: Self-pay | Admitting: Family Medicine

## 2017-11-12 ENCOUNTER — Other Ambulatory Visit: Payer: Self-pay | Admitting: Oncology

## 2017-11-12 ENCOUNTER — Telehealth: Payer: Self-pay | Admitting: Oncology

## 2017-11-12 ENCOUNTER — Other Ambulatory Visit: Payer: Self-pay

## 2017-11-12 ENCOUNTER — Telehealth: Payer: Self-pay

## 2017-11-12 DIAGNOSIS — C50811 Malignant neoplasm of overlapping sites of right female breast: Secondary | ICD-10-CM

## 2017-11-12 DIAGNOSIS — Z170 Estrogen receptor positive status [ER+]: Principal | ICD-10-CM

## 2017-11-12 LAB — CEA (IN HOUSE-CHCC): CEA (CHCC-IN HOUSE): 10.64 ng/mL — AB (ref 0.00–5.00)

## 2017-11-12 LAB — CANCER ANTIGEN 19-9: CAN 19-9: 15854 U/mL — AB (ref 0–35)

## 2017-11-12 LAB — CANCER ANTIGEN 27.29: CA 27.29: 149.7 U/mL — ABNORMAL HIGH (ref 0.0–38.6)

## 2017-11-12 MED ORDER — LORAZEPAM 0.5 MG PO TABS: 0.5000 mg | ORAL_TABLET | Freq: Two times a day (BID) | ORAL | 0 refills | Status: DC

## 2017-11-12 NOTE — Telephone Encounter (Signed)
lvm with Jacqualin Combes, Managed [facility] at [facility] - f/u to see if PA has been obtained so that we may get bone scan and CTs scheduled sooner than 10/23.

## 2017-11-12 NOTE — Telephone Encounter (Signed)
Appts scheduled and patient has been notified of date/time per 10/17 sch msg

## 2017-11-12 NOTE — Telephone Encounter (Signed)
This RN spoke with pt's husband to inform him biopsy can be performed om 10/21 at 25 am at [facility] radiology but the patient would need to bring a disc of the US performed at [facility] showing area of concern.  This RN also stated need to be NPO post midnight and avoidance of aspirin ( pt is not on blood thinners ).  Mr Beverley stated concern for obtaining disc stating " I will be there at 8 am to pick it "  This RN verified location of which [facility] facility pt had [place] - which was done at [facility] on [address].  This RN called to [facility] on [address] and per [place] - they open at 8 am and " disc will be burned when he comes in "  This RN returned call to Mr Yoshimura who verbalized understanding.

## 2017-11-12 NOTE — Telephone Encounter (Signed)
No 10/17 los orders. [facility] will call patient with appts.

## 2017-11-15 ENCOUNTER — Encounter (HOSPITAL_COMMUNITY): Payer: Self-pay

## 2017-11-15 ENCOUNTER — Ambulatory Visit (HOSPITAL_COMMUNITY)
Admission: RE | Admit: 2017-11-15 | Discharge: 2017-11-15 | Disposition: A | Discharge status: Home / Self Care | Payer: BLUE CROSS/BLUE SHIELD | Source: Ambulatory Visit | Attending: Oncology | Admitting: Oncology

## 2017-11-15 ENCOUNTER — Inpatient Hospital Stay
Admission: RE | Admit: 2017-11-15 | Discharge: 2017-11-15 | Disposition: A | Discharge status: Home / Self Care | Payer: Self-pay | Source: Ambulatory Visit | Attending: Interventional Radiology | Admitting: Interventional Radiology

## 2017-11-15 ENCOUNTER — Inpatient Hospital Stay (HOSPITAL_COMMUNITY)
Admission: EM | Admit: 2017-11-15 | Discharge: 2017-11-19 | DRG: 445 | Disposition: A | Discharge status: Home / Self Care | Payer: BLUE CROSS/BLUE SHIELD | Attending: Internal Medicine | Admitting: Internal Medicine

## 2017-11-15 ENCOUNTER — Other Ambulatory Visit: Payer: Self-pay | Admitting: Student

## 2017-11-15 ENCOUNTER — Emergency Department (HOSPITAL_COMMUNITY): Payer: BLUE CROSS/BLUE SHIELD

## 2017-11-15 ENCOUNTER — Other Ambulatory Visit: Payer: Self-pay

## 2017-11-15 ENCOUNTER — Other Ambulatory Visit (HOSPITAL_COMMUNITY): Payer: Self-pay | Admitting: Interventional Radiology

## 2017-11-15 DIAGNOSIS — K589 Irritable bowel syndrome without diarrhea: Secondary | ICD-10-CM | POA: Insufficient documentation

## 2017-11-15 DIAGNOSIS — Z170 Estrogen receptor positive status [ER+]: Secondary | ICD-10-CM

## 2017-11-15 DIAGNOSIS — J42 Unspecified chronic bronchitis: Secondary | ICD-10-CM | POA: Diagnosis present

## 2017-11-15 DIAGNOSIS — C801 Malignant (primary) neoplasm, unspecified: Secondary | ICD-10-CM | POA: Diagnosis not present

## 2017-11-15 DIAGNOSIS — Z9010 Acquired absence of unspecified breast and nipple: Secondary | ICD-10-CM | POA: Diagnosis not present

## 2017-11-15 DIAGNOSIS — I10 Essential (primary) hypertension: Secondary | ICD-10-CM | POA: Diagnosis present

## 2017-11-15 DIAGNOSIS — M8448XA Pathological fracture, other site, initial encounter for fracture: Secondary | ICD-10-CM | POA: Diagnosis present

## 2017-11-15 DIAGNOSIS — F419 Anxiety disorder, unspecified: Secondary | ICD-10-CM | POA: Diagnosis present

## 2017-11-15 DIAGNOSIS — R1011 Right upper quadrant pain: Secondary | ICD-10-CM | POA: Diagnosis not present

## 2017-11-15 DIAGNOSIS — R918 Other nonspecific abnormal finding of lung field: Secondary | ICD-10-CM | POA: Diagnosis present

## 2017-11-15 DIAGNOSIS — Z9011 Acquired absence of right breast and nipple: Secondary | ICD-10-CM

## 2017-11-15 DIAGNOSIS — C787 Secondary malignant neoplasm of liver and intrahepatic bile duct: Secondary | ICD-10-CM

## 2017-11-15 DIAGNOSIS — C7951 Secondary malignant neoplasm of bone: Secondary | ICD-10-CM | POA: Diagnosis present

## 2017-11-15 DIAGNOSIS — C50811 Malignant neoplasm of overlapping sites of right female breast: Secondary | ICD-10-CM

## 2017-11-15 DIAGNOSIS — F329 Major depressive disorder, single episode, unspecified: Secondary | ICD-10-CM | POA: Diagnosis present

## 2017-11-15 DIAGNOSIS — K7290 Hepatic failure, unspecified without coma: Secondary | ICD-10-CM | POA: Diagnosis present

## 2017-11-15 DIAGNOSIS — M4804 Spinal stenosis, thoracic region: Secondary | ICD-10-CM | POA: Diagnosis present

## 2017-11-15 DIAGNOSIS — K769 Liver disease, unspecified: Secondary | ICD-10-CM | POA: Diagnosis not present

## 2017-11-15 DIAGNOSIS — K7200 Acute and subacute hepatic failure without coma: Secondary | ICD-10-CM | POA: Diagnosis not present

## 2017-11-15 DIAGNOSIS — Z9221 Personal history of antineoplastic chemotherapy: Secondary | ICD-10-CM

## 2017-11-15 DIAGNOSIS — Z720 Tobacco use: Secondary | ICD-10-CM | POA: Diagnosis present

## 2017-11-15 DIAGNOSIS — Z716 Tobacco abuse counseling: Secondary | ICD-10-CM

## 2017-11-15 DIAGNOSIS — R933 Abnormal findings on diagnostic imaging of other parts of digestive tract: Secondary | ICD-10-CM | POA: Diagnosis not present

## 2017-11-15 DIAGNOSIS — K831 Obstruction of bile duct: Secondary | ICD-10-CM | POA: Insufficient documentation

## 2017-11-15 DIAGNOSIS — D630 Anemia in neoplastic disease: Secondary | ICD-10-CM | POA: Diagnosis present

## 2017-11-15 DIAGNOSIS — Z809 Family history of malignant neoplasm, unspecified: Secondary | ICD-10-CM

## 2017-11-15 DIAGNOSIS — R17 Unspecified jaundice: Secondary | ICD-10-CM

## 2017-11-15 DIAGNOSIS — K5900 Constipation, unspecified: Secondary | ICD-10-CM | POA: Diagnosis present

## 2017-11-15 DIAGNOSIS — C779 Secondary and unspecified malignant neoplasm of lymph node, unspecified: Secondary | ICD-10-CM | POA: Diagnosis not present

## 2017-11-15 DIAGNOSIS — K529 Noninfective gastroenteritis and colitis, unspecified: Secondary | ICD-10-CM | POA: Diagnosis present

## 2017-11-15 DIAGNOSIS — Z79899 Other long term (current) drug therapy: Secondary | ICD-10-CM

## 2017-11-15 DIAGNOSIS — R1310 Dysphagia, unspecified: Secondary | ICD-10-CM | POA: Diagnosis present

## 2017-11-15 DIAGNOSIS — Z7401 Bed confinement status: Secondary | ICD-10-CM

## 2017-11-15 DIAGNOSIS — C50919 Malignant neoplasm of unspecified site of unspecified female breast: Secondary | ICD-10-CM | POA: Diagnosis not present

## 2017-11-15 DIAGNOSIS — Z853 Personal history of malignant neoplasm of breast: Secondary | ICD-10-CM

## 2017-11-15 DIAGNOSIS — Z90710 Acquired absence of both cervix and uterus: Secondary | ICD-10-CM

## 2017-11-15 DIAGNOSIS — C221 Intrahepatic bile duct carcinoma: Secondary | ICD-10-CM | POA: Diagnosis not present

## 2017-11-15 DIAGNOSIS — Z791 Long term (current) use of non-steroidal anti-inflammatories (NSAID): Secondary | ICD-10-CM

## 2017-11-15 DIAGNOSIS — M8458XA Pathological fracture in neoplastic disease, other specified site, initial encounter for fracture: Secondary | ICD-10-CM | POA: Diagnosis not present

## 2017-11-15 DIAGNOSIS — Z23 Encounter for immunization: Secondary | ICD-10-CM

## 2017-11-15 DIAGNOSIS — Z888 Allergy status to other drugs, medicaments and biological substances status: Secondary | ICD-10-CM

## 2017-11-15 LAB — COMPREHENSIVE METABOLIC PANEL
ALT: 167 U/L — AB (ref 0–44)
AST: 152 U/L — ABNORMAL HIGH (ref 15–41)
Albumin: 3.1 g/dL — ABNORMAL LOW (ref 3.5–5.0)
Alkaline Phosphatase: 1233 U/L — ABNORMAL HIGH (ref 38–126)
Anion gap: 7 (ref 5–15)
BILIRUBIN TOTAL: 11.1 mg/dL — AB (ref 0.3–1.2)
BUN: 15 mg/dL (ref 6–20)
CHLORIDE: 98 mmol/L (ref 98–111)
CO2: 25 mmol/L (ref 22–32)
CREATININE: 0.61 mg/dL (ref 0.44–1.00)
Calcium: 8.7 mg/dL — ABNORMAL LOW (ref 8.9–10.3)
Glucose, Bld: 119 mg/dL — ABNORMAL HIGH (ref 70–99)
POTASSIUM: 3.9 mmol/L (ref 3.5–5.1)
Sodium: 130 mmol/L — ABNORMAL LOW (ref 135–145)
TOTAL PROTEIN: 6.6 g/dL (ref 6.5–8.1)

## 2017-11-15 LAB — URINALYSIS, ROUTINE W REFLEX MICROSCOPIC
Bacteria, UA: NONE SEEN
Glucose, UA: NEGATIVE mg/dL
Hgb urine dipstick: NEGATIVE
KETONES UR: 5 mg/dL — AB
Leukocytes, UA: NEGATIVE
NITRITE: NEGATIVE
Protein, ur: 30 mg/dL — AB
Specific Gravity, Urine: 1.018 (ref 1.005–1.030)
pH: 6 (ref 5.0–8.0)

## 2017-11-15 LAB — CBC WITH DIFFERENTIAL/PLATELET
ABS IMMATURE GRANULOCYTES: 0.1 10*3/uL — AB (ref 0.00–0.07)
BASOS ABS: 0.1 10*3/uL (ref 0.0–0.1)
BASOS PCT: 1 %
Eosinophils Absolute: 0.1 10*3/uL (ref 0.0–0.5)
Eosinophils Relative: 1 %
HCT: 34.6 % — ABNORMAL LOW (ref 36.0–46.0)
HEMOGLOBIN: 11.7 g/dL — AB (ref 12.0–15.0)
IMMATURE GRANULOCYTES: 1 %
Lymphocytes Relative: 5 %
Lymphs Abs: 0.7 10*3/uL (ref 0.7–4.0)
MCH: 32 pg (ref 26.0–34.0)
MCHC: 33.8 g/dL (ref 30.0–36.0)
MCV: 94.5 fL (ref 80.0–100.0)
MONO ABS: 1.6 10*3/uL — AB (ref 0.1–1.0)
Monocytes Relative: 12 %
NEUTROS ABS: 10.8 10*3/uL — AB (ref 1.7–7.7)
NEUTROS PCT: 80 %
PLATELETS: 258 10*3/uL (ref 150–400)
RBC: 3.66 MIL/uL — AB (ref 3.87–5.11)
RDW: 12.5 % (ref 11.5–15.5)
WBC: 13.4 10*3/uL — AB (ref 4.0–10.5)
nRBC: 0 % (ref 0.0–0.2)

## 2017-11-15 LAB — PROTIME-INR
INR: 0.91
INR: 0.95
PROTHROMBIN TIME: 12.1 s (ref 11.4–15.2)
PROTHROMBIN TIME: 12.5 s (ref 11.4–15.2)

## 2017-11-15 LAB — I-STAT CG4 LACTIC ACID, ED: LACTIC ACID, VENOUS: 1.05 mmol/L (ref 0.5–1.9)

## 2017-11-15 LAB — CBC
HCT: 38 % (ref 36.0–46.0)
Hemoglobin: 12.8 g/dL (ref 12.0–15.0)
MCH: 32.1 pg (ref 26.0–34.0)
MCHC: 33.7 g/dL (ref 30.0–36.0)
MCV: 95.2 fL (ref 80.0–100.0)
Platelets: 263 10*3/uL (ref 150–400)
RBC: 3.99 MIL/uL (ref 3.87–5.11)
RDW: 12.5 % (ref 11.5–15.5)
WBC: 15.2 10*3/uL — ABNORMAL HIGH (ref 4.0–10.5)
nRBC: 0 % (ref 0.0–0.2)

## 2017-11-15 LAB — LIPASE, BLOOD: LIPASE: 36 U/L (ref 11–51)

## 2017-11-15 LAB — AMMONIA: Ammonia: 65 umol/L — ABNORMAL HIGH (ref 9–35)

## 2017-11-15 LAB — APTT: APTT: 27 s (ref 24–36)

## 2017-11-15 MED ORDER — LIDOCAINE HCL 1 % IJ SOLN
INTRAMUSCULAR | Status: AC
  Filled 2017-11-15: qty 20

## 2017-11-15 MED ORDER — HYDROMORPHONE HCL 1 MG/ML IJ SOLN
0.5000 mg | INTRAMUSCULAR | Status: DC | PRN
  Administered 2017-11-15 (×2): 0.5 mg via INTRAVENOUS
  Filled 2017-11-15 (×2): qty 1

## 2017-11-15 MED ORDER — LORAZEPAM 2 MG/ML IJ SOLN
1.0000 mg | Freq: Four times a day (QID) | INTRAMUSCULAR | Status: DC | PRN
  Administered 2017-11-15: 1 mg via INTRAVENOUS
  Filled 2017-11-15: qty 1

## 2017-11-15 MED ORDER — FAMOTIDINE IN NACL 20-0.9 MG/50ML-% IV SOLN
20.0000 mg | Freq: Once | INTRAVENOUS | Status: AC
  Administered 2017-11-15: 20 mg via INTRAVENOUS
  Filled 2017-11-15: qty 50

## 2017-11-15 MED ORDER — FENTANYL CITRATE (PF) 100 MCG/2ML IJ SOLN
INTRAMUSCULAR | Status: AC | PRN
  Administered 2017-11-15 (×2): 50 ug via INTRAVENOUS

## 2017-11-15 MED ORDER — HYDROMORPHONE HCL 1 MG/ML IJ SOLN
0.5000 mg | Freq: Once | INTRAMUSCULAR | Status: AC
  Administered 2017-11-15: 0.5 mg via INTRAVENOUS
  Filled 2017-11-15: qty 1

## 2017-11-15 MED ORDER — IOPAMIDOL (ISOVUE-300) INJECTION 61%
100.0000 mL | Freq: Once | INTRAVENOUS | Status: AC | PRN
  Administered 2017-11-15: 100 mL via INTRAVENOUS

## 2017-11-15 MED ORDER — FENTANYL CITRATE (PF) 100 MCG/2ML IJ SOLN
INTRAMUSCULAR | Status: AC
  Filled 2017-11-15: qty 2

## 2017-11-15 MED ORDER — IOPAMIDOL (ISOVUE-300) INJECTION 61%
INTRAVENOUS | Status: AC
  Filled 2017-11-15: qty 100

## 2017-11-15 MED ORDER — MIDAZOLAM HCL 2 MG/2ML IJ SOLN
INTRAMUSCULAR | Status: AC | PRN
  Administered 2017-11-15 (×2): 1 mg via INTRAVENOUS

## 2017-11-15 MED ORDER — SODIUM CHLORIDE 0.9 % IJ SOLN
INTRAMUSCULAR | Status: AC
  Administered 2017-11-15: 1000 mL
  Filled 2017-11-15: qty 50

## 2017-11-15 MED ORDER — GELATIN ABSORBABLE 12-7 MM EX MISC
CUTANEOUS | Status: AC
  Filled 2017-11-15: qty 1

## 2017-11-15 MED ORDER — SODIUM CHLORIDE 0.9 % IV SOLN
Freq: Once | INTRAVENOUS | Status: AC
  Administered 2017-11-15: 17:00:00 via INTRAVENOUS

## 2017-11-15 MED ORDER — SODIUM CHLORIDE 0.9 % IV SOLN
INTRAVENOUS | Status: DC
  Administered 2017-11-15: 11:00:00 via INTRAVENOUS

## 2017-11-15 MED ORDER — MIDAZOLAM HCL 2 MG/2ML IJ SOLN
INTRAMUSCULAR | Status: AC
  Filled 2017-11-15: qty 2

## 2017-11-15 NOTE — H&P (Signed)
History and Physical   Caitlin Pollard KXF:[id number] DOB: Aug 20, 1960 DOA: 11/15/2017  Referring MD/NP/PA: Dr. Johnney Killian  PCP: Chesley Noon, MD   Outpatient Specialists: Dr. Jana Hakim  Patient coming from: Home  Chief Complaint: Jaundice   HPI: Caitlin Pollard is a 57 y.o. female with medical history significant of breast cancer diagnosed in 1999 and treated.  Patient took tamoxifen for 10 years thereafter and stopped after she was cleared.  She also try letrozole for 1 year after that.  Patient has had follow-up including previous pancreatic lesion in 2006 with biopsy done at [facility] which was found to be benign.  She has hypertension as well as inflammatory bowel disease.  Patient has been doing fine until about a month ago when she started getting some cough thoracic and back pain.  She went to see her primary care physician and at the time was thought to have bronchitis.  She later has right upper quadrant abdominal pain that was evaluated with a scan coming back showing multiple hepatic lesions suspected to be metastatic disease.  She was seen by oncology and had biopsy done today for the liver.  Patient came to the ER afterwards because of worsening symptoms including jaundice and weakness.  Patient is in tears at the moment with family at bedside.  She is having some dull ache in the area and very anxious.  She has severe jaundice with elevated LFTs including total bilirubin of 11.  Gastroenterology has been consulted and patient is being placed in the hospital for further work-up..  ED Course: Temperature is 98.7 blood pressure 160/110 pulse 99 respiratory of 20 oxygen sats 95% room air.  White count is 13.4 hemoglobin 11.7 and platelet 258.  Sodium 130 potassium 3.9 chloride 98 with CO2 25 and creatinine 0.61 glucose 119.  Ammonia level is 65 alkaline phosphatase 1233 albumin 3.1 AST 152 ALT 167 total protein, total bilirubin is 11, it was only 3 4 days ago.  66 lactic acid 1.05.   Patient had recent CA-19-9 of 15,854 CA 27.29 is 149.7 and CEA is 10.64.  PT/INR is within normal.  She is being admitted for further work-up of obstructive jaundice.  Review of Systems: As per HPI otherwise 10 point review of systems negative.    Past Medical History:  Diagnosis Date  . Breast cancer ([place])   . Breast cancer, stage 2 ([place]) 04/05/2011  . HTN (hypertension), benign 04/05/2011  . Hyperglycemia without ketosis 04/05/2011  . IBD (inflammatory bowel disease) 04/05/2011    Past Surgical History:  Procedure Laterality Date  . ABDOMINAL HYSTERECTOMY    . AUGMENTATION MAMMAPLASTY Bilateral   . KNEE SURGERY    . MASTECTOMY Right   . WRIST SURGERY       reports that she has never smoked. She has never used smokeless tobacco. She reports that she drinks alcohol. She reports that she does not use drugs.  Allergies  Allergen Reactions  . Cholestatin Other (See Comments)    Runny eyes--pt. Denies this allergy    Family History  Problem Relation Age of Onset  . Cancer Father      Prior to Admission medications   Medication Sig Start Date End Date Taking? Authorizing Provider  Calcium Carb-Cholecalciferol (CALCIUM 1000 + D PO) Take 1 tablet by mouth daily.   Yes [provider]  cholecalciferol (VITAMIN D) 1000 UNITS tablet Take 2,000 Units by mouth daily.   Yes [provider]  cyclobenzaprine (FLEXERIL) 10 MG tablet Take  by mouth. 02/25/17  Yes [provider]  FLUoxetine (PROZAC) 10 MG capsule Take 10 mg by mouth daily.  07/14/17 07/15/18 Yes [provider]  ibuprofen (ADVIL,MOTRIN) 200 MG tablet Take 200 mg by mouth every 6 (six) hours as needed.   Yes [provider]  LORazepam (ATIVAN) 0.5 MG tablet Take 1 tablet (0.5 mg total) by mouth 2 (two) times daily. 11/12/17  Yes Magrinat, Virgie Dad, MD  losartan (COZAAR) 100 MG tablet Take 100 mg by mouth daily.  07/14/17  Yes [provider]    Physical Exam: Vitals:    11/15/17 1708 11/15/17 1753 11/15/17 1837 11/15/17 2230  BP: (!) 141/94 (!) 143/97 (!) 146/100 (!) 160/110  Pulse: 86 84 87 90  Resp: 18 18 18 19   Temp: 98.1 F (36.7 C) 98.7 F (37.1 C) 98.4 F (36.9 C)   TempSrc: Oral Oral Oral   SpO2: 100% 97% 99% 97%  Weight:      Height:          Constitutional: NAD, calm, comfortable Vitals:   11/15/17 1708 11/15/17 1753 11/15/17 1837 11/15/17 2230  BP: (!) 141/94 (!) 143/97 (!) 146/100 (!) 160/110  Pulse: 86 84 87 90  Resp: 18 18 18 19   Temp: 98.1 F (36.7 C) 98.7 F (37.1 C) 98.4 F (36.9 C)   TempSrc: Oral Oral Oral   SpO2: 100% 97% 99% 97%  Weight:      Height:       Eyes: Icteric jaundice ENMT: Mucous membranes are moist. Posterior pharynx clear of any exudate or lesions.Normal dentition.  Neck: normal, supple, no masses, no thyromegaly Respiratory: clear to auscultation bilaterally, no wheezing, no crackles. Normal respiratory effort. No accessory muscle use.  Cardiovascular: Regular rate and rhythm, no murmurs / rubs / gallops. No extremity edema. 2+ pedal pulses. No carotid bruits.  Abdomen: Mild tenderness, no masses palpated. No hepatosplenomegaly. Bowel sounds positive.  Musculoskeletal: no clubbing / cyanosis. No joint deformity upper and lower extremities. Good ROM, no contractures. Normal muscle tone.  Skin: no rashes, lesions, ulcers. No induration Neurologic: CN 2-12 grossly intact. Sensation intact, DTR normal. Strength 5/5 in all 4.  Psychiatric: Depressed mood.     Labs on Admission: I have personally reviewed following labs and imaging studies  CBC: Recent Labs  Lab 11/11/17 1543 11/15/17 1113 11/15/17 1706  WBC 17.6* 15.2* 13.4*  NEUTROABS 14.8*  --  10.8*  HGB 12.8 12.8 11.7*  HCT 37.8 38.0 34.6*  MCV 96.4 95.2 94.5  PLT 305 263 696   Basic Metabolic Panel: Recent Labs  Lab 11/11/17 1543 11/15/17 1706  NA 133* 130*  K 4.5 3.9  CL 92* 98  CO2 28 25  GLUCOSE 109* 119*  BUN 12 15    CREATININE 0.72 0.61  CALCIUM 10.1 8.7*   GFR: Estimated Creatinine Clearance: 74.5 mL/min (by C-G formula based on SCr of 0.61 mg/dL). Liver Function Tests: Recent Labs  Lab 11/11/17 1543 11/15/17 1706  AST 315* 152*  ALT 260* 167*  ALKPHOS 1,553* 1,233*  BILITOT 3.0* 11.1*  PROT 7.3 6.6  ALBUMIN 3.3* 3.1*   Recent Labs  Lab 11/15/17 1706  LIPASE 36   Recent Labs  Lab 11/15/17 1706  AMMONIA 65*   Coagulation Profile: Recent Labs  Lab 11/15/17 1113 11/15/17 1706  INR 0.91 0.95   Cardiac Enzymes: No results for input(s): CKTOTAL, CKMB, CKMBINDEX, TROPONINI in the last 168 hours. BNP (last 3 results) No results for input(s): PROBNP in the  last 8760 hours. HbA1C: No results for input(s): HGBA1C in the last 72 hours. CBG: No results for input(s): GLUCAP in the last 168 hours. Lipid Profile: No results for input(s): CHOL, HDL, LDLCALC, TRIG, CHOLHDL, LDLDIRECT in the last 72 hours. Thyroid Function Tests: No results for input(s): TSH, T4TOTAL, FREET4, T3FREE, THYROIDAB in the last 72 hours. Anemia Panel: No results for input(s): VITAMINB12, FOLATE, FERRITIN, TIBC, IRON, RETICCTPCT in the last 72 hours. Urine analysis:    Component Value Date/Time   COLORURINE AMBER (A) 11/15/2017 1724   APPEARANCEUR HAZY (A) 11/15/2017 1724   LABSPEC 1.018 11/15/2017 1724   PHURINE 6.0 11/15/2017 1724   GLUCOSEU NEGATIVE 11/15/2017 1724   HGBUR NEGATIVE 11/15/2017 1724   BILIRUBINUR MODERATE (A) 11/15/2017 1724   KETONESUR 5 (A) 11/15/2017 1724   PROTEINUR 30 (A) 11/15/2017 1724   NITRITE NEGATIVE 11/15/2017 1724   LEUKOCYTESUR NEGATIVE 11/15/2017 1724   Sepsis Labs: @LABRCNTIP (procalcitonin:4,lacticidven:4) )No results found for this or any previous visit (from the past 240 hour(s)).   Radiological Exams on Admission: Ct Chest W Contrast  Result Date: 11/15/2017 CLINICAL DATA:  Liver lesions post ultrasound-guided biopsy today. Jaundice. Diffuse pain. Listed  history of breast cancer in electronic medical record. EXAM: CT CHEST, ABDOMEN, AND PELVIS WITH CONTRAST TECHNIQUE: Multidetector CT imaging of the chest, abdomen and pelvis was performed following the standard protocol during bolus administration of intravenous contrast. CONTRAST:  168mL ISOVUE-300 IOPAMIDOL (ISOVUE-300) INJECTION 61% COMPARISON:  Ultrasound 11/11/2017 FINDINGS: CT CHEST FINDINGS Cardiovascular: Aortic atherosclerosis without aneurysm. The heart is normal in size. There are coronary artery calcifications. No pericardial fluid. Mediastinum/Nodes: No enlarged hilar nodes. 6 mm right epicardial node, no other mediastinal adenopathy. Small lymph nodes in the left axilla largest measuring 7 mm. Surgical clips in the right axilla without right axillary adenopathy. Heterogeneous left lobe of the thyroid gland with multiple small nodules, largest measuring 10 mm. The esophagus is decompressed. Lungs/Pleura: Calcified granuloma in the right upper lobe series 6, image 36. Small noncalcified nodules including punctate right upper lobe nodule image 35, 4 mm left upper lobe nodule image 65, punctate subpleural right middle lobe nodule image 84, and 3 mm left lower lobe nodule image 94. No confluent airspace disease. No pleural fluid. Musculoskeletal: Primarily sclerotic lesion of T10 with diffuse sclerosis of the the vertebral body, and moth-eaten appearance of both lamina, sclerosis of the transverse and spinous processes. Sclerotic lesions within posterior T9 vertebral body extending into the left lamina. Mixed lytic and sclerotic lesion posterior T8 vertebral body with likely extension to the left lamina. Sclerotic lesion posterior T11 vertebral body. Trabecular coarsening of T4 vertebral body may simply represent a hemangioma but is nonspecific. Bilateral breast implants. CT ABDOMEN PELVIS FINDINGS Hepatobiliary: Innumerable defined low-density lesions throughout the liver consistent with metastatic  disease. Conglomerate lesion in the periphery measures approximately 6.8 x 10 cm with slight capsular retraction. Second dominant lesion in the right lobe measures 8.3 x 5.7 cm. There is intrahepatic biliary ductal dilatation. Dilatation of the common bile duct to 16 mm with abrupt transition, image 52 series 4 with associated soft tissue density. Gallbladder partially distended with diffuse gallbladder wall thickening. Small amount perihepatic ascites, measuring simple fluid density. No evidence of hepatic biopsy complication or hemorrhage. Pancreas: Soft tissue fullness in the uncinate process favored to represent adjacent adenopathy rather than focal pancreatic lesion, however difficult to delineate on CT. No ductal dilatation or inflammation. Spleen: Calcified granuloma. No cystic or solid lesion. Spleen is normal in size. Adrenals/Urinary  Tract: Normal adrenal glands without adrenal nodule. No hydronephrosis or perinephric edema. Homogeneous renal enhancement with symmetric excretion on delayed phase imaging. Urinary bladder is physiologically distended without wall thickening. Stomach/Bowel: Lack of enteric contrast limits bowel assessment. No evidence bowel wall thickening, inflammatory change or obstruction. No obvious focal bowel lesion. Moderate stool in the proximal colon. Normal appendix. Vascular/Lymphatic: Aortic atherosclerosis without aneurysm. Bi-iliac atherosclerosis. Portal vein is patent at the porta hepatis, attenuated at the porta splenic confluence, possible occluded with surrounding collaterals. Splenic vein is patent. Heterogeneous appearance of the superior mesenteric vein may be due to contrast mixing, no discrete thrombus. Suspected porta hepatis and portacaval adenopathy, with ill-defined soft tissue density the portal caval region, difficult to delineate from adjacent structures. Small retroperitoneal nodes. No pelvic adenopathy. Reproductive: Status post hysterectomy. No adnexal  masses. Other: Small volume of perihepatic and pelvic ascites.  No free air. Musculoskeletal: Sclerotic lesion within L1 vertebral body extends into the left lamina. Scattered lumbar degenerative change. IMPRESSION: 1. Hepatic metastatic disease with innumerable liver lesions, biopsied earlier today. No evidence of post biopsy hemorrhage or complication. 2. Intrahepatic and proximal extrahepatic biliary ductal dilatation, abrupt transition proximally in the common duct with associated soft tissue density, which may represent adenopathy but is not well delineated. Ill-defined soft tissue density in the porta hepatis and portacaval region is likely adenopathy. This is contiguous with the uncinate process of the pancreas, focal pancreatic lesion difficult to exclude. No pancreatic ductal dilatation. 3. Attenuated portal vein at the portal splenic confluence, possibly occluded with suspected portal collaterals. Portal vein is otherwise patent. 4. Osseous metastatic disease with primarily sclerotic lesions, primarily involving the spine. Lesions involve T8, T9, T10, T11, and L1. Indeterminate lesion at T4 versus hemangioma. T10 lesion involves the entire vertebral body, with heterogeneous appearance of the lamina and possible extraosseous component and mass-effect on the spinal canal. Recommend thoracic spine MRI to evaluate for cord compression. 5. Nonspecific small pulmonary nodules, largest measuring 4 mm in the left upper lobe. 6. Small amount of perihepatic and pelvic ascites. 7. Aortic Atherosclerosis (ICD10-I70.0). Coronary artery calcifications. Electronically Signed   By: Keith Rake M.D.   On: 11/15/2017 22:09   Ct Abdomen Pelvis W Contrast  Result Date: 11/15/2017 CLINICAL DATA:  Liver lesions post ultrasound-guided biopsy today. Jaundice. Diffuse pain. Listed history of breast cancer in electronic medical record. EXAM: CT CHEST, ABDOMEN, AND PELVIS WITH CONTRAST TECHNIQUE: Multidetector CT imaging  of the chest, abdomen and pelvis was performed following the standard protocol during bolus administration of intravenous contrast. CONTRAST:  155mL ISOVUE-300 IOPAMIDOL (ISOVUE-300) INJECTION 61% COMPARISON:  Ultrasound 11/11/2017 FINDINGS: CT CHEST FINDINGS Cardiovascular: Aortic atherosclerosis without aneurysm. The heart is normal in size. There are coronary artery calcifications. No pericardial fluid. Mediastinum/Nodes: No enlarged hilar nodes. 6 mm right epicardial node, no other mediastinal adenopathy. Small lymph nodes in the left axilla largest measuring 7 mm. Surgical clips in the right axilla without right axillary adenopathy. Heterogeneous left lobe of the thyroid gland with multiple small nodules, largest measuring 10 mm. The esophagus is decompressed. Lungs/Pleura: Calcified granuloma in the right upper lobe series 6, image 36. Small noncalcified nodules including punctate right upper lobe nodule image 35, 4 mm left upper lobe nodule image 65, punctate subpleural right middle lobe nodule image 84, and 3 mm left lower lobe nodule image 94. No confluent airspace disease. No pleural fluid. Musculoskeletal: Primarily sclerotic lesion of T10 with diffuse sclerosis of the the vertebral body, and moth-eaten appearance of both lamina,  sclerosis of the transverse and spinous processes. Sclerotic lesions within posterior T9 vertebral body extending into the left lamina. Mixed lytic and sclerotic lesion posterior T8 vertebral body with likely extension to the left lamina. Sclerotic lesion posterior T11 vertebral body. Trabecular coarsening of T4 vertebral body may simply represent a hemangioma but is nonspecific. Bilateral breast implants. CT ABDOMEN PELVIS FINDINGS Hepatobiliary: Innumerable defined low-density lesions throughout the liver consistent with metastatic disease. Conglomerate lesion in the periphery measures approximately 6.8 x 10 cm with slight capsular retraction. Second dominant lesion in the  right lobe measures 8.3 x 5.7 cm. There is intrahepatic biliary ductal dilatation. Dilatation of the common bile duct to 16 mm with abrupt transition, image 52 series 4 with associated soft tissue density. Gallbladder partially distended with diffuse gallbladder wall thickening. Small amount perihepatic ascites, measuring simple fluid density. No evidence of hepatic biopsy complication or hemorrhage. Pancreas: Soft tissue fullness in the uncinate process favored to represent adjacent adenopathy rather than focal pancreatic lesion, however difficult to delineate on CT. No ductal dilatation or inflammation. Spleen: Calcified granuloma. No cystic or solid lesion. Spleen is normal in size. Adrenals/Urinary Tract: Normal adrenal glands without adrenal nodule. No hydronephrosis or perinephric edema. Homogeneous renal enhancement with symmetric excretion on delayed phase imaging. Urinary bladder is physiologically distended without wall thickening. Stomach/Bowel: Lack of enteric contrast limits bowel assessment. No evidence bowel wall thickening, inflammatory change or obstruction. No obvious focal bowel lesion. Moderate stool in the proximal colon. Normal appendix. Vascular/Lymphatic: Aortic atherosclerosis without aneurysm. Bi-iliac atherosclerosis. Portal vein is patent at the porta hepatis, attenuated at the porta splenic confluence, possible occluded with surrounding collaterals. Splenic vein is patent. Heterogeneous appearance of the superior mesenteric vein may be due to contrast mixing, no discrete thrombus. Suspected porta hepatis and portacaval adenopathy, with ill-defined soft tissue density the portal caval region, difficult to delineate from adjacent structures. Small retroperitoneal nodes. No pelvic adenopathy. Reproductive: Status post hysterectomy. No adnexal masses. Other: Small volume of perihepatic and pelvic ascites.  No free air. Musculoskeletal: Sclerotic lesion within L1 vertebral body extends into  the left lamina. Scattered lumbar degenerative change. IMPRESSION: 1. Hepatic metastatic disease with innumerable liver lesions, biopsied earlier today. No evidence of post biopsy hemorrhage or complication. 2. Intrahepatic and proximal extrahepatic biliary ductal dilatation, abrupt transition proximally in the common duct with associated soft tissue density, which may represent adenopathy but is not well delineated. Ill-defined soft tissue density in the porta hepatis and portacaval region is likely adenopathy. This is contiguous with the uncinate process of the pancreas, focal pancreatic lesion difficult to exclude. No pancreatic ductal dilatation. 3. Attenuated portal vein at the portal splenic confluence, possibly occluded with suspected portal collaterals. Portal vein is otherwise patent. 4. Osseous metastatic disease with primarily sclerotic lesions, primarily involving the spine. Lesions involve T8, T9, T10, T11, and L1. Indeterminate lesion at T4 versus hemangioma. T10 lesion involves the entire vertebral body, with heterogeneous appearance of the lamina and possible extraosseous component and mass-effect on the spinal canal. Recommend thoracic spine MRI to evaluate for cord compression. 5. Nonspecific small pulmonary nodules, largest measuring 4 mm in the left upper lobe. 6. Small amount of perihepatic and pelvic ascites. 7. Aortic Atherosclerosis (ICD10-I70.0). Coronary artery calcifications. Electronically Signed   By: Keith Rake M.D.   On: 11/15/2017 22:09   US Biopsy (liver)  Result Date: 11/15/2017 CLINICAL DATA:  Multiple liver lesions by prior ultrasound. The patient presents for biopsy. EXAM: ULTRASOUND GUIDED CORE BIOPSY OF LIVER COMPARISON:  Prior abdominal ultrasound at [facility] on 11/11/2017 MEDICATIONS: 2.0 mg IV Versed; 100 mcg IV Fentanyl Total Moderate Sedation Time: 20 minutes. The patient's level of consciousness and physiologic status were continuously monitored  during the procedure by Radiology nursing. PROCEDURE: The procedure, risks, benefits, and alternatives were explained to the patient. Questions regarding the procedure were encouraged and answered. The patient understands and consents to the procedure. A time out was performed prior to initiating the procedure. Ultrasound was performed of the liver and lesions localized for sampling in the left lobe. The abdominal wall was prepped with chlorhexidine in a sterile fashion, and a sterile drape was applied covering the operative field. A sterile gown and sterile gloves were used for the procedure. Local anesthesia was provided with 1% Lidocaine. Under ultrasound guidance, a 17 gauge trocar needle was advanced into the left lobe of the liver. After confirming needle tip position, coaxial 18 gauge core biopsy samples were obtained of 2 separate adjacent liver lesions. A total of 4 core biopsy samples were obtained and submitted in formalin. After the procedure Gel-Foam pledgets were advanced through the outer needle as it was retracted and removed. COMPLICATIONS: None. FINDINGS: Innumerable small hypoechoic lesions are seen throughout the liver parenchyma. 2 adjacent lesions were sampled in the left lobe of the liver measuring roughly 1.0 and 1.3 cm in greatest transverse diameter respectively. Note is made on initial imaging additional biliary ductal dilatation as well, particularly in the left lobe of the liver. This raises the possibility of central biliary obstruction and eventual correlation is suggested with CT of the abdomen and pelvis with IV contrast. Solid tissue was obtained from the to adjacent lesions within the left lobe of the liver. IMPRESSION: Ultrasound-guided core biopsy performed of 2 adjacent solid hypoechoic lesions within the left lobe of the liver. Innumerable lesions are seen throughout the liver parenchyma as well as evidence of biliary obstruction with dilated intrahepatic ducts, particularly in  the left lobe. Recommend eventual correlation with CT of the chest, abdomen and pelvis for further staging. Electronically Signed   By: Aletta Edouard M.D.   On: 11/15/2017 14:33     Assessment/Plan Principal Problem:   Hepatic failure, acute Active Problems:   Breast cancer, stage 2 ([facility])   HTN (hypertension), benign   Tobacco abuse disorder   Liver lesion     #1 obstructive jaundice: Patient has intrahepatic lesions.  Suspected obstructive jaundice with sudden rising heart total bilirubin.  She will be admitted for possible ERCP with stenting.  Supportive care.  Will order MRCP.  GI has been consulted.  Patient also being followed by oncology.  #2 liver masses: Most likely metastatic disease with her markers being elevated.  Defer to oncology for any further care.  #3 hypertension: Continue home regimen.  Monitor closely.  #4 tobacco abuse: Initiate nicotine patch.  Tobacco cessation counseling.  #5 history of breast cancer: Not sure if liver lesion is breasts cancer recurrence.  Defer decision to oncology.   DVT prophylaxis: Heparin Code Status: Full code Family Communication: Patient's husband and daughter at bedside Disposition Plan: To be determined Consults called: Dr. Cristina Gong, gastroenterology Admission status: Inpatient  Severity of Illness: The appropriate patient status for this patient is INPATIENT. Inpatient status is judged to be reasonable and necessary in order to provide the required intensity of service to ensure the patient's safety. The patient's presenting symptoms, physical exam findings, and initial radiographic and laboratory data in the context of their chronic comorbidities is felt  to place them at high risk for further clinical deterioration. Furthermore, it is not anticipated that the patient will be medically stable for discharge from the hospital within 2 midnights of admission. The following factors support the patient status of inpatient.   " The  patient's presenting symptoms include jaundice with dull abdominal ache. " The worrisome physical exam findings include severe icteric jaundice. " The initial radiographic and laboratory data are worrisome because of elevated liver enzymes with total bilirubin of 11. " The chronic co-morbidities include recent hepatic masses.   * I certify that at the point of admission it is my clinical judgment that the patient will require inpatient hospital care spanning beyond 2 midnights from the point of admission due to high intensity of service, high risk for further deterioration and high frequency of surveillance required.Barbette Merino MD [facility] Pager [phone]  If 7PM-7AM, please contact night-coverage [email or web address] Password [facility]  11/15/2017, 10:46 PM

## 2017-11-15 NOTE — H&P (Signed)
Chief Complaint: Patient was seen in consultation today for liver lesion  Referring Physician(s): Chauncey Cruel  Supervising Physician: Aletta Edouard  Patient Status: [facility] - Out-pt  History of Present Illness: Caitlin Pollard is a 57 y.o. female with past medical history of HTN, IBD, and breast cancer s/p mastectomy in 1999.  She recently presented to her PCP who obtained an Abdominal US which showed multiple liver lesions.  Patient met with consultation with oncology who recommends liver lesion biopsy.  IR consulted for biopsy by Dr. Jana Hakim.   Patient presents in her usual state of health for procedure today.  She has been NPO.  She does not take blood thinners.   She brings a disc with her today for review which will be uploaded into PACS for IR MD.   Past Medical History:  Diagnosis Date  . Breast cancer ([place])   . Breast cancer, stage 2 ([place]) 04/05/2011  . HTN (hypertension), benign 04/05/2011  . Hyperglycemia without ketosis 04/05/2011  . IBD (inflammatory bowel disease) 04/05/2011    Past Surgical History:  Procedure Laterality Date  . AUGMENTATION MAMMAPLASTY Bilateral   . MASTECTOMY Right     Allergies: Cholestatin  Medications: Prior to Admission medications   Medication Sig Start Date End Date Taking? Authorizing Provider  calcium citrate-vitamin D 200-200 MG-UNIT TABS Take 2 tablets by mouth daily.     [provider]  cholecalciferol (VITAMIN D) 1000 UNITS tablet Take 2,000 Units by mouth daily.    [provider]  cyclobenzaprine (FLEXERIL) 10 MG tablet Take by mouth. 02/25/17   [provider]  FLUoxetine (PROZAC) 10 MG capsule One - two tablet a day as needed 07/14/17 07/15/18  [provider]  ibuprofen (ADVIL,MOTRIN) 200 MG tablet Take 200 mg by mouth every 6 (six) hours as needed.    [provider]  LORazepam (ATIVAN) 0.5 MG tablet Take 1 tablet (0.5 mg total) by mouth 2 (two) times daily. 11/12/17    Magrinat, Virgie Dad, MD  losartan (COZAAR) 100 MG tablet TAKE ONE TABLET BY MOUTH ONCE DAILY 07/14/17   [provider]  Multiple Vitamins-Minerals (HAIR/SKIN/NAILS/BIOTIN PO) Take 1 tablet by mouth daily.    [provider]  vitamin C (ASCORBIC ACID) 500 MG tablet Take 1,000 mg by mouth daily.    [provider]     No family history on file.  Social History   Socioeconomic History  . Marital status: Married    Spouse name: Not on file  . Number of children: Not on file  . Years of education: Not on file  . Highest education level: Not on file  Occupational History  . Not on file  Social Needs  . Financial resource strain: Not on file  . Food insecurity:    Worry: Not on file    Inability: Not on file  . Transportation needs:    Medical: Not on file    Non-medical: Not on file  Tobacco Use  . Smoking status: Not on file  Substance and Sexual Activity  . Alcohol use: Not on file  . Drug use: Not on file  . Sexual activity: Not on file  Lifestyle  . Physical activity:    Days per week: Not on file    Minutes per session: Not on file  . Stress: Not on file  Relationships  . Social connections:    Talks on phone: Not on file    Gets together: Not on file  Attends religious service: Not on file    Active member of club or organization: Not on file    Attends meetings of clubs or organizations: Not on file    Relationship status: Not on file  Other Topics Concern  . Not on file  Social History Narrative  . Not on file     Review of Systems: A 12 point ROS discussed and pertinent positives are indicated in the HPI above.  All other systems are negative.  Review of Systems  Constitutional: Negative for fatigue and fever.  Respiratory: Negative for cough and shortness of breath.   Cardiovascular: Negative for chest pain.  Gastrointestinal: Positive for abdominal pain.  Musculoskeletal: Negative for back pain.  Psychiatric/Behavioral:  Negative for behavioral problems and confusion.    Vital Signs: There were no vitals taken for this visit.  Physical Exam  Constitutional: She is oriented to person, place, and time. She appears well-developed. No distress.  Cardiovascular: Normal rate, regular rhythm and normal heart sounds. Exam reveals no gallop and no friction rub.  No murmur heard. Pulmonary/Chest: Effort normal and breath sounds normal. No respiratory distress.  Abdominal: Soft. She exhibits no distension. There is tenderness (upper abdomen, unchanged from baseline).  Neurological: She is alert and oriented to person, place, and time.  Skin: Skin is warm and dry. She is not diaphoretic.  Psychiatric: She has a normal mood and affect. Her behavior is normal. Judgment and thought content normal.  Nursing note and vitals reviewed.    MD Evaluation Airway: WNL Heart: WNL Abdomen: WNL Chest/ Lungs: WNL ASA  Classification: 2 Mallampati/Airway Score: One   Imaging: [place] Outside Films Body  Result Date: 11/15/2017 This examination belongs to an outside facility and is stored here for comparison purposes only.  Contact the originating outside institution for any associated report or interpretation.   Labs:  CBC: Recent Labs    11/11/17 1543 11/15/17 1113  WBC 17.6* 15.2*  HGB 12.8 12.8  HCT 37.8 38.0  PLT 305 263    COAGS: Recent Labs    11/15/17 1113  INR 0.91  APTT 27    BMP: Recent Labs    11/11/17 1543  NA 133*  K 4.5  CL 92*  CO2 28  GLUCOSE 109*  BUN 12  CALCIUM 10.1  CREATININE 0.72  GFRNONAA >60  GFRAA >60    LIVER FUNCTION TESTS: Recent Labs    11/11/17 1543  BILITOT 3.0*  AST 315*  ALT 260*  ALKPHOS 1,553*  PROT 7.3  ALBUMIN 3.3*    TUMOR MARKERS: No results for input(s): AFPTM, CEA, CA199, CHROMGRNA in the last 8760 hours.  Assessment and Plan: Patient with past medical history of breast cancer presents with complaint of abdominal pain, liver lesions.  IR  consulted for biopsy at the request of Dr. Jana Hakim. Case reviewed by Dr. Kathlene Cote who approves patient for procedure.  Patient presents today in their usual state of health.  She has been NPO and is not currently on blood thinners.   Risks and benefits discussed with the patient including, but not limited to bleeding, infection, damage to adjacent structures or low yield requiring additional tests.  All of the patient's questions were answered, patient is agreeable to proceed. Consent signed and in chart.   Thank you for this interesting consult.  I greatly enjoyed meeting Caitlin Pollard and look forward to participating in their care.  A copy of this report was sent to the requesting provider on this date.  Electronically Signed: Docia Barrier, PA 11/15/2017, 12:32 PM   I spent a total of  30 Minutes   in face to face in clinical consultation, greater than 50% of which was counseling/coordinating care for liver lesion.

## 2017-11-15 NOTE — Progress Notes (Signed)
COURTESY NOTE: Please see my note from 11/11/2017 for details of presentation. 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 (The slightly elevated CA 27-29, usually a "breast marker," is non-specific)  Caitlin Pollard had her liver Bx today-- we could have results as early as late tomorrow or at latest WEDS.  She will likely benefit from percutaneous drainage of the biliary obstruction. She is also scheduled for staging CT scans which should be done this admission as tolerated.   I have discussed the likely diagnosis with her husband today. I will see the patient in AM. Once we have pathologic confirmation I will refer Caitlin Pollard to one of my partners doing GI (Dr Burr Medico or Dr Benay Spice)  Greatly appreciate your help to this patient and her family!

## 2017-11-15 NOTE — Procedures (Signed)
Interventional Radiology Procedure Note  Procedure: US guided liver biopsy    Complications: None  Estimated Blood Loss: < 10 mL  Findings: Numerous small hypoechoic lesions throughout liver parenchyma.  18 G core biopsy x 4 via 17 G needle of 2 adjacent lesions in left lobe of liver.  Gelfoam pledgets advanced through outer needle on completion.  Venetia Night. Kathlene Cote, M.D Pager:  [phone]

## 2017-11-15 NOTE — Discharge Instructions (Signed)
Moderate Conscious Sedation, Adult, Care After °These instructions provide you with information about caring for yourself after your procedure. Your health care provider may also give you more specific instructions. Your treatment has been planned according to current medical practices, but problems sometimes occur. Call your health care provider if you have any problems or questions after your procedure. °What can I expect after the procedure? °After your procedure, it is common: °· To feel sleepy for several hours. °· To feel clumsy and have poor balance for several hours. °· To have poor judgment for several hours. °· To vomit if you eat too soon. ° °Follow these instructions at home: °For at least 24 hours after the procedure: ° °· Do not: °? Participate in activities where you could fall or become injured. °? Drive. °? Use heavy machinery. °? Drink alcohol. °? Take sleeping pills or medicines that cause drowsiness. °? Make important decisions or sign legal documents. °? Take care of children on your own. °· Rest. °Eating and drinking °· Follow the diet recommended by your health care provider. °· If you vomit: °? Drink water, juice, or soup when you can drink without vomiting. °? Make sure you have little or no nausea before eating solid foods. °General instructions °· Have a responsible adult stay with you until you are awake and alert. °· Take over-the-counter and prescription medicines only as told by your health care provider. °· If you smoke, do not smoke without supervision. °· Keep all follow-up visits as told by your health care provider. This is important. °Contact a health care provider if: °· You keep feeling nauseous or you keep vomiting. °· You feel light-headed. °· You develop a rash. °· You have a fever. °Get help right away if: °· You have trouble breathing. °This information is not intended to replace advice given to you by your health care provider. Make sure you discuss any questions you have  with your health care provider. °Document Released: 11/02/2012 Document Revised: 06/17/2015 Document Reviewed: 05/04/2015 °Elsevier Interactive Patient Education © 2018 [employer] ° ° °Liver Biopsy, Care After °These instructions give you information on caring for yourself after your procedure. Your doctor may also give you more specific instructions. Call your doctor if you have any problems or questions after your procedure. °Follow these instructions at home: °· Rest at home for 1-2 days or as told by your doctor. °· Have someone stay with you for at least 24 hours. °· Do not do these things in the first 24 hours: °? Drive. °? Use machinery. °? Take care of other people. °? Sign legal documents. °? Take a bath or shower. °· There are many different ways to close and cover a cut (incision). For example, a cut can be closed with stitches, skin glue, or adhesive strips. Follow your doctor's instructions on: °? Taking care of your cut. °? Changing and removing your bandage (dressing). °? Removing whatever was used to close your cut. °· Do not drink alcohol in the first week. °· Do not lift more than 5 pounds or play contact sports for the first 2 weeks. °· Take medicines only as told by your doctor. For 1 week, do not take medicine that has aspirin in it or medicines like ibuprofen. °· Get your test results. °Contact a doctor if: °· A cut bleeds and leaves more than just a small spot of blood. °· A cut is red, puffs up (swells), or hurts more than before. °· Fluid or something else   comes from a cut. °· A cut smells bad. °· You have a fever or chills. °Get help right away if: °· You have swelling, bloating, or pain in your belly (abdomen). °· You get dizzy or faint. °· You have a rash. °· You feel sick to your stomach (nauseous) or throw up (vomit). °· You have trouble breathing, feel short of breath, or feel faint. °· Your chest hurts. °· You have problems talking or seeing. °· You have trouble balancing or moving  your arms or legs. °This information is not intended to replace advice given to you by your health care provider. Make sure you discuss any questions you have with your health care provider. °Document Released: 10/22/2007 Document Revised: 06/20/2015 Document Reviewed: 03/10/2013 °Elsevier Interactive Patient Education © 2018 [employer] ° ° °

## 2017-11-15 NOTE — ED Provider Notes (Signed)
[facility] DEPT Provider Note   CSN: [id number] Arrival date & time: 11/15/17  1537     History   Chief Complaint Chief Complaint  Patient presents with  . Jaundice    HPI Caitlin Pollard is a 57 y.o. female.  HPI Patient has history of breast cancer treated in 1999.  After right mastectomy, she was treated with adjuvant chemotherapy then tamoxifen from 2000-2009, then letrozole for 1 year.  She was then released from follow-up.  She had left breast imaging [id number] which did not show suspicious malignancy changes. Approximately 1 month ago she started getting some cough and thoracic and back pain that was thought to be due to bronchitis.  She had also been playing tennis and thought that may be her thoracic pain was due to muscular strain.  60\73\71, she was evaluated for right upper abdominal pain and liver was identified to have multiple lesions suspicious for metastatic disease.  Patient has seen Dr. Jana Hakim [phone].  She had a ultrasound-guided biopsy today.  Notably however as of yesterday she started becoming quickly jaundiced.  This preceded her biopsy.  She reports she is having some diffuse posterior thoracic pain.  She does not identify that her right upper quadrant pain is worse or rapidly changing since her biopsy.  Her main concern presenting today aside from ongoing pain that seems consistent with what preceded the biopsy, is a concern for rapidly progressive visible jaundice and icterus.  The patient denies she had any vomiting for several days.  She has never vomited blood.  She reports she continues to have bowel movements and does not feel that she has constipation.  Her urine has turned very orange in color since yesterday.  She denies fever or shortness of breath.  No lower extremity swelling or calf pain.  Patient is imagings thus far is for abdominal ultrasound.  She is scheduled to get CT abdomen chest and pelvis for staging purposes in  about another 3 days. Past Medical History:  Diagnosis Date  . Breast cancer ([place])   . Breast cancer, stage 2 ([place]) 04/05/2011  . HTN (hypertension), benign 04/05/2011  . Hyperglycemia without ketosis 04/05/2011  . IBD (inflammatory bowel disease) 04/05/2011    Patient Active Problem List   Diagnosis Date Noted  . Hepatic failure, acute 11/15/2017  . Tobacco abuse disorder 11/11/2017  . Malignant neoplasm of overlapping sites of right breast in female, estrogen receptor positive ([place]) 11/11/2017  . Liver lesion 11/11/2017  . Bronchitis, chronic ([place]) 11/11/2017  . Breast cancer, stage 2 ([place]) 04/05/2011  . IBD (inflammatory bowel disease) 04/05/2011  . HTN (hypertension), benign 04/05/2011  . Hyperglycemia without ketosis 04/05/2011    Past Surgical History:  Procedure Laterality Date  . ABDOMINAL HYSTERECTOMY    . AUGMENTATION MAMMAPLASTY Bilateral   . KNEE SURGERY    . MASTECTOMY Right   . WRIST SURGERY       OB History   None      Home Medications    Prior to Admission medications   Medication Sig Start Date End Date Taking? Authorizing Provider  Calcium Carb-Cholecalciferol (CALCIUM 1000 + D PO) Take 1 tablet by mouth daily.   Yes [provider]  cholecalciferol (VITAMIN D) 1000 UNITS tablet Take 2,000 Units by mouth daily.   Yes [provider]  cyclobenzaprine (FLEXERIL) 10 MG tablet Take by mouth. 02/25/17  Yes [provider]  FLUoxetine (PROZAC) 10 MG capsule Take 10 mg by mouth daily.  07/14/17 07/15/18 Yes [provider]  ibuprofen (ADVIL,MOTRIN) 200 MG tablet Take 200 mg by mouth every 6 (six) hours as needed.   Yes [provider]  LORazepam (ATIVAN) 0.5 MG tablet Take 1 tablet (0.5 mg total) by mouth 2 (two) times daily. 11/12/17  Yes Magrinat, Virgie Dad, MD  losartan (COZAAR) 100 MG tablet Take 100 mg by mouth daily.  07/14/17  Yes [provider]    Family History Family History  Problem Relation Age  of Onset  . Cancer Father     Social History Social History   Tobacco Use  . Smoking status: Never Smoker  . Smokeless tobacco: Never Used  Substance Use Topics  . Alcohol use: Yes  . Drug use: Never     Allergies   Cholestatin   Review of Systems Review of Systems 10 Systems reviewed and are negative for acute change except as noted in the HPI.   Physical Exam Updated Vital Signs BP (!) 154/96 (BP Location: Right Arm)   Pulse 97   Temp 98.1 F (36.7 C) (Oral)   Resp 20   Ht 5\' 7"  (1.702 m)   Wt 63.1 kg   SpO2 97%   BMI 21.79 kg/m   Physical Exam  Constitutional: She is oriented to person, place, and time.  She is alert and nontoxic.  She is well-nourished well-developed.  No respiratory distress.  Clearly jaundiced.  HENT:  Head: Normocephalic and atraumatic.  Mouth/Throat: Oropharynx is clear and moist.  Eyes: Pupils are equal, round, and reactive to light. EOM are normal. Scleral icterus is present.  Neck: Neck supple.  Cardiovascular: Normal rate, regular rhythm, normal heart sounds and intact distal pulses.  Pulmonary/Chest: Effort normal and breath sounds normal.  Abdominal: Soft.  Patient's abdomen is very mildly distended.  Does not have tenderness to palpation of the lower abdomen or left lateral abdomen.  Peritoneal signs.  I can create fluid wave without pain.  Patient does have some tenderness in the right upper quadrant and palpable firmness which appears to be hepatic.  This area is tender.  Musculoskeletal: Normal range of motion. She exhibits no edema or tenderness.  Neurological: She is alert and oriented to person, place, and time. No cranial nerve deficit. She exhibits normal muscle tone. Coordination normal.  Skin: Skin is warm and dry.  Patient is visibly jaundiced.  Skin is otherwise warm and dry.  Psychiatric: She has a normal mood and affect.     ED Treatments / Results  Labs (all labs ordered are listed, but only abnormal results  are displayed) Labs Reviewed  COMPREHENSIVE METABOLIC PANEL - Abnormal; Notable for the following components:      Result Value   Sodium 130 (*)    Glucose, Bld 119 (*)    Calcium 8.7 (*)    Albumin 3.1 (*)    AST 152 (*)    ALT 167 (*)    Alkaline Phosphatase 1,233 (*)    Total Bilirubin 11.1 (*)    All other components within normal limits  CBC WITH DIFFERENTIAL/PLATELET - Abnormal; Notable for the following components:   WBC 13.4 (*)    RBC 3.66 (*)    Hemoglobin 11.7 (*)    HCT 34.6 (*)    Neutro Abs 10.8 (*)    Monocytes Absolute 1.6 (*)    Abs Immature Granulocytes 0.10 (*)    All other components within normal limits  URINALYSIS, ROUTINE W REFLEX MICROSCOPIC - Abnormal; Notable for the following  components:   Color, Urine AMBER (*)    APPearance HAZY (*)    Bilirubin Urine MODERATE (*)    Ketones, ur 5 (*)    Protein, ur 30 (*)    All other components within normal limits  AMMONIA - Abnormal; Notable for the following components:   Ammonia 65 (*)    All other components within normal limits  CULTURE, BLOOD (ROUTINE X 2)  CULTURE, BLOOD (ROUTINE X 2)  LIPASE, BLOOD  PROTIME-INR  HIV ANTIBODY (ROUTINE TESTING W REFLEX)  COMPREHENSIVE METABOLIC PANEL  CBC  I-STAT CG4 LACTIC ACID, ED    EKG None  Radiology Ct Chest W Contrast  Result Date: 11/15/2017 CLINICAL DATA:  Liver lesions post ultrasound-guided biopsy today. Jaundice. Diffuse pain. Listed history of breast cancer in electronic medical record. EXAM: CT CHEST, ABDOMEN, AND PELVIS WITH CONTRAST TECHNIQUE: Multidetector CT imaging of the chest, abdomen and pelvis was performed following the standard protocol during bolus administration of intravenous contrast. CONTRAST:  119mL ISOVUE-300 IOPAMIDOL (ISOVUE-300) INJECTION 61% COMPARISON:  Ultrasound 11/11/2017 FINDINGS: CT CHEST FINDINGS Cardiovascular: Aortic atherosclerosis without aneurysm. The heart is normal in size. There are coronary artery calcifications.  No pericardial fluid. Mediastinum/Nodes: No enlarged hilar nodes. 6 mm right epicardial node, no other mediastinal adenopathy. Small lymph nodes in the left axilla largest measuring 7 mm. Surgical clips in the right axilla without right axillary adenopathy. Heterogeneous left lobe of the thyroid gland with multiple small nodules, largest measuring 10 mm. The esophagus is decompressed. Lungs/Pleura: Calcified granuloma in the right upper lobe series 6, image 36. Small noncalcified nodules including punctate right upper lobe nodule image 35, 4 mm left upper lobe nodule image 65, punctate subpleural right middle lobe nodule image 84, and 3 mm left lower lobe nodule image 94. No confluent airspace disease. No pleural fluid. Musculoskeletal: Primarily sclerotic lesion of T10 with diffuse sclerosis of the the vertebral body, and moth-eaten appearance of both lamina, sclerosis of the transverse and spinous processes. Sclerotic lesions within posterior T9 vertebral body extending into the left lamina. Mixed lytic and sclerotic lesion posterior T8 vertebral body with likely extension to the left lamina. Sclerotic lesion posterior T11 vertebral body. Trabecular coarsening of T4 vertebral body may simply represent a hemangioma but is nonspecific. Bilateral breast implants. CT ABDOMEN PELVIS FINDINGS Hepatobiliary: Innumerable defined low-density lesions throughout the liver consistent with metastatic disease. Conglomerate lesion in the periphery measures approximately 6.8 x 10 cm with slight capsular retraction. Second dominant lesion in the right lobe measures 8.3 x 5.7 cm. There is intrahepatic biliary ductal dilatation. Dilatation of the common bile duct to 16 mm with abrupt transition, image 52 series 4 with associated soft tissue density. Gallbladder partially distended with diffuse gallbladder wall thickening. Small amount perihepatic ascites, measuring simple fluid density. No evidence of hepatic biopsy complication or  hemorrhage. Pancreas: Soft tissue fullness in the uncinate process favored to represent adjacent adenopathy rather than focal pancreatic lesion, however difficult to delineate on CT. No ductal dilatation or inflammation. Spleen: Calcified granuloma. No cystic or solid lesion. Spleen is normal in size. Adrenals/Urinary Tract: Normal adrenal glands without adrenal nodule. No hydronephrosis or perinephric edema. Homogeneous renal enhancement with symmetric excretion on delayed phase imaging. Urinary bladder is physiologically distended without wall thickening. Stomach/Bowel: Lack of enteric contrast limits bowel assessment. No evidence bowel wall thickening, inflammatory change or obstruction. No obvious focal bowel lesion. Moderate stool in the proximal colon. Normal appendix. Vascular/Lymphatic: Aortic atherosclerosis without aneurysm. Bi-iliac atherosclerosis. Portal vein is patent  at the porta hepatis, attenuated at the porta splenic confluence, possible occluded with surrounding collaterals. Splenic vein is patent. Heterogeneous appearance of the superior mesenteric vein may be due to contrast mixing, no discrete thrombus. Suspected porta hepatis and portacaval adenopathy, with ill-defined soft tissue density the portal caval region, difficult to delineate from adjacent structures. Small retroperitoneal nodes. No pelvic adenopathy. Reproductive: Status post hysterectomy. No adnexal masses. Other: Small volume of perihepatic and pelvic ascites.  No free air. Musculoskeletal: Sclerotic lesion within L1 vertebral body extends into the left lamina. Scattered lumbar degenerative change. IMPRESSION: 1. Hepatic metastatic disease with innumerable liver lesions, biopsied earlier today. No evidence of post biopsy hemorrhage or complication. 2. Intrahepatic and proximal extrahepatic biliary ductal dilatation, abrupt transition proximally in the common duct with associated soft tissue density, which may represent adenopathy  but is not well delineated. Ill-defined soft tissue density in the porta hepatis and portacaval region is likely adenopathy. This is contiguous with the uncinate process of the pancreas, focal pancreatic lesion difficult to exclude. No pancreatic ductal dilatation. 3. Attenuated portal vein at the portal splenic confluence, possibly occluded with suspected portal collaterals. Portal vein is otherwise patent. 4. Osseous metastatic disease with primarily sclerotic lesions, primarily involving the spine. Lesions involve T8, T9, T10, T11, and L1. Indeterminate lesion at T4 versus hemangioma. T10 lesion involves the entire vertebral body, with heterogeneous appearance of the lamina and possible extraosseous component and mass-effect on the spinal canal. Recommend thoracic spine MRI to evaluate for cord compression. 5. Nonspecific small pulmonary nodules, largest measuring 4 mm in the left upper lobe. 6. Small amount of perihepatic and pelvic ascites. 7. Aortic Atherosclerosis (ICD10-I70.0). Coronary artery calcifications. Electronically Signed   By: Keith Rake M.D.   On: 11/15/2017 22:09   Ct Abdomen Pelvis W Contrast  Result Date: 11/15/2017 CLINICAL DATA:  Liver lesions post ultrasound-guided biopsy today. Jaundice. Diffuse pain. Listed history of breast cancer in electronic medical record. EXAM: CT CHEST, ABDOMEN, AND PELVIS WITH CONTRAST TECHNIQUE: Multidetector CT imaging of the chest, abdomen and pelvis was performed following the standard protocol during bolus administration of intravenous contrast. CONTRAST:  127mL ISOVUE-300 IOPAMIDOL (ISOVUE-300) INJECTION 61% COMPARISON:  Ultrasound 11/11/2017 FINDINGS: CT CHEST FINDINGS Cardiovascular: Aortic atherosclerosis without aneurysm. The heart is normal in size. There are coronary artery calcifications. No pericardial fluid. Mediastinum/Nodes: No enlarged hilar nodes. 6 mm right epicardial node, no other mediastinal adenopathy. Small lymph nodes in the  left axilla largest measuring 7 mm. Surgical clips in the right axilla without right axillary adenopathy. Heterogeneous left lobe of the thyroid gland with multiple small nodules, largest measuring 10 mm. The esophagus is decompressed. Lungs/Pleura: Calcified granuloma in the right upper lobe series 6, image 36. Small noncalcified nodules including punctate right upper lobe nodule image 35, 4 mm left upper lobe nodule image 65, punctate subpleural right middle lobe nodule image 84, and 3 mm left lower lobe nodule image 94. No confluent airspace disease. No pleural fluid. Musculoskeletal: Primarily sclerotic lesion of T10 with diffuse sclerosis of the the vertebral body, and moth-eaten appearance of both lamina, sclerosis of the transverse and spinous processes. Sclerotic lesions within posterior T9 vertebral body extending into the left lamina. Mixed lytic and sclerotic lesion posterior T8 vertebral body with likely extension to the left lamina. Sclerotic lesion posterior T11 vertebral body. Trabecular coarsening of T4 vertebral body may simply represent a hemangioma but is nonspecific. Bilateral breast implants. CT ABDOMEN PELVIS FINDINGS Hepatobiliary: Innumerable defined low-density lesions throughout the liver consistent  with metastatic disease. Conglomerate lesion in the periphery measures approximately 6.8 x 10 cm with slight capsular retraction. Second dominant lesion in the right lobe measures 8.3 x 5.7 cm. There is intrahepatic biliary ductal dilatation. Dilatation of the common bile duct to 16 mm with abrupt transition, image 52 series 4 with associated soft tissue density. Gallbladder partially distended with diffuse gallbladder wall thickening. Small amount perihepatic ascites, measuring simple fluid density. No evidence of hepatic biopsy complication or hemorrhage. Pancreas: Soft tissue fullness in the uncinate process favored to represent adjacent adenopathy rather than focal pancreatic lesion, however  difficult to delineate on CT. No ductal dilatation or inflammation. Spleen: Calcified granuloma. No cystic or solid lesion. Spleen is normal in size. Adrenals/Urinary Tract: Normal adrenal glands without adrenal nodule. No hydronephrosis or perinephric edema. Homogeneous renal enhancement with symmetric excretion on delayed phase imaging. Urinary bladder is physiologically distended without wall thickening. Stomach/Bowel: Lack of enteric contrast limits bowel assessment. No evidence bowel wall thickening, inflammatory change or obstruction. No obvious focal bowel lesion. Moderate stool in the proximal colon. Normal appendix. Vascular/Lymphatic: Aortic atherosclerosis without aneurysm. Bi-iliac atherosclerosis. Portal vein is patent at the porta hepatis, attenuated at the porta splenic confluence, possible occluded with surrounding collaterals. Splenic vein is patent. Heterogeneous appearance of the superior mesenteric vein may be due to contrast mixing, no discrete thrombus. Suspected porta hepatis and portacaval adenopathy, with ill-defined soft tissue density the portal caval region, difficult to delineate from adjacent structures. Small retroperitoneal nodes. No pelvic adenopathy. Reproductive: Status post hysterectomy. No adnexal masses. Other: Small volume of perihepatic and pelvic ascites.  No free air. Musculoskeletal: Sclerotic lesion within L1 vertebral body extends into the left lamina. Scattered lumbar degenerative change. IMPRESSION: 1. Hepatic metastatic disease with innumerable liver lesions, biopsied earlier today. No evidence of post biopsy hemorrhage or complication. 2. Intrahepatic and proximal extrahepatic biliary ductal dilatation, abrupt transition proximally in the common duct with associated soft tissue density, which may represent adenopathy but is not well delineated. Ill-defined soft tissue density in the porta hepatis and portacaval region is likely adenopathy. This is contiguous with the  uncinate process of the pancreas, focal pancreatic lesion difficult to exclude. No pancreatic ductal dilatation. 3. Attenuated portal vein at the portal splenic confluence, possibly occluded with suspected portal collaterals. Portal vein is otherwise patent. 4. Osseous metastatic disease with primarily sclerotic lesions, primarily involving the spine. Lesions involve T8, T9, T10, T11, and L1. Indeterminate lesion at T4 versus hemangioma. T10 lesion involves the entire vertebral body, with heterogeneous appearance of the lamina and possible extraosseous component and mass-effect on the spinal canal. Recommend thoracic spine MRI to evaluate for cord compression. 5. Nonspecific small pulmonary nodules, largest measuring 4 mm in the left upper lobe. 6. Small amount of perihepatic and pelvic ascites. 7. Aortic Atherosclerosis (ICD10-I70.0). Coronary artery calcifications. Electronically Signed   By: Keith Rake M.D.   On: 11/15/2017 22:09   US Biopsy (liver)  Result Date: 11/15/2017 CLINICAL DATA:  Multiple liver lesions by prior ultrasound. The patient presents for biopsy. EXAM: ULTRASOUND GUIDED CORE BIOPSY OF LIVER COMPARISON:  Prior abdominal ultrasound at [place] on 11/11/2017 MEDICATIONS: 2.0 mg IV Versed; 100 mcg IV Fentanyl Total Moderate Sedation Time: 20 minutes. The patient's level of consciousness and physiologic status were continuously monitored during the procedure by Radiology nursing. PROCEDURE: The procedure, risks, benefits, and alternatives were explained to the patient. Questions regarding the procedure were encouraged and answered. The patient understands and consents to the procedure. A  time out was performed prior to initiating the procedure. Ultrasound was performed of the liver and lesions localized for sampling in the left lobe. The abdominal wall was prepped with chlorhexidine in a sterile fashion, and a sterile drape was applied covering the operative field. A sterile  gown and sterile gloves were used for the procedure. Local anesthesia was provided with 1% Lidocaine. Under ultrasound guidance, a 17 gauge trocar needle was advanced into the left lobe of the liver. After confirming needle tip position, coaxial 18 gauge core biopsy samples were obtained of 2 separate adjacent liver lesions. A total of 4 core biopsy samples were obtained and submitted in formalin. After the procedure Gel-Foam pledgets were advanced through the outer needle as it was retracted and removed. COMPLICATIONS: None. FINDINGS: Innumerable small hypoechoic lesions are seen throughout the liver parenchyma. 2 adjacent lesions were sampled in the left lobe of the liver measuring roughly 1.0 and 1.3 cm in greatest transverse diameter respectively. Note is made on initial imaging additional biliary ductal dilatation as well, particularly in the left lobe of the liver. This raises the possibility of central biliary obstruction and eventual correlation is suggested with CT of the abdomen and pelvis with IV contrast. Solid tissue was obtained from the to adjacent lesions within the left lobe of the liver. IMPRESSION: Ultrasound-guided core biopsy performed of 2 adjacent solid hypoechoic lesions within the left lobe of the liver. Innumerable lesions are seen throughout the liver parenchyma as well as evidence of biliary obstruction with dilated intrahepatic ducts, particularly in the left lobe. Recommend eventual correlation with CT of the chest, abdomen and pelvis for further staging. Electronically Signed   By: Aletta Edouard M.D.   On: 11/15/2017 14:33    Procedures Procedures (including critical care time)  Medications Ordered in ED Medications  iopamidol (ISOVUE-300) 61 % injection (has no administration in time range)  cholecalciferol (VITAMIN D) tablet 2,000 Units (has no administration in time range)  FLUoxetine (PROZAC) capsule 10 mg (has no administration in time range)  losartan (COZAAR) tablet  100 mg (has no administration in time range)  LORazepam (ATIVAN) tablet 0.5 mg (has no administration in time range)  calcium-vitamin D (OSCAL WITH D) 500-200 MG-UNIT per tablet 2 tablet (has no administration in time range)  dextrose 5 %-0.9 % sodium chloride infusion ( Intravenous New Bag/Given 11/16/17 0038)  ondansetron (ZOFRAN) tablet 4 mg ( Oral See Alternative 11/16/17 0031)    Or  ondansetron (ZOFRAN) injection 4 mg (4 mg Intravenous Given 11/16/17 0031)  HYDROmorphone (DILAUDID) injection 1 mg (has no administration in time range)  LORazepam (ATIVAN) injection 1 mg (1 mg Intravenous Given 11/15/17 2329)  nicotine (NICODERM CQ - dosed in mg/24 hours) patch 21 mg (has no administration in time range)  HYDROmorphone (DILAUDID) injection 0.5 mg (0.5 mg Intravenous Given 11/15/17 1657)  0.9 %  sodium chloride infusion ( Intravenous New Bag/Given 11/15/17 1707)  famotidine (PEPCID) IVPB 20 mg premix ( Intravenous Stopped 11/15/17 2007)  iopamidol (ISOVUE-300) 61 % injection 100 mL (100 mLs Intravenous Contrast Given 11/15/17 1953)  sodium chloride 0.9 % injection (1,000 mLs  Given 11/15/17 2140)     Initial Impression / Assessment and Plan / ED Course  I have reviewed the triage vital signs and the nursing notes.  Pertinent labs & imaging results that were available during my care of the patient were reviewed by me and considered in my medical decision making (see chart for details).  Clinical Course as of Oct 22  [place] Nov 15, 2017  1826 Order placed for oncology consult.   [MP]  1839 Consult: Oncologist agrees with proceeding with  CTs and hospital admission.   [MP]  2230 Consult to gastroenterology and hospitalist placed.   [MP]    Clinical Course User Index [MP] Charlesetta Shanks, MD   Patient had pre-identified multiple liver nodules.  She had just undergone biopsy today.  Patient however has been getting worse quickly in terms of developing jaundice over the past 2 days.   She has not had fever or vomiting.  CT scan confirms multiple hepatic lesions which appear to be obstructive with concomitant biliary duct dilation.  As well, bony tumors of the spine are identified.  Consultations have been made to oncology, gastroenterology and admission physician.  Final Clinical Impressions(s) / ED Diagnoses   Final diagnoses:  Metastatic cancer to liver [facility])  Jaundice    ED Discharge Orders    None       Charlesetta Shanks, MD 11/16/17 [phone]

## 2017-11-15 NOTE — Addendum Note (Signed)
Addended by: Chauncey Cruel on: 11/15/2017 02:48 PM   Modules accepted: Orders

## 2017-11-15 NOTE — H&P (View-Only) (Signed)
History and Physical   Caitlin Pollard YJE:[id number] DOB: [birth year] DOA: 11/15/2017  Referring MD/NP/PA: Dr. Johnney Killian  PCP: Chesley Noon, MD   Outpatient Specialists: Dr. Jana Hakim  Patient coming from: Home  Chief Complaint: Jaundice   HPI: Caitlin Pollard is a 57 y.o. female with medical history significant of breast cancer diagnosed in 1999 and treated.  Patient took tamoxifen for 10 years thereafter and stopped after she was cleared.  She also try letrozole for 1 year after that.  Patient has had follow-up including previous pancreatic lesion in 2006 with biopsy done at [facility] which was found to be benign.  She has hypertension as well as inflammatory bowel disease.  Patient has been doing fine until about a month ago when she started getting some cough thoracic and back pain.  She went to see her primary care physician and at the time was thought to have bronchitis.  She later has right upper quadrant abdominal pain that was evaluated with a scan coming back showing multiple hepatic lesions suspected to be metastatic disease.  She was seen by oncology and had biopsy done today for the liver.  Patient came to the ER afterwards because of worsening symptoms including jaundice and weakness.  Patient is in tears at the moment with family at bedside.  She is having some dull ache in the area and very anxious.  She has severe jaundice with elevated LFTs including total bilirubin of 11.  Gastroenterology has been consulted and patient is being placed in the hospital for further work-up..  ED Course: Temperature is 98.7 blood pressure 160/110 pulse 99 respiratory of 20 oxygen sats 95% room air.  White count is 13.4 hemoglobin 11.7 and platelet 258.  Sodium 130 potassium 3.9 chloride 98 with CO2 25 and creatinine 0.61 glucose 119.  Ammonia level is 65 alkaline phosphatase 1233 albumin 3.1 AST 152 ALT 167 total protein, total bilirubin is 11, it was only 3 4 days ago.  66 lactic acid 1.05.   Patient had recent CA-19-9 of 15,854 CA 27.29 is 149.7 and CEA is 10.64.  PT/INR is within normal.  She is being admitted for further work-up of obstructive jaundice.  Review of Systems: As per HPI otherwise 10 point review of systems negative.    Past Medical History:  Diagnosis Date  . Breast cancer ([place])   . Breast cancer, stage 2 ([place]) 04/05/2011  . HTN (hypertension), benign 04/05/2011  . Hyperglycemia without ketosis 04/05/2011  . IBD (inflammatory bowel disease) 04/05/2011    Past Surgical History:  Procedure Laterality Date  . ABDOMINAL HYSTERECTOMY    . AUGMENTATION MAMMAPLASTY Bilateral   . KNEE SURGERY    . MASTECTOMY Right   . WRIST SURGERY       reports that she has never smoked. She has never used smokeless tobacco. She reports that she drinks alcohol. She reports that she does not use drugs.  Allergies  Allergen Reactions  . Cholestatin Other (See Comments)    Runny eyes--pt. Denies this allergy    Family History  Problem Relation Age of Onset  . Cancer Father      Prior to Admission medications   Medication Sig Start Date End Date Taking? Authorizing Provider  Calcium Carb-Cholecalciferol (CALCIUM 1000 + D PO) Take 1 tablet by mouth daily.   Yes [provider]  cholecalciferol (VITAMIN D) 1000 UNITS tablet Take 2,000 Units by mouth daily.   Yes [provider]  cyclobenzaprine (FLEXERIL) 10 MG tablet Take  by mouth. 02/25/17  Yes [provider]  FLUoxetine (PROZAC) 10 MG capsule Take 10 mg by mouth daily.  07/14/17 07/15/18 Yes [provider]  ibuprofen (ADVIL,MOTRIN) 200 MG tablet Take 200 mg by mouth every 6 (six) hours as needed.   Yes [provider]  LORazepam (ATIVAN) 0.5 MG tablet Take 1 tablet (0.5 mg total) by mouth 2 (two) times daily. 11/12/17  Yes Magrinat, Virgie Dad, MD  losartan (COZAAR) 100 MG tablet Take 100 mg by mouth daily.  07/14/17  Yes [provider]    Physical Exam: Vitals:    11/15/17 1708 11/15/17 1753 11/15/17 1837 11/15/17 2230  BP: (!) 141/94 (!) 143/97 (!) 146/100 (!) 160/110  Pulse: 86 84 87 90  Resp: 18 18 18 19   Temp: 98.1 F (36.7 C) 98.7 F (37.1 C) 98.4 F (36.9 C)   TempSrc: Oral Oral Oral   SpO2: 100% 97% 99% 97%  Weight:      Height:          Constitutional: NAD, calm, comfortable Vitals:   11/15/17 1708 11/15/17 1753 11/15/17 1837 11/15/17 2230  BP: (!) 141/94 (!) 143/97 (!) 146/100 (!) 160/110  Pulse: 86 84 87 90  Resp: 18 18 18 19   Temp: 98.1 F (36.7 C) 98.7 F (37.1 C) 98.4 F (36.9 C)   TempSrc: Oral Oral Oral   SpO2: 100% 97% 99% 97%  Weight:      Height:       Eyes: Icteric jaundice ENMT: Mucous membranes are moist. Posterior pharynx clear of any exudate or lesions.Normal dentition.  Neck: normal, supple, no masses, no thyromegaly Respiratory: clear to auscultation bilaterally, no wheezing, no crackles. Normal respiratory effort. No accessory muscle use.  Cardiovascular: Regular rate and rhythm, no murmurs / rubs / gallops. No extremity edema. 2+ pedal pulses. No carotid bruits.  Abdomen: Mild tenderness, no masses palpated. No hepatosplenomegaly. Bowel sounds positive.  Musculoskeletal: no clubbing / cyanosis. No joint deformity upper and lower extremities. Good ROM, no contractures. Normal muscle tone.  Skin: no rashes, lesions, ulcers. No induration Neurologic: CN 2-12 grossly intact. Sensation intact, DTR normal. Strength 5/5 in all 4.  Psychiatric: Depressed mood.     Labs on Admission: I have personally reviewed following labs and imaging studies  CBC: Recent Labs  Lab 11/11/17 1543 11/15/17 1113 11/15/17 1706  WBC 17.6* 15.2* 13.4*  NEUTROABS 14.8*  --  10.8*  HGB 12.8 12.8 11.7*  HCT 37.8 38.0 34.6*  MCV 96.4 95.2 94.5  PLT 305 263 329   Basic Metabolic Panel: Recent Labs  Lab 11/11/17 1543 11/15/17 1706  NA 133* 130*  K 4.5 3.9  CL 92* 98  CO2 28 25  GLUCOSE 109* 119*  BUN 12 15    CREATININE 0.72 0.61  CALCIUM 10.1 8.7*   GFR: Estimated Creatinine Clearance: 74.5 mL/min (by C-G formula based on SCr of 0.61 mg/dL). Liver Function Tests: Recent Labs  Lab 11/11/17 1543 11/15/17 1706  AST 315* 152*  ALT 260* 167*  ALKPHOS 1,553* 1,233*  BILITOT 3.0* 11.1*  PROT 7.3 6.6  ALBUMIN 3.3* 3.1*   Recent Labs  Lab 11/15/17 1706  LIPASE 36   Recent Labs  Lab 11/15/17 1706  AMMONIA 65*   Coagulation Profile: Recent Labs  Lab 11/15/17 1113 11/15/17 1706  INR 0.91 0.95   Cardiac Enzymes: No results for input(s): CKTOTAL, CKMB, CKMBINDEX, TROPONINI in the last 168 hours. BNP (last 3 results) No results for input(s): PROBNP in the  last 8760 hours. HbA1C: No results for input(s): HGBA1C in the last 72 hours. CBG: No results for input(s): GLUCAP in the last 168 hours. Lipid Profile: No results for input(s): CHOL, HDL, LDLCALC, TRIG, CHOLHDL, LDLDIRECT in the last 72 hours. Thyroid Function Tests: No results for input(s): TSH, T4TOTAL, FREET4, T3FREE, THYROIDAB in the last 72 hours. Anemia Panel: No results for input(s): VITAMINB12, FOLATE, FERRITIN, TIBC, IRON, RETICCTPCT in the last 72 hours. Urine analysis:    Component Value Date/Time   COLORURINE AMBER (A) 11/15/2017 1724   APPEARANCEUR HAZY (A) 11/15/2017 1724   LABSPEC 1.018 11/15/2017 1724   PHURINE 6.0 11/15/2017 1724   GLUCOSEU NEGATIVE 11/15/2017 1724   HGBUR NEGATIVE 11/15/2017 1724   BILIRUBINUR MODERATE (A) 11/15/2017 1724   KETONESUR 5 (A) 11/15/2017 1724   PROTEINUR 30 (A) 11/15/2017 1724   NITRITE NEGATIVE 11/15/2017 1724   LEUKOCYTESUR NEGATIVE 11/15/2017 1724   Sepsis Labs: @LABRCNTIP (procalcitonin:4,lacticidven:4) )No results found for this or any previous visit (from the past 240 hour(s)).   Radiological Exams on Admission: Ct Chest W Contrast  Result Date: 11/15/2017 CLINICAL DATA:  Liver lesions post ultrasound-guided biopsy today. Jaundice. Diffuse pain. Listed  history of breast cancer in electronic medical record. EXAM: CT CHEST, ABDOMEN, AND PELVIS WITH CONTRAST TECHNIQUE: Multidetector CT imaging of the chest, abdomen and pelvis was performed following the standard protocol during bolus administration of intravenous contrast. CONTRAST:  134mL ISOVUE-300 IOPAMIDOL (ISOVUE-300) INJECTION 61% COMPARISON:  Ultrasound 11/11/2017 FINDINGS: CT CHEST FINDINGS Cardiovascular: Aortic atherosclerosis without aneurysm. The heart is normal in size. There are coronary artery calcifications. No pericardial fluid. Mediastinum/Nodes: No enlarged hilar nodes. 6 mm right epicardial node, no other mediastinal adenopathy. Small lymph nodes in the left axilla largest measuring 7 mm. Surgical clips in the right axilla without right axillary adenopathy. Heterogeneous left lobe of the thyroid gland with multiple small nodules, largest measuring 10 mm. The esophagus is decompressed. Lungs/Pleura: Calcified granuloma in the right upper lobe series 6, image 36. Small noncalcified nodules including punctate right upper lobe nodule image 35, 4 mm left upper lobe nodule image 65, punctate subpleural right middle lobe nodule image 84, and 3 mm left lower lobe nodule image 94. No confluent airspace disease. No pleural fluid. Musculoskeletal: Primarily sclerotic lesion of T10 with diffuse sclerosis of the the vertebral body, and moth-eaten appearance of both lamina, sclerosis of the transverse and spinous processes. Sclerotic lesions within posterior T9 vertebral body extending into the left lamina. Mixed lytic and sclerotic lesion posterior T8 vertebral body with likely extension to the left lamina. Sclerotic lesion posterior T11 vertebral body. Trabecular coarsening of T4 vertebral body may simply represent a hemangioma but is nonspecific. Bilateral breast implants. CT ABDOMEN PELVIS FINDINGS Hepatobiliary: Innumerable defined low-density lesions throughout the liver consistent with metastatic  disease. Conglomerate lesion in the periphery measures approximately 6.8 x 10 cm with slight capsular retraction. Second dominant lesion in the right lobe measures 8.3 x 5.7 cm. There is intrahepatic biliary ductal dilatation. Dilatation of the common bile duct to 16 mm with abrupt transition, image 52 series 4 with associated soft tissue density. Gallbladder partially distended with diffuse gallbladder wall thickening. Small amount perihepatic ascites, measuring simple fluid density. No evidence of hepatic biopsy complication or hemorrhage. Pancreas: Soft tissue fullness in the uncinate process favored to represent adjacent adenopathy rather than focal pancreatic lesion, however difficult to delineate on CT. No ductal dilatation or inflammation. Spleen: Calcified granuloma. No cystic or solid lesion. Spleen is normal in size. Adrenals/Urinary  Tract: Normal adrenal glands without adrenal nodule. No hydronephrosis or perinephric edema. Homogeneous renal enhancement with symmetric excretion on delayed phase imaging. Urinary bladder is physiologically distended without wall thickening. Stomach/Bowel: Lack of enteric contrast limits bowel assessment. No evidence bowel wall thickening, inflammatory change or obstruction. No obvious focal bowel lesion. Moderate stool in the proximal colon. Normal appendix. Vascular/Lymphatic: Aortic atherosclerosis without aneurysm. Bi-iliac atherosclerosis. Portal vein is patent at the porta hepatis, attenuated at the porta splenic confluence, possible occluded with surrounding collaterals. Splenic vein is patent. Heterogeneous appearance of the superior mesenteric vein may be due to contrast mixing, no discrete thrombus. Suspected porta hepatis and portacaval adenopathy, with ill-defined soft tissue density the portal caval region, difficult to delineate from adjacent structures. Small retroperitoneal nodes. No pelvic adenopathy. Reproductive: Status post hysterectomy. No adnexal  masses. Other: Small volume of perihepatic and pelvic ascites.  No free air. Musculoskeletal: Sclerotic lesion within L1 vertebral body extends into the left lamina. Scattered lumbar degenerative change. IMPRESSION: 1. Hepatic metastatic disease with innumerable liver lesions, biopsied earlier today. No evidence of post biopsy hemorrhage or complication. 2. Intrahepatic and proximal extrahepatic biliary ductal dilatation, abrupt transition proximally in the common duct with associated soft tissue density, which may represent adenopathy but is not well delineated. Ill-defined soft tissue density in the porta hepatis and portacaval region is likely adenopathy. This is contiguous with the uncinate process of the pancreas, focal pancreatic lesion difficult to exclude. No pancreatic ductal dilatation. 3. Attenuated portal vein at the portal splenic confluence, possibly occluded with suspected portal collaterals. Portal vein is otherwise patent. 4. Osseous metastatic disease with primarily sclerotic lesions, primarily involving the spine. Lesions involve T8, T9, T10, T11, and L1. Indeterminate lesion at T4 versus hemangioma. T10 lesion involves the entire vertebral body, with heterogeneous appearance of the lamina and possible extraosseous component and mass-effect on the spinal canal. Recommend thoracic spine MRI to evaluate for cord compression. 5. Nonspecific small pulmonary nodules, largest measuring 4 mm in the left upper lobe. 6. Small amount of perihepatic and pelvic ascites. 7. Aortic Atherosclerosis (ICD10-I70.0). Coronary artery calcifications. Electronically Signed   By: Keith Rake M.D.   On: 11/15/2017 22:09   Ct Abdomen Pelvis W Contrast  Result Date: 11/15/2017 CLINICAL DATA:  Liver lesions post ultrasound-guided biopsy today. Jaundice. Diffuse pain. Listed history of breast cancer in electronic medical record. EXAM: CT CHEST, ABDOMEN, AND PELVIS WITH CONTRAST TECHNIQUE: Multidetector CT imaging  of the chest, abdomen and pelvis was performed following the standard protocol during bolus administration of intravenous contrast. CONTRAST:  117mL ISOVUE-300 IOPAMIDOL (ISOVUE-300) INJECTION 61% COMPARISON:  Ultrasound 11/11/2017 FINDINGS: CT CHEST FINDINGS Cardiovascular: Aortic atherosclerosis without aneurysm. The heart is normal in size. There are coronary artery calcifications. No pericardial fluid. Mediastinum/Nodes: No enlarged hilar nodes. 6 mm right epicardial node, no other mediastinal adenopathy. Small lymph nodes in the left axilla largest measuring 7 mm. Surgical clips in the right axilla without right axillary adenopathy. Heterogeneous left lobe of the thyroid gland with multiple small nodules, largest measuring 10 mm. The esophagus is decompressed. Lungs/Pleura: Calcified granuloma in the right upper lobe series 6, image 36. Small noncalcified nodules including punctate right upper lobe nodule image 35, 4 mm left upper lobe nodule image 65, punctate subpleural right middle lobe nodule image 84, and 3 mm left lower lobe nodule image 94. No confluent airspace disease. No pleural fluid. Musculoskeletal: Primarily sclerotic lesion of T10 with diffuse sclerosis of the the vertebral body, and moth-eaten appearance of both lamina,  sclerosis of the transverse and spinous processes. Sclerotic lesions within posterior T9 vertebral body extending into the left lamina. Mixed lytic and sclerotic lesion posterior T8 vertebral body with likely extension to the left lamina. Sclerotic lesion posterior T11 vertebral body. Trabecular coarsening of T4 vertebral body may simply represent a hemangioma but is nonspecific. Bilateral breast implants. CT ABDOMEN PELVIS FINDINGS Hepatobiliary: Innumerable defined low-density lesions throughout the liver consistent with metastatic disease. Conglomerate lesion in the periphery measures approximately 6.8 x 10 cm with slight capsular retraction. Second dominant lesion in the  right lobe measures 8.3 x 5.7 cm. There is intrahepatic biliary ductal dilatation. Dilatation of the common bile duct to 16 mm with abrupt transition, image 52 series 4 with associated soft tissue density. Gallbladder partially distended with diffuse gallbladder wall thickening. Small amount perihepatic ascites, measuring simple fluid density. No evidence of hepatic biopsy complication or hemorrhage. Pancreas: Soft tissue fullness in the uncinate process favored to represent adjacent adenopathy rather than focal pancreatic lesion, however difficult to delineate on CT. No ductal dilatation or inflammation. Spleen: Calcified granuloma. No cystic or solid lesion. Spleen is normal in size. Adrenals/Urinary Tract: Normal adrenal glands without adrenal nodule. No hydronephrosis or perinephric edema. Homogeneous renal enhancement with symmetric excretion on delayed phase imaging. Urinary bladder is physiologically distended without wall thickening. Stomach/Bowel: Lack of enteric contrast limits bowel assessment. No evidence bowel wall thickening, inflammatory change or obstruction. No obvious focal bowel lesion. Moderate stool in the proximal colon. Normal appendix. Vascular/Lymphatic: Aortic atherosclerosis without aneurysm. Bi-iliac atherosclerosis. Portal vein is patent at the porta hepatis, attenuated at the porta splenic confluence, possible occluded with surrounding collaterals. Splenic vein is patent. Heterogeneous appearance of the superior mesenteric vein may be due to contrast mixing, no discrete thrombus. Suspected porta hepatis and portacaval adenopathy, with ill-defined soft tissue density the portal caval region, difficult to delineate from adjacent structures. Small retroperitoneal nodes. No pelvic adenopathy. Reproductive: Status post hysterectomy. No adnexal masses. Other: Small volume of perihepatic and pelvic ascites.  No free air. Musculoskeletal: Sclerotic lesion within L1 vertebral body extends into  the left lamina. Scattered lumbar degenerative change. IMPRESSION: 1. Hepatic metastatic disease with innumerable liver lesions, biopsied earlier today. No evidence of post biopsy hemorrhage or complication. 2. Intrahepatic and proximal extrahepatic biliary ductal dilatation, abrupt transition proximally in the common duct with associated soft tissue density, which may represent adenopathy but is not well delineated. Ill-defined soft tissue density in the porta hepatis and portacaval region is likely adenopathy. This is contiguous with the uncinate process of the pancreas, focal pancreatic lesion difficult to exclude. No pancreatic ductal dilatation. 3. Attenuated portal vein at the portal splenic confluence, possibly occluded with suspected portal collaterals. Portal vein is otherwise patent. 4. Osseous metastatic disease with primarily sclerotic lesions, primarily involving the spine. Lesions involve T8, T9, T10, T11, and L1. Indeterminate lesion at T4 versus hemangioma. T10 lesion involves the entire vertebral body, with heterogeneous appearance of the lamina and possible extraosseous component and mass-effect on the spinal canal. Recommend thoracic spine MRI to evaluate for cord compression. 5. Nonspecific small pulmonary nodules, largest measuring 4 mm in the left upper lobe. 6. Small amount of perihepatic and pelvic ascites. 7. Aortic Atherosclerosis (ICD10-I70.0). Coronary artery calcifications. Electronically Signed   By: Keith Rake M.D.   On: 11/15/2017 22:09   US Biopsy (liver)  Result Date: 11/15/2017 CLINICAL DATA:  Multiple liver lesions by prior ultrasound. The patient presents for biopsy. EXAM: ULTRASOUND GUIDED CORE BIOPSY OF LIVER COMPARISON:  Prior abdominal ultrasound at [facility] on 11/11/2017 MEDICATIONS: 2.0 mg IV Versed; 100 mcg IV Fentanyl Total Moderate Sedation Time: 20 minutes. The patient's level of consciousness and physiologic status were continuously monitored  during the procedure by Radiology nursing. PROCEDURE: The procedure, risks, benefits, and alternatives were explained to the patient. Questions regarding the procedure were encouraged and answered. The patient understands and consents to the procedure. A time out was performed prior to initiating the procedure. Ultrasound was performed of the liver and lesions localized for sampling in the left lobe. The abdominal wall was prepped with chlorhexidine in a sterile fashion, and a sterile drape was applied covering the operative field. A sterile gown and sterile gloves were used for the procedure. Local anesthesia was provided with 1% Lidocaine. Under ultrasound guidance, a 17 gauge trocar needle was advanced into the left lobe of the liver. After confirming needle tip position, coaxial 18 gauge core biopsy samples were obtained of 2 separate adjacent liver lesions. A total of 4 core biopsy samples were obtained and submitted in formalin. After the procedure Gel-Foam pledgets were advanced through the outer needle as it was retracted and removed. COMPLICATIONS: None. FINDINGS: Innumerable small hypoechoic lesions are seen throughout the liver parenchyma. 2 adjacent lesions were sampled in the left lobe of the liver measuring roughly 1.0 and 1.3 cm in greatest transverse diameter respectively. Note is made on initial imaging additional biliary ductal dilatation as well, particularly in the left lobe of the liver. This raises the possibility of central biliary obstruction and eventual correlation is suggested with CT of the abdomen and pelvis with IV contrast. Solid tissue was obtained from the to adjacent lesions within the left lobe of the liver. IMPRESSION: Ultrasound-guided core biopsy performed of 2 adjacent solid hypoechoic lesions within the left lobe of the liver. Innumerable lesions are seen throughout the liver parenchyma as well as evidence of biliary obstruction with dilated intrahepatic ducts, particularly in  the left lobe. Recommend eventual correlation with CT of the chest, abdomen and pelvis for further staging. Electronically Signed   By: Aletta Edouard M.D.   On: 11/15/2017 14:33     Assessment/Plan Principal Problem:   Hepatic failure, acute Active Problems:   Breast cancer, stage 2 ([facility])   HTN (hypertension), benign   Tobacco abuse disorder   Liver lesion     #1 obstructive jaundice: Patient has intrahepatic lesions.  Suspected obstructive jaundice with sudden rising heart total bilirubin.  She will be admitted for possible ERCP with stenting.  Supportive care.  Will order MRCP.  GI has been consulted.  Patient also being followed by oncology.  #2 liver masses: Most likely metastatic disease with her markers being elevated.  Defer to oncology for any further care.  #3 hypertension: Continue home regimen.  Monitor closely.  #4 tobacco abuse: Initiate nicotine patch.  Tobacco cessation counseling.  #5 history of breast cancer: Not sure if liver lesion is breasts cancer recurrence.  Defer decision to oncology.   DVT prophylaxis: Heparin Code Status: Full code Family Communication: Patient's husband and daughter at bedside Disposition Plan: To be determined Consults called: Dr. Cristina Gong, gastroenterology Admission status: Inpatient  Severity of Illness: The appropriate patient status for this patient is INPATIENT. Inpatient status is judged to be reasonable and necessary in order to provide the required intensity of service to ensure the patient's safety. The patient's presenting symptoms, physical exam findings, and initial radiographic and laboratory data in the context of their chronic comorbidities is felt  to place them at high risk for further clinical deterioration. Furthermore, it is not anticipated that the patient will be medically stable for discharge from the hospital within 2 midnights of admission. The following factors support the patient status of inpatient.   " The  patient's presenting symptoms include jaundice with dull abdominal ache. " The worrisome physical exam findings include severe icteric jaundice. " The initial radiographic and laboratory data are worrisome because of elevated liver enzymes with total bilirubin of 11. " The chronic co-morbidities include recent hepatic masses.   * I certify that at the point of admission it is my clinical judgment that the patient will require inpatient hospital care spanning beyond 2 midnights from the point of admission due to high intensity of service, high risk for further deterioration and high frequency of surveillance required.Barbette Merino MD [facility] Pager [phone]  If 7PM-7AM, please contact night-coverage [email or web address] Password [facility]  11/15/2017, 10:46 PM

## 2017-11-15 NOTE — ED Triage Notes (Signed)
Patient states she is jaundice ,having orange urine and states she had a CT scan of her liver last week. Patient  states she hurts all over and does not feel well. Family reports pain and jaundice much worse in the past 24 hours.

## 2017-11-16 ENCOUNTER — Inpatient Hospital Stay (HOSPITAL_COMMUNITY): Payer: BLUE CROSS/BLUE SHIELD

## 2017-11-16 ENCOUNTER — Other Ambulatory Visit: Payer: Self-pay | Admitting: Gastroenterology

## 2017-11-16 DIAGNOSIS — C50919 Malignant neoplasm of unspecified site of unspecified female breast: Secondary | ICD-10-CM

## 2017-11-16 DIAGNOSIS — R1011 Right upper quadrant pain: Secondary | ICD-10-CM

## 2017-11-16 DIAGNOSIS — K831 Obstruction of bile duct: Principal | ICD-10-CM

## 2017-11-16 LAB — CBC
HCT: 31.1 % — ABNORMAL LOW (ref 36.0–46.0)
Hemoglobin: 10.3 g/dL — ABNORMAL LOW (ref 12.0–15.0)
MCH: 31.9 pg (ref 26.0–34.0)
MCHC: 33.1 g/dL (ref 30.0–36.0)
MCV: 96.3 fL (ref 80.0–100.0)
NRBC: 0 % (ref 0.0–0.2)
PLATELETS: 200 10*3/uL (ref 150–400)
RBC: 3.23 MIL/uL — ABNORMAL LOW (ref 3.87–5.11)
RDW: 12.9 % (ref 11.5–15.5)
WBC: 12 10*3/uL — AB (ref 4.0–10.5)

## 2017-11-16 LAB — COMPREHENSIVE METABOLIC PANEL
ALT: 143 U/L — AB (ref 0–44)
AST: 141 U/L — ABNORMAL HIGH (ref 15–41)
Albumin: 2.4 g/dL — ABNORMAL LOW (ref 3.5–5.0)
Alkaline Phosphatase: 1128 U/L — ABNORMAL HIGH (ref 38–126)
Anion gap: 5 (ref 5–15)
BILIRUBIN TOTAL: 10.9 mg/dL — AB (ref 0.3–1.2)
BUN: 13 mg/dL (ref 6–20)
CALCIUM: 8.4 mg/dL — AB (ref 8.9–10.3)
CO2: 23 mmol/L (ref 22–32)
CREATININE: 0.34 mg/dL — AB (ref 0.44–1.00)
Chloride: 104 mmol/L (ref 98–111)
Glucose, Bld: 127 mg/dL — ABNORMAL HIGH (ref 70–99)
Potassium: 3.7 mmol/L (ref 3.5–5.1)
Sodium: 132 mmol/L — ABNORMAL LOW (ref 135–145)
TOTAL PROTEIN: 5.4 g/dL — AB (ref 6.5–8.1)

## 2017-11-16 MED ORDER — LOSARTAN POTASSIUM 50 MG PO TABS
100.0000 mg | ORAL_TABLET | Freq: Once | ORAL | Status: AC
  Administered 2017-11-16: 100 mg via ORAL

## 2017-11-16 MED ORDER — ONDANSETRON HCL 4 MG PO TABS
4.0000 mg | ORAL_TABLET | Freq: Four times a day (QID) | ORAL | Status: DC | PRN
  Administered 2017-11-19: 4 mg via ORAL
  Filled 2017-11-16: qty 1

## 2017-11-16 MED ORDER — HYDROMORPHONE HCL 1 MG/ML IJ SOLN
1.0000 mg | INTRAMUSCULAR | Status: DC | PRN
  Administered 2017-11-16 – 2017-11-18 (×11): 1 mg via INTRAVENOUS
  Filled 2017-11-16 (×11): qty 1

## 2017-11-16 MED ORDER — LORAZEPAM 0.5 MG PO TABS
0.5000 mg | ORAL_TABLET | Freq: Two times a day (BID) | ORAL | Status: DC
  Administered 2017-11-16 – 2017-11-19 (×8): 0.5 mg via ORAL
  Filled 2017-11-16 (×8): qty 1

## 2017-11-16 MED ORDER — LOSARTAN POTASSIUM 50 MG PO TABS
100.0000 mg | ORAL_TABLET | Freq: Every day | ORAL | Status: DC
  Administered 2017-11-16 – 2017-11-17 (×2): 100 mg via ORAL
  Filled 2017-11-16 (×3): qty 2

## 2017-11-16 MED ORDER — INFLUENZA VAC SPLIT QUAD 0.5 ML IM SUSY
0.5000 mL | PREFILLED_SYRINGE | INTRAMUSCULAR | Status: AC
  Administered 2017-11-18: 0.5 mL via INTRAMUSCULAR
  Filled 2017-11-16: qty 0.5

## 2017-11-16 MED ORDER — FLUOXETINE HCL 10 MG PO CAPS
10.0000 mg | ORAL_CAPSULE | Freq: Every day | ORAL | Status: DC
  Administered 2017-11-16 – 2017-11-19 (×4): 10 mg via ORAL
  Filled 2017-11-16 (×4): qty 1

## 2017-11-16 MED ORDER — NICOTINE 21 MG/24HR TD PT24
21.0000 mg | MEDICATED_PATCH | Freq: Every day | TRANSDERMAL | Status: DC
  Filled 2017-11-16 (×2): qty 1

## 2017-11-16 MED ORDER — ONDANSETRON HCL 4 MG/2ML IJ SOLN
4.0000 mg | Freq: Four times a day (QID) | INTRAMUSCULAR | Status: DC | PRN
  Administered 2017-11-16 – 2017-11-18 (×2): 4 mg via INTRAVENOUS
  Filled 2017-11-16 (×2): qty 2

## 2017-11-16 MED ORDER — CALCIUM CARBONATE-VITAMIN D 500-200 MG-UNIT PO TABS
2.0000 | ORAL_TABLET | Freq: Every day | ORAL | Status: DC
  Administered 2017-11-16 – 2017-11-19 (×3): 2 via ORAL
  Filled 2017-11-16 (×3): qty 2

## 2017-11-16 MED ORDER — DEXTROSE-NACL 5-0.9 % IV SOLN
INTRAVENOUS | Status: DC
  Administered 2017-11-16 – 2017-11-18 (×6): via INTRAVENOUS

## 2017-11-16 MED ORDER — GADOBUTROL 1 MMOL/ML IV SOLN
6.0000 mL | Freq: Once | INTRAVENOUS | Status: AC | PRN
  Administered 2017-11-16: 6 mL via INTRAVENOUS

## 2017-11-16 MED ORDER — BISACODYL 5 MG PO TBEC
10.0000 mg | DELAYED_RELEASE_TABLET | Freq: Once | ORAL | Status: AC
  Administered 2017-11-16: 10 mg via ORAL
  Filled 2017-11-16: qty 2

## 2017-11-16 MED ORDER — VITAMIN D3 25 MCG (1000 UNIT) PO TABS
2000.0000 [IU] | ORAL_TABLET | Freq: Every day | ORAL | Status: DC
  Administered 2017-11-16 – 2017-11-19 (×3): 2000 [IU] via ORAL
  Filled 2017-11-16 (×3): qty 2

## 2017-11-16 NOTE — Consult Note (Signed)
[facility] Gastroenterology Consultation Note  Referring Provider: Dr. Irine Seal [facility]) Primary Care Physician:  Chesley Noon, MD Primary Gastroenterologist:  Dr. Clarene Essex  Reason for Consultation:  Abnormal imaging, elevated liver enzymes  HPI: Caitlin Pollard is a 57 y.o. female with several weeks of progressive back pain, abdominal pain, generalized malaise, weight loss.  Developed jaundice over the past one week.  No fevers or blood in stool.  Has had some intermittent solid-predominant dysphagia as well.  Has lost 15-20 lbs.  CT, MRCP results as below.  Significant elevation of LFTs, in mixed pattern.   Past Medical History:  Diagnosis Date  . Breast cancer ([place])   . Breast cancer, stage 2 ([place]) 04/05/2011  . HTN (hypertension), benign 04/05/2011  . Hyperglycemia without ketosis 04/05/2011  . IBD (inflammatory bowel disease) 04/05/2011    Past Surgical History:  Procedure Laterality Date  . ABDOMINAL HYSTERECTOMY    . AUGMENTATION MAMMAPLASTY Bilateral   . KNEE SURGERY    . MASTECTOMY Right   . WRIST SURGERY      Prior to Admission medications   Medication Sig Start Date End Date Taking? Authorizing Provider  Calcium Carb-Cholecalciferol (CALCIUM 1000 + D PO) Take 1 tablet by mouth daily.   Yes [provider]  cholecalciferol (VITAMIN D) 1000 UNITS tablet Take 2,000 Units by mouth daily.   Yes [provider]  cyclobenzaprine (FLEXERIL) 10 MG tablet Take by mouth. 02/25/17  Yes [provider]  FLUoxetine (PROZAC) 10 MG capsule Take 10 mg by mouth daily.  07/14/17 07/15/18 Yes [provider]  ibuprofen (ADVIL,MOTRIN) 200 MG tablet Take 200 mg by mouth every 6 (six) hours as needed.   Yes [provider]  LORazepam (ATIVAN) 0.5 MG tablet Take 1 tablet (0.5 mg total) by mouth 2 (two) times daily. 11/12/17  Yes Magrinat, Virgie Dad, MD  losartan (COZAAR) 100 MG tablet Take 100 mg by mouth daily.  07/14/17  Yes [provider]     Current Facility-Administered Medications  Medication Dose Route Frequency Provider Last Rate Last Dose  . calcium-vitamin D (OSCAL WITH D) 500-200 MG-UNIT per tablet 2 tablet  2 tablet Oral Daily Elwyn Reach, MD   2 tablet at 11/16/17 0936  . cholecalciferol (VITAMIN D) tablet 2,000 Units  2,000 Units Oral Daily Elwyn Reach, MD   2,000 Units at 11/16/17 0936  . dextrose 5 %-0.9 % sodium chloride infusion   Intravenous Continuous Elwyn Reach, MD 125 mL/hr at 11/16/17 0937    . FLUoxetine (PROZAC) capsule 10 mg  10 mg Oral Daily Elwyn Reach, MD   10 mg at 11/16/17 0936  . HYDROmorphone (DILAUDID) injection 1 mg  1 mg Intravenous Q3H PRN Elwyn Reach, MD   1 mg at 11/16/17 0454  . LORazepam (ATIVAN) injection 1 mg  1 mg Intravenous Q6H PRN Charlesetta Shanks, MD   1 mg at 11/15/17 2329  . LORazepam (ATIVAN) tablet 0.5 mg  0.5 mg Oral BID Gala Romney L, MD   0.5 mg at 11/16/17 0752  . losartan (COZAAR) tablet 100 mg  100 mg Oral Daily Gala Romney L, MD   100 mg at 11/16/17 0936  . nicotine (NICODERM CQ - dosed in mg/24 hours) patch 21 mg  21 mg Transdermal Daily Garba, Mohammad L, MD      . ondansetron (ZOFRAN) tablet 4 mg  4 mg Oral Q6H PRN Elwyn Reach, MD       Or  .  ondansetron (ZOFRAN) injection 4 mg  4 mg Intravenous Q6H PRN Elwyn Reach, MD   4 mg at 11/16/17 0031    Allergies as of 11/15/2017 - Review Complete 11/15/2017  Allergen Reaction Noted  . Cholestatin Other (See Comments) 04/03/2011    Family History  Problem Relation Age of Onset  . Cancer Father     Social History   Socioeconomic History  . Marital status: Married    Spouse name: Not on file  . Number of children: Not on file  . Years of education: Not on file  . Highest education level: Not on file  Occupational History  . Not on file  Social Needs  . Financial resource strain: Not on file  . Food insecurity:    Worry: Not on file    Inability: Not on file  .  Transportation needs:    Medical: Not on file    Non-medical: Not on file  Tobacco Use  . Smoking status: Never Smoker  . Smokeless tobacco: Never Used  Substance and Sexual Activity  . Alcohol use: Yes  . Drug use: Never  . Sexual activity: Not on file  Lifestyle  . Physical activity:    Days per week: Not on file    Minutes per session: Not on file  . Stress: Not on file  Relationships  . Social connections:    Talks on phone: Not on file    Gets together: Not on file    Attends religious service: Not on file    Active member of club or organization: Not on file    Attends meetings of clubs or organizations: Not on file    Relationship status: Not on file  . Intimate partner violence:    Fear of current or ex partner: Not on file    Emotionally abused: Not on file    Physically abused: Not on file    Forced sexual activity: Not on file  Other Topics Concern  . Not on file  Social History Narrative  . Not on file    Review of Systems: As per HPI, all others negative  Physical Exam: Vital signs in last 24 hours: Temp:  [97.9 F (36.6 C)-98.7 F (37.1 C)] 98.1 F (36.7 C) (10/22 0003) Pulse Rate:  [79-97] 97 (10/22 0003) Resp:  [15-20] 20 (10/22 0003) BP: (124-160)/(86-110) 154/96 (10/22 0003) SpO2:  [95 %-100 %] 97 % (10/22 0003) Weight:  [63 kg-63.1 kg] 63 kg (10/22 0005) Last BM Date: 11/15/17 General:   Alert,  Anxious and tearful, jaundiced, NAD Head:  Normocephalic and atraumatic. Eyes:  Sclera icteric bilaterally,  Conjunctiva pink. Ears:  Normal auditory acuity. Nose:  No deformity, discharge,  or lesions. Mouth:  No deformity or lesions.  Oropharynx pink & moist. Neck:  Supple; no masses or thyromegaly.   Msk:  Symmetrical without gross deformities. Normal posture. Pulses:  Normal pulses noted. Neurologic:  Alert and  oriented x4; diffusely weak, grossly normal neurologically. Skin:  Jaundiced, Intact without significant lesions or rashes. Psych:   Depressed mood, flat affect   Lab Results: Recent Labs    11/15/17 1113 11/15/17 1706 11/16/17 0518  WBC 15.2* 13.4* 12.0*  HGB 12.8 11.7* 10.3*  HCT 38.0 34.6* 31.1*  PLT 263 258 200   BMET Recent Labs    11/15/17 1706 11/16/17 0518  NA 130* 132*  K 3.9 3.7  CL 98 104  CO2 25 23  GLUCOSE 119* 127*  BUN 15 13  CREATININE 0.61  0.34*  CALCIUM 8.7* 8.4*   LFT Recent Labs    11/16/17 0518  PROT 5.4*  ALBUMIN 2.4*  AST 141*  ALT 143*  ALKPHOS 1,128*  BILITOT 10.9*   PT/INR Recent Labs    11/15/17 1113 11/15/17 1706  LABPROT 12.1 12.5  INR 0.91 0.95    Studies/Results: Ct Chest W Contrast  Result Date: 11/15/2017 CLINICAL DATA:  Liver lesions post ultrasound-guided biopsy today. Jaundice. Diffuse pain. Listed history of breast cancer in electronic medical record. EXAM: CT CHEST, ABDOMEN, AND PELVIS WITH CONTRAST TECHNIQUE: Multidetector CT imaging of the chest, abdomen and pelvis was performed following the standard protocol during bolus administration of intravenous contrast. CONTRAST:  167mL ISOVUE-300 IOPAMIDOL (ISOVUE-300) INJECTION 61% COMPARISON:  Ultrasound 11/11/2017 FINDINGS: CT CHEST FINDINGS Cardiovascular: Aortic atherosclerosis without aneurysm. The heart is normal in size. There are coronary artery calcifications. No pericardial fluid. Mediastinum/Nodes: No enlarged hilar nodes. 6 mm right epicardial node, no other mediastinal adenopathy. Small lymph nodes in the left axilla largest measuring 7 mm. Surgical clips in the right axilla without right axillary adenopathy. Heterogeneous left lobe of the thyroid gland with multiple small nodules, largest measuring 10 mm. The esophagus is decompressed. Lungs/Pleura: Calcified granuloma in the right upper lobe series 6, image 36. Small noncalcified nodules including punctate right upper lobe nodule image 35, 4 mm left upper lobe nodule image 65, punctate subpleural right middle lobe nodule image 84, and 3 mm left  lower lobe nodule image 94. No confluent airspace disease. No pleural fluid. Musculoskeletal: Primarily sclerotic lesion of T10 with diffuse sclerosis of the the vertebral body, and moth-eaten appearance of both lamina, sclerosis of the transverse and spinous processes. Sclerotic lesions within posterior T9 vertebral body extending into the left lamina. Mixed lytic and sclerotic lesion posterior T8 vertebral body with likely extension to the left lamina. Sclerotic lesion posterior T11 vertebral body. Trabecular coarsening of T4 vertebral body may simply represent a hemangioma but is nonspecific. Bilateral breast implants. CT ABDOMEN PELVIS FINDINGS Hepatobiliary: Innumerable defined low-density lesions throughout the liver consistent with metastatic disease. Conglomerate lesion in the periphery measures approximately 6.8 x 10 cm with slight capsular retraction. Second dominant lesion in the right lobe measures 8.3 x 5.7 cm. There is intrahepatic biliary ductal dilatation. Dilatation of the common bile duct to 16 mm with abrupt transition, image 52 series 4 with associated soft tissue density. Gallbladder partially distended with diffuse gallbladder wall thickening. Small amount perihepatic ascites, measuring simple fluid density. No evidence of hepatic biopsy complication or hemorrhage. Pancreas: Soft tissue fullness in the uncinate process favored to represent adjacent adenopathy rather than focal pancreatic lesion, however difficult to delineate on CT. No ductal dilatation or inflammation. Spleen: Calcified granuloma. No cystic or solid lesion. Spleen is normal in size. Adrenals/Urinary Tract: Normal adrenal glands without adrenal nodule. No hydronephrosis or perinephric edema. Homogeneous renal enhancement with symmetric excretion on delayed phase imaging. Urinary bladder is physiologically distended without wall thickening. Stomach/Bowel: Lack of enteric contrast limits bowel assessment. No evidence bowel wall  thickening, inflammatory change or obstruction. No obvious focal bowel lesion. Moderate stool in the proximal colon. Normal appendix. Vascular/Lymphatic: Aortic atherosclerosis without aneurysm. Bi-iliac atherosclerosis. Portal vein is patent at the porta hepatis, attenuated at the porta splenic confluence, possible occluded with surrounding collaterals. Splenic vein is patent. Heterogeneous appearance of the superior mesenteric vein may be due to contrast mixing, no discrete thrombus. Suspected porta hepatis and portacaval adenopathy, with ill-defined soft tissue density the portal caval region, difficult to  delineate from adjacent structures. Small retroperitoneal nodes. No pelvic adenopathy. Reproductive: Status post hysterectomy. No adnexal masses. Other: Small volume of perihepatic and pelvic ascites.  No free air. Musculoskeletal: Sclerotic lesion within L1 vertebral body extends into the left lamina. Scattered lumbar degenerative change. IMPRESSION: 1. Hepatic metastatic disease with innumerable liver lesions, biopsied earlier today. No evidence of post biopsy hemorrhage or complication. 2. Intrahepatic and proximal extrahepatic biliary ductal dilatation, abrupt transition proximally in the common duct with associated soft tissue density, which may represent adenopathy but is not well delineated. Ill-defined soft tissue density in the porta hepatis and portacaval region is likely adenopathy. This is contiguous with the uncinate process of the pancreas, focal pancreatic lesion difficult to exclude. No pancreatic ductal dilatation. 3. Attenuated portal vein at the portal splenic confluence, possibly occluded with suspected portal collaterals. Portal vein is otherwise patent. 4. Osseous metastatic disease with primarily sclerotic lesions, primarily involving the spine. Lesions involve T8, T9, T10, T11, and L1. Indeterminate lesion at T4 versus hemangioma. T10 lesion involves the entire vertebral body, with  heterogeneous appearance of the lamina and possible extraosseous component and mass-effect on the spinal canal. Recommend thoracic spine MRI to evaluate for cord compression. 5. Nonspecific small pulmonary nodules, largest measuring 4 mm in the left upper lobe. 6. Small amount of perihepatic and pelvic ascites. 7. Aortic Atherosclerosis (ICD10-I70.0). Coronary artery calcifications. Electronically Signed   By: Keith Rake M.D.   On: 11/15/2017 22:09   Ct Abdomen Pelvis W Contrast  Result Date: 11/15/2017 CLINICAL DATA:  Liver lesions post ultrasound-guided biopsy today. Jaundice. Diffuse pain. Listed history of breast cancer in electronic medical record. EXAM: CT CHEST, ABDOMEN, AND PELVIS WITH CONTRAST TECHNIQUE: Multidetector CT imaging of the chest, abdomen and pelvis was performed following the standard protocol during bolus administration of intravenous contrast. CONTRAST:  15mL ISOVUE-300 IOPAMIDOL (ISOVUE-300) INJECTION 61% COMPARISON:  Ultrasound 11/11/2017 FINDINGS: CT CHEST FINDINGS Cardiovascular: Aortic atherosclerosis without aneurysm. The heart is normal in size. There are coronary artery calcifications. No pericardial fluid. Mediastinum/Nodes: No enlarged hilar nodes. 6 mm right epicardial node, no other mediastinal adenopathy. Small lymph nodes in the left axilla largest measuring 7 mm. Surgical clips in the right axilla without right axillary adenopathy. Heterogeneous left lobe of the thyroid gland with multiple small nodules, largest measuring 10 mm. The esophagus is decompressed. Lungs/Pleura: Calcified granuloma in the right upper lobe series 6, image 36. Small noncalcified nodules including punctate right upper lobe nodule image 35, 4 mm left upper lobe nodule image 65, punctate subpleural right middle lobe nodule image 84, and 3 mm left lower lobe nodule image 94. No confluent airspace disease. No pleural fluid. Musculoskeletal: Primarily sclerotic lesion of T10 with diffuse  sclerosis of the the vertebral body, and moth-eaten appearance of both lamina, sclerosis of the transverse and spinous processes. Sclerotic lesions within posterior T9 vertebral body extending into the left lamina. Mixed lytic and sclerotic lesion posterior T8 vertebral body with likely extension to the left lamina. Sclerotic lesion posterior T11 vertebral body. Trabecular coarsening of T4 vertebral body may simply represent a hemangioma but is nonspecific. Bilateral breast implants. CT ABDOMEN PELVIS FINDINGS Hepatobiliary: Innumerable defined low-density lesions throughout the liver consistent with metastatic disease. Conglomerate lesion in the periphery measures approximately 6.8 x 10 cm with slight capsular retraction. Second dominant lesion in the right lobe measures 8.3 x 5.7 cm. There is intrahepatic biliary ductal dilatation. Dilatation of the common bile duct to 16 mm with abrupt transition, image 52 series 4  with associated soft tissue density. Gallbladder partially distended with diffuse gallbladder wall thickening. Small amount perihepatic ascites, measuring simple fluid density. No evidence of hepatic biopsy complication or hemorrhage. Pancreas: Soft tissue fullness in the uncinate process favored to represent adjacent adenopathy rather than focal pancreatic lesion, however difficult to delineate on CT. No ductal dilatation or inflammation. Spleen: Calcified granuloma. No cystic or solid lesion. Spleen is normal in size. Adrenals/Urinary Tract: Normal adrenal glands without adrenal nodule. No hydronephrosis or perinephric edema. Homogeneous renal enhancement with symmetric excretion on delayed phase imaging. Urinary bladder is physiologically distended without wall thickening. Stomach/Bowel: Lack of enteric contrast limits bowel assessment. No evidence bowel wall thickening, inflammatory change or obstruction. No obvious focal bowel lesion. Moderate stool in the proximal colon. Normal appendix.  Vascular/Lymphatic: Aortic atherosclerosis without aneurysm. Bi-iliac atherosclerosis. Portal vein is patent at the porta hepatis, attenuated at the porta splenic confluence, possible occluded with surrounding collaterals. Splenic vein is patent. Heterogeneous appearance of the superior mesenteric vein may be due to contrast mixing, no discrete thrombus. Suspected porta hepatis and portacaval adenopathy, with ill-defined soft tissue density the portal caval region, difficult to delineate from adjacent structures. Small retroperitoneal nodes. No pelvic adenopathy. Reproductive: Status post hysterectomy. No adnexal masses. Other: Small volume of perihepatic and pelvic ascites.  No free air. Musculoskeletal: Sclerotic lesion within L1 vertebral body extends into the left lamina. Scattered lumbar degenerative change. IMPRESSION: 1. Hepatic metastatic disease with innumerable liver lesions, biopsied earlier today. No evidence of post biopsy hemorrhage or complication. 2. Intrahepatic and proximal extrahepatic biliary ductal dilatation, abrupt transition proximally in the common duct with associated soft tissue density, which may represent adenopathy but is not well delineated. Ill-defined soft tissue density in the porta hepatis and portacaval region is likely adenopathy. This is contiguous with the uncinate process of the pancreas, focal pancreatic lesion difficult to exclude. No pancreatic ductal dilatation. 3. Attenuated portal vein at the portal splenic confluence, possibly occluded with suspected portal collaterals. Portal vein is otherwise patent. 4. Osseous metastatic disease with primarily sclerotic lesions, primarily involving the spine. Lesions involve T8, T9, T10, T11, and L1. Indeterminate lesion at T4 versus hemangioma. T10 lesion involves the entire vertebral body, with heterogeneous appearance of the lamina and possible extraosseous component and mass-effect on the spinal canal. Recommend thoracic spine  MRI to evaluate for cord compression. 5. Nonspecific small pulmonary nodules, largest measuring 4 mm in the left upper lobe. 6. Small amount of perihepatic and pelvic ascites. 7. Aortic Atherosclerosis (ICD10-I70.0). Coronary artery calcifications. Electronically Signed   By: Keith Rake M.D.   On: 11/15/2017 22:09   Mr 3d Recon At Scanner  Addendum Date: 11/16/2017   ADDENDUM REPORT: 11/16/2017 11:55 ADDENDUM: In addition, there are several bone lesions in the visualized lower thoracic and upper lumbar spine, consistent with bone metastases. Electronically Signed   By: Earle Gell M.D.   On: 11/16/2017 11:55   Result Date: 11/16/2017 CLINICAL DATA:  Jaundice. Abdominal pain. Personal history of breast carcinoma. EXAM: MRI ABDOMEN WITHOUT AND WITH CONTRAST (INCLUDING MRCP) TECHNIQUE: Multiplanar multisequence MR imaging of the abdomen was performed both before and after the administration of intravenous contrast. Heavily T2-weighted images of the biliary and pancreatic ducts were obtained, and three-dimensional MRCP images were rendered by post processing. CONTRAST:  6 mL Gadavist COMPARISON:  CT on 11/15/2017 FINDINGS: Lower chest: No acute findings. Hepatobiliary: Numerous hypovascular masses are seen throughout the liver, consistent with diffuse liver metastases. Largest index lesion is located in the posterior  right hepatic lobe measuring 8.6 x 5.3 cm. Gallbladder is incompletely distended. Mild wall thickening is seen without pericholecystic inflammatory changes. This is nonspecific and likely due to hepatocellular disease. Marked diffuse biliary ductal dilatation is seen. 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 This mass is centered in the porta hepatis and abuts the superior margin of the pancreatic head. Differential diagnosis includes pancreatic carcinoma, cholangiocarcinoma, and  metastatic porta hepatis lymphadenopathy. Pancreas: 2.1 cm soft tissue mass in the porta hepatis abuts the superior margin of the pancreatic head. Pancreas is otherwise unremarkable in appearance and there is no evidence of pancreatic ductal dilatation. Spleen:  Within normal limits in size and appearance. Adrenals/Urinary Tract: No masses identified. No evidence of hydronephrosis. Stomach/Bowel: Visualized portion unremarkable. Vascular/Lymphatic: Soft-tissue mass in porta hepatis which is contiguous with the pancreatic head may represent lymphadenopathy versus pancreatic or common bile duct neoplasm. No other sites of lymphadenopathy identified. No abdominal aortic aneurysm. Other:  None. Musculoskeletal:  No suspicious bone lesions identified. IMPRESSION: Diffuse liver metastases. Diffuse biliary ductal dilatation due to 2.1 cm soft tissue mass in the porta hepatis and involving the superior margin of the pancreatic head. Differential diagnosis includes primary pancreatic carcinoma, cholangiocarcinoma, and metastatic lymphadenopathy. Consider ERCP for further evaluation. Electronically Signed: By: Earle Gell M.D. On: 11/16/2017 09:49   US Biopsy (liver)  Result Date: 11/15/2017 CLINICAL DATA:  Multiple liver lesions by prior ultrasound. The patient presents for biopsy. EXAM: ULTRASOUND GUIDED CORE BIOPSY OF LIVER COMPARISON:  Prior abdominal ultrasound at [facility] on 11/11/2017 MEDICATIONS: 2.0 mg IV Versed; 100 mcg IV Fentanyl Total Moderate Sedation Time: 20 minutes. The patient's level of consciousness and physiologic status were continuously monitored during the procedure by Radiology nursing. PROCEDURE: The procedure, risks, benefits, and alternatives were explained to the patient. Questions regarding the procedure were encouraged and answered. The patient understands and consents to the procedure. A time out was performed prior to initiating the procedure. Ultrasound was performed of the  liver and lesions localized for sampling in the left lobe. The abdominal wall was prepped with chlorhexidine in a sterile fashion, and a sterile drape was applied covering the operative field. A sterile gown and sterile gloves were used for the procedure. Local anesthesia was provided with 1% Lidocaine. Under ultrasound guidance, a 17 gauge trocar needle was advanced into the left lobe of the liver. After confirming needle tip position, coaxial 18 gauge core biopsy samples were obtained of 2 separate adjacent liver lesions. A total of 4 core biopsy samples were obtained and submitted in formalin. After the procedure Gel-Foam pledgets were advanced through the outer needle as it was retracted and removed. COMPLICATIONS: None. FINDINGS: Innumerable small hypoechoic lesions are seen throughout the liver parenchyma. 2 adjacent lesions were sampled in the left lobe of the liver measuring roughly 1.0 and 1.3 cm in greatest transverse diameter respectively. Note is made on initial imaging additional biliary ductal dilatation as well, particularly in the left lobe of the liver. This raises the possibility of central biliary obstruction and eventual correlation is suggested with CT of the abdomen and pelvis with IV contrast. Solid tissue was obtained from the to adjacent lesions within the left lobe of the liver. IMPRESSION: Ultrasound-guided core biopsy performed of 2 adjacent solid hypoechoic lesions within the left lobe of the liver. Innumerable lesions are seen throughout the liver parenchyma as well  as evidence of biliary obstruction with dilated intrahepatic ducts, particularly in the left lobe. Recommend eventual correlation with CT of the chest, abdomen and pelvis for further staging. Electronically Signed   By: Aletta Edouard M.D.   On: 11/15/2017 14:33   Mr Abdomen Mrcp Moise Boring Contast  Addendum Date: 11/16/2017   ADDENDUM REPORT: 11/16/2017 11:55 ADDENDUM: In addition, there are several bone lesions in the  visualized lower thoracic and upper lumbar spine, consistent with bone metastases. Electronically Signed   By: Earle Gell M.D.   On: 11/16/2017 11:55   Result Date: 11/16/2017 CLINICAL DATA:  Jaundice. Abdominal pain. Personal history of breast carcinoma. EXAM: MRI ABDOMEN WITHOUT AND WITH CONTRAST (INCLUDING MRCP) TECHNIQUE: Multiplanar multisequence MR imaging of the abdomen was performed both before and after the administration of intravenous contrast. Heavily T2-weighted images of the biliary and pancreatic ducts were obtained, and three-dimensional MRCP images were rendered by post processing. CONTRAST:  6 mL Gadavist COMPARISON:  CT on 11/15/2017 FINDINGS: Lower chest: No acute findings. Hepatobiliary: Numerous hypovascular masses are seen throughout the liver, consistent with diffuse liver metastases. Largest index lesion is located in the posterior right hepatic lobe measuring 8.6 x 5.3 cm. Gallbladder is incompletely distended. Mild wall thickening is seen without pericholecystic inflammatory changes. This is nonspecific and likely due to hepatocellular disease. Marked diffuse biliary ductal dilatation is seen. 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 This mass is centered in the porta hepatis and abuts the superior margin of the pancreatic head. Differential diagnosis includes pancreatic carcinoma, cholangiocarcinoma, and metastatic porta hepatis lymphadenopathy. Pancreas: 2.1 cm soft tissue mass in the porta hepatis abuts the superior margin of the pancreatic head. Pancreas is otherwise unremarkable in appearance and there is no evidence of pancreatic ductal dilatation. Spleen:  Within normal limits in size and appearance. Adrenals/Urinary Tract: No masses identified. No evidence of hydronephrosis. Stomach/Bowel: Visualized portion unremarkable. Vascular/Lymphatic: Soft-tissue mass in  porta hepatis which is contiguous with the pancreatic head may represent lymphadenopathy versus pancreatic or common bile duct neoplasm. No other sites of lymphadenopathy identified. No abdominal aortic aneurysm. Other:  None. Musculoskeletal:  No suspicious bone lesions identified. IMPRESSION: Diffuse liver metastases. Diffuse biliary ductal dilatation due to 2.1 cm soft tissue mass in the porta hepatis and involving the superior margin of the pancreatic head. Differential diagnosis includes primary pancreatic carcinoma, cholangiocarcinoma, and metastatic lymphadenopathy. Consider ERCP for further evaluation. Electronically Signed: By: Earle Gell M.D. On: 11/16/2017 09:49    Impression:  1.  New onset jaundice.  Imaging most consistent with metastatic disease to liver of hepatobiliary (GB, bile duct) or pancreatic etiology.  Jaundice likely combination both of intrinsic tumor burden as well as bile duct obstruction. 2.  Abnormal imaging study. As above. 3.  Elevated liver enzymes. 4.  Abdominal pain, back pain, weight loss. 5.  Dysphagia.  Plan:  1.  I reviewed the images (CT, MRCP) with patient, family, as well as Dr. Watt Climes.  We feel there are a few discrete strictures in both the extrahepatic bile duct as well as in the intrahepatic tree in region of biliary bifurcation. 2.  Endoscopy forward viewing scope first, +/- esophageal dilatation.  Then will attempt ERCP for stenting tomorrow 1130 am at [facility] ([facility]).  Risks, benefits, and rationale of ERCP reviewed with patient and family in detail. 3.  Risks (up to and including bleeding,  infection, perforation, pancreatitis that can be complicated by infected necrosis and death), benefits (removal of stones, alleviating blockage, decreasing risk of cholangitis or choledocholithiasis-related pancreatitis), and alternatives (watchful waiting, percutaneous transhepatic cholangiography) of ERCP were explained to patient/family in detail and  patient elects to proceed. 4.  Awaiting liver biopsy results (done yesterday). 5.  [facility] will follow.   LOS: 1 day   Gearline Spilman M  11/16/2017, 12:27 PM  Cell [phone] If no answer or after 5 PM call [phone]

## 2017-11-16 NOTE — Progress Notes (Signed)
Spoke with Morey Hummingbird in Endoscopy at [facility] who confirmed [facility] trip has been set up for tomorrow for this patient.

## 2017-11-16 NOTE — Progress Notes (Signed)
PROGRESS NOTE    Caitlin Pollard  BEE:[id number] DOB: Apr 28, 1960 DOA: 11/15/2017 PCP: Chesley Noon, MD    Brief Narrative:  Caitlin Pollard is a 57 y.o. female with medical history significant of breast cancer diagnosed in 1999 and treated.  Patient took tamoxifen for 10 years thereafter and stopped after she was cleared.  She also try letrozole for 1 year after that.  Patient has had follow-up including previous pancreatic lesion in 2006 with biopsy done at [facility] which was found to be benign.  She has hypertension as well as inflammatory bowel disease.  Patient has been doing fine until about a month ago when she started getting some cough thoracic and back pain.  She went to see her primary care physician and at the time was thought to have bronchitis.  She later has right upper quadrant abdominal pain that was evaluated with a scan coming back showing multiple hepatic lesions suspected to be metastatic disease.  She was seen by oncology and had biopsy done today for the liver.  Patient came to the ER afterwards because of worsening symptoms including jaundice and weakness.  Patient is in tears at the moment with family at bedside.  She is having some dull ache in the area and very anxious.  She has severe jaundice with elevated LFTs including total bilirubin of 11.  Gastroenterology has been consulted and patient is being placed in the hospital for further work-up..  ED Course: Temperature is 98.7 blood pressure 160/110 pulse 99 respiratory of 20 oxygen sats 95% room air.  White count is 13.4 hemoglobin 11.7 and platelet 258.  Sodium 130 potassium 3.9 chloride 98 with CO2 25 and creatinine 0.61 glucose 119.  Ammonia level is 65 alkaline phosphatase 1233 albumin 3.1 AST 152 ALT 167 total protein, total bilirubin is 11, it was only 3 4 days ago.  66 lactic acid 1.05.  Patient had recent CA-19-9 of 15,854 CA 27.29 is 149.7 and CEA is 10.64.  PT/INR is within normal.  She is being admitted for  further work-up of obstructive jaundice   Assessment & Plan:   Principal Problem:   Hepatic failure, acute Active Problems:   Breast cancer, stage 2 (HCC)   HTN (hypertension), benign   Tobacco abuse disorder   Liver lesion  1 obstructive jaundice/concern for pancreatic mass  Patient presented with obstructive jaundice with sudden increase in total bilirubin.  Patient with elevated LFTs and alk phosphatase.  LFTs seem to be slowly trending down.  CT abdomen and pelvis with hepatic metastatic disease with innumerable liver lesions status post biopsy.  Intrahepatic and proximal extrahepatic biliary ductal dilatation noted with concern for ill-defined soft tissue density in the porta hepatis and portacaval region likely adenopathy.  Contagious with uncinate process of the pancreas a focal pancreatic lesion difficult to exclude.  Osseous metastatic disease with primary sclerotic lesions noted involving the spine.  CT chest with small pulmonary nodules largest measuring 4 mm in the left upper lobe.  MRCP done with diffuse liver metastases, diffuse biliary ductal dilatation due to 2.1 cm soft tissue mass in the porta hepatis and involving superior margin of the pancreatic head.  GI consulted and patient currently being seen by Dr. Paulita Fujita for further evaluation and management and probable endoscopy and ERCP with further evaluation.  Oncology following.  2.  Hepatic metastases Noted on CT abdomen and pelvis as well as MRCP.  Concern for pancreatic mass.  Patient also noted to have an elevated CA-19-9  which was obtained prior to admission.  Patient status post liver biopsy results pending.  Per oncology.  3.  Hypertension Continue Cozaar.  Follow.  4.  Tobacco abuse Tobacco cessation.  Continue nicotine patch.  5.  Abdominal pain Secondary to problem #1 and 2.  Pain management.  GI evaluation pending.  Oncology following.  6 history of breast cancer Per oncology.   DVT prophylaxis: SCDs Code  Status: Full Family Communication: Updated patient and daughters at bedside. Disposition Plan: Pending work-up.   Consultants:   Gastroenterology: Dr. Paulita Fujita 11/16/2017  Oncology: Dr. Jana Hakim  Procedures:  MRCP 11/16/2017  CT abdomen and pelvis 11/15/2017  CT chest 11/15/2017  Antimicrobials:   None   Subjective: Patient in bed.  Patient tearful.  Denies any shortness of breath or chest pain.  Complains of abdominal pain.  Objective: Vitals:   11/15/17 1837 11/15/17 2230 11/16/17 0003 11/16/17 0005  BP: (!) 146/100 (!) 160/110 (!) 154/96   Pulse: 87 90 97   Resp: 18 19 20    Temp: 98.4 F (36.9 C)  98.1 F (36.7 C)   TempSrc: Oral  Oral   SpO2: 99% 97% 97%   Weight:    63 kg  Height:    5' 7"  (1.702 m)    Intake/Output Summary (Last 24 hours) at 11/16/2017 1247 Last data filed at 11/16/2017 0700 Gross per 24 hour  Intake 841.73 ml  Output -  Net 841.73 ml   Filed Weights   11/15/17 1547 11/16/17 0005  Weight: 63.1 kg 63 kg    Examination:  General exam: Jaundice.  Respiratory system: Clear to auscultation. Respiratory effort normal. Cardiovascular system: S1 & S2 heard, RRR. No JVD, murmurs, rubs, gallops or clicks. No pedal edema. Gastrointestinal system: Abdomen is nondistended, soft and nontender.  Hepatomegaly.  Some tenderness to palpation right upper quadrant and epigastrium.  Positive bowel sounds.  Central nervous system: Alert and oriented. No focal neurological deficits. Extremities: Symmetric 5 x 5 power. Skin: No rashes, lesions or ulcers Psychiatry: Judgement and insight appear normal. Mood & affect appropriate.     Data Reviewed: I have personally reviewed following labs and imaging studies  CBC: Recent Labs  Lab 11/11/17 1543 11/15/17 1113 11/15/17 1706 11/16/17 0518  WBC 17.6* 15.2* 13.4* 12.0*  NEUTROABS 14.8*  --  10.8*  --   HGB 12.8 12.8 11.7* 10.3*  HCT 37.8 38.0 34.6* 31.1*  MCV 96.4 95.2 94.5 96.3  PLT 305 263 258  536   Basic Metabolic Panel: Recent Labs  Lab 11/11/17 1543 11/15/17 1706 11/16/17 0518  NA 133* 130* 132*  K 4.5 3.9 3.7  CL 92* 98 104  CO2 28 25 23   GLUCOSE 109* 119* 127*  BUN 12 15 13   CREATININE 0.72 0.61 0.34*  CALCIUM 10.1 8.7* 8.4*   GFR: Estimated Creatinine Clearance: 74.5 mL/min (A) (by C-G formula based on SCr of 0.34 mg/dL (L)). Liver Function Tests: Recent Labs  Lab 11/11/17 1543 11/15/17 1706 11/16/17 0518  AST 315* 152* 141*  ALT 260* 167* 143*  ALKPHOS 1,553* 1,233* 1,128*  BILITOT 3.0* 11.1* 10.9*  PROT 7.3 6.6 5.4*  ALBUMIN 3.3* 3.1* 2.4*   Recent Labs  Lab 11/15/17 1706  LIPASE 36   Recent Labs  Lab 11/15/17 1706  AMMONIA 65*   Coagulation Profile: Recent Labs  Lab 11/15/17 1113 11/15/17 1706  INR 0.91 0.95   Cardiac Enzymes: No results for input(s): CKTOTAL, CKMB, CKMBINDEX, TROPONINI in the last 168 hours. BNP (last 3  results) No results for input(s): PROBNP in the last 8760 hours. HbA1C: No results for input(s): HGBA1C in the last 72 hours. CBG: No results for input(s): GLUCAP in the last 168 hours. Lipid Profile: No results for input(s): CHOL, HDL, LDLCALC, TRIG, CHOLHDL, LDLDIRECT in the last 72 hours. Thyroid Function Tests: No results for input(s): TSH, T4TOTAL, FREET4, T3FREE, THYROIDAB in the last 72 hours. Anemia Panel: No results for input(s): VITAMINB12, FOLATE, FERRITIN, TIBC, IRON, RETICCTPCT in the last 72 hours. Sepsis Labs: Recent Labs  Lab 11/15/17 1709  LATICACIDVEN 1.05    Recent Results (from the past 240 hour(s))  Culture, blood (routine x 2)     Status: None (Preliminary result)   Collection Time: 11/15/17  5:06 PM  Result Value Ref Range Status   Specimen Description   Final    BLOOD RIGHT ANTECUBITAL Performed at [facility] [address] [place], [place] [address]    Special Requests   Final    BOTTLES DRAWN AEROBIC AND ANAEROBIC Blood Culture results may not be optimal  due to an excessive volume of blood received in culture bottles Performed at [facility] [address], [place], [place] [address]    Culture   Final    NO GROWTH < 12 HOURS Performed at [facility] [address] [place], [place] [address]    Report Status PENDING  Incomplete  Culture, blood (routine x 2)     Status: None (Preliminary result)   Collection Time: 11/15/17  5:22 PM  Result Value Ref Range Status   Specimen Description   Final    BLOOD RIGHT ANTECUBITAL Performed at [facility] [address] [place], [place] [address]    Special Requests   Final    BOTTLES DRAWN AEROBIC AND ANAEROBIC Blood Culture adequate volume Performed at [facility] [address], [place], [place] [address]    Culture   Final    NO GROWTH < 12 HOURS Performed at [facility] [address] [place], [place] [address]    Report Status PENDING  Incomplete         Radiology Studies: Ct Chest W Contrast  Result Date: 11/15/2017 CLINICAL DATA:  Liver lesions post ultrasound-guided biopsy today. Jaundice. Diffuse pain. Listed history of breast cancer in electronic medical record. EXAM: CT CHEST, ABDOMEN, AND PELVIS WITH CONTRAST TECHNIQUE: Multidetector CT imaging of the chest, abdomen and pelvis was performed following the standard protocol during bolus administration of intravenous contrast. CONTRAST:  139m ISOVUE-300 IOPAMIDOL (ISOVUE-300) INJECTION 61% COMPARISON:  Ultrasound 11/11/2017 FINDINGS: CT CHEST FINDINGS Cardiovascular: Aortic atherosclerosis without aneurysm. The heart is normal in size. There are coronary artery calcifications. No pericardial fluid. Mediastinum/Nodes: No enlarged hilar nodes. 6 mm right epicardial node, no other mediastinal adenopathy. Small lymph nodes in the left axilla largest measuring 7 mm. Surgical clips in the right axilla without right axillary adenopathy. Heterogeneous left lobe  of the thyroid gland with multiple small nodules, largest measuring 10 mm. The esophagus is decompressed. Lungs/Pleura: Calcified granuloma in the right upper lobe series 6, image 36. Small noncalcified nodules including punctate right upper lobe nodule image 35, 4 mm left upper lobe nodule image 65, punctate subpleural right middle lobe nodule image 84, and 3 mm left lower lobe nodule image 94. No confluent airspace disease. No pleural fluid. Musculoskeletal: Primarily sclerotic lesion of T10 with diffuse sclerosis of the the vertebral body, and moth-eaten appearance of both lamina,  sclerosis of the transverse and spinous processes. Sclerotic lesions within posterior T9 vertebral body extending into the left lamina. Mixed lytic and sclerotic lesion posterior T8 vertebral body with likely extension to the left lamina. Sclerotic lesion posterior T11 vertebral body. Trabecular coarsening of T4 vertebral body may simply represent a hemangioma but is nonspecific. Bilateral breast implants. CT ABDOMEN PELVIS FINDINGS Hepatobiliary: Innumerable defined low-density lesions throughout the liver consistent with metastatic disease. Conglomerate lesion in the periphery measures approximately 6.8 x 10 cm with slight capsular retraction. Second dominant lesion in the right lobe measures 8.3 x 5.7 cm. There is intrahepatic biliary ductal dilatation. Dilatation of the common bile duct to 16 mm with abrupt transition, image 52 series 4 with associated soft tissue density. Gallbladder partially distended with diffuse gallbladder wall thickening. Small amount perihepatic ascites, measuring simple fluid density. No evidence of hepatic biopsy complication or hemorrhage. Pancreas: Soft tissue fullness in the uncinate process favored to represent adjacent adenopathy rather than focal pancreatic lesion, however difficult to delineate on CT. No ductal dilatation or inflammation. Spleen: Calcified granuloma. No cystic or solid lesion.  Spleen is normal in size. Adrenals/Urinary Tract: Normal adrenal glands without adrenal nodule. No hydronephrosis or perinephric edema. Homogeneous renal enhancement with symmetric excretion on delayed phase imaging. Urinary bladder is physiologically distended without wall thickening. Stomach/Bowel: Lack of enteric contrast limits bowel assessment. No evidence bowel wall thickening, inflammatory change or obstruction. No obvious focal bowel lesion. Moderate stool in the proximal colon. Normal appendix. Vascular/Lymphatic: Aortic atherosclerosis without aneurysm. Bi-iliac atherosclerosis. Portal vein is patent at the porta hepatis, attenuated at the porta splenic confluence, possible occluded with surrounding collaterals. Splenic vein is patent. Heterogeneous appearance of the superior mesenteric vein may be due to contrast mixing, no discrete thrombus. Suspected porta hepatis and portacaval adenopathy, with ill-defined soft tissue density the portal caval region, difficult to delineate from adjacent structures. Small retroperitoneal nodes. No pelvic adenopathy. Reproductive: Status post hysterectomy. No adnexal masses. Other: Small volume of perihepatic and pelvic ascites.  No free air. Musculoskeletal: Sclerotic lesion within L1 vertebral body extends into the left lamina. Scattered lumbar degenerative change. IMPRESSION: 1. Hepatic metastatic disease with innumerable liver lesions, biopsied earlier today. No evidence of post biopsy hemorrhage or complication. 2. Intrahepatic and proximal extrahepatic biliary ductal dilatation, abrupt transition proximally in the common duct with associated soft tissue density, which may represent adenopathy but is not well delineated. Ill-defined soft tissue density in the porta hepatis and portacaval region is likely adenopathy. This is contiguous with the uncinate process of the pancreas, focal pancreatic lesion difficult to exclude. No pancreatic ductal dilatation. 3.  Attenuated portal vein at the portal splenic confluence, possibly occluded with suspected portal collaterals. Portal vein is otherwise patent. 4. Osseous metastatic disease with primarily sclerotic lesions, primarily involving the spine. Lesions involve T8, T9, T10, T11, and L1. Indeterminate lesion at T4 versus hemangioma. T10 lesion involves the entire vertebral body, with heterogeneous appearance of the lamina and possible extraosseous component and mass-effect on the spinal canal. Recommend thoracic spine MRI to evaluate for cord compression. 5. Nonspecific small pulmonary nodules, largest measuring 4 mm in the left upper lobe. 6. Small amount of perihepatic and pelvic ascites. 7. Aortic Atherosclerosis (ICD10-I70.0). Coronary artery calcifications. Electronically Signed   By: Keith Rake M.D.   On: 11/15/2017 22:09   Ct Abdomen Pelvis W Contrast  Result Date: 11/15/2017 CLINICAL DATA:  Liver lesions post ultrasound-guided biopsy today. Jaundice. Diffuse pain. Listed history of breast cancer in electronic medical  record. EXAM: CT CHEST, ABDOMEN, AND PELVIS WITH CONTRAST TECHNIQUE: Multidetector CT imaging of the chest, abdomen and pelvis was performed following the standard protocol during bolus administration of intravenous contrast. CONTRAST:  125m ISOVUE-300 IOPAMIDOL (ISOVUE-300) INJECTION 61% COMPARISON:  Ultrasound 11/11/2017 FINDINGS: CT CHEST FINDINGS Cardiovascular: Aortic atherosclerosis without aneurysm. The heart is normal in size. There are coronary artery calcifications. No pericardial fluid. Mediastinum/Nodes: No enlarged hilar nodes. 6 mm right epicardial node, no other mediastinal adenopathy. Small lymph nodes in the left axilla largest measuring 7 mm. Surgical clips in the right axilla without right axillary adenopathy. Heterogeneous left lobe of the thyroid gland with multiple small nodules, largest measuring 10 mm. The esophagus is decompressed. Lungs/Pleura: Calcified granuloma in  the right upper lobe series 6, image 36. Small noncalcified nodules including punctate right upper lobe nodule image 35, 4 mm left upper lobe nodule image 65, punctate subpleural right middle lobe nodule image 84, and 3 mm left lower lobe nodule image 94. No confluent airspace disease. No pleural fluid. Musculoskeletal: Primarily sclerotic lesion of T10 with diffuse sclerosis of the the vertebral body, and moth-eaten appearance of both lamina, sclerosis of the transverse and spinous processes. Sclerotic lesions within posterior T9 vertebral body extending into the left lamina. Mixed lytic and sclerotic lesion posterior T8 vertebral body with likely extension to the left lamina. Sclerotic lesion posterior T11 vertebral body. Trabecular coarsening of T4 vertebral body may simply represent a hemangioma but is nonspecific. Bilateral breast implants. CT ABDOMEN PELVIS FINDINGS Hepatobiliary: Innumerable defined low-density lesions throughout the liver consistent with metastatic disease. Conglomerate lesion in the periphery measures approximately 6.8 x 10 cm with slight capsular retraction. Second dominant lesion in the right lobe measures 8.3 x 5.7 cm. There is intrahepatic biliary ductal dilatation. Dilatation of the common bile duct to 16 mm with abrupt transition, image 52 series 4 with associated soft tissue density. Gallbladder partially distended with diffuse gallbladder wall thickening. Small amount perihepatic ascites, measuring simple fluid density. No evidence of hepatic biopsy complication or hemorrhage. Pancreas: Soft tissue fullness in the uncinate process favored to represent adjacent adenopathy rather than focal pancreatic lesion, however difficult to delineate on CT. No ductal dilatation or inflammation. Spleen: Calcified granuloma. No cystic or solid lesion. Spleen is normal in size. Adrenals/Urinary Tract: Normal adrenal glands without adrenal nodule. No hydronephrosis or perinephric edema. Homogeneous  renal enhancement with symmetric excretion on delayed phase imaging. Urinary bladder is physiologically distended without wall thickening. Stomach/Bowel: Lack of enteric contrast limits bowel assessment. No evidence bowel wall thickening, inflammatory change or obstruction. No obvious focal bowel lesion. Moderate stool in the proximal colon. Normal appendix. Vascular/Lymphatic: Aortic atherosclerosis without aneurysm. Bi-iliac atherosclerosis. Portal vein is patent at the porta hepatis, attenuated at the porta splenic confluence, possible occluded with surrounding collaterals. Splenic vein is patent. Heterogeneous appearance of the superior mesenteric vein may be due to contrast mixing, no discrete thrombus. Suspected porta hepatis and portacaval adenopathy, with ill-defined soft tissue density the portal caval region, difficult to delineate from adjacent structures. Small retroperitoneal nodes. No pelvic adenopathy. Reproductive: Status post hysterectomy. No adnexal masses. Other: Small volume of perihepatic and pelvic ascites.  No free air. Musculoskeletal: Sclerotic lesion within L1 vertebral body extends into the left lamina. Scattered lumbar degenerative change. IMPRESSION: 1. Hepatic metastatic disease with innumerable liver lesions, biopsied earlier today. No evidence of post biopsy hemorrhage or complication. 2. Intrahepatic and proximal extrahepatic biliary ductal dilatation, abrupt transition proximally in the common duct with associated soft tissue density,  which may represent adenopathy but is not well delineated. Ill-defined soft tissue density in the porta hepatis and portacaval region is likely adenopathy. This is contiguous with the uncinate process of the pancreas, focal pancreatic lesion difficult to exclude. No pancreatic ductal dilatation. 3. Attenuated portal vein at the portal splenic confluence, possibly occluded with suspected portal collaterals. Portal vein is otherwise patent. 4. Osseous  metastatic disease with primarily sclerotic lesions, primarily involving the spine. Lesions involve T8, T9, T10, T11, and L1. Indeterminate lesion at T4 versus hemangioma. T10 lesion involves the entire vertebral body, with heterogeneous appearance of the lamina and possible extraosseous component and mass-effect on the spinal canal. Recommend thoracic spine MRI to evaluate for cord compression. 5. Nonspecific small pulmonary nodules, largest measuring 4 mm in the left upper lobe. 6. Small amount of perihepatic and pelvic ascites. 7. Aortic Atherosclerosis (ICD10-I70.0). Coronary artery calcifications. Electronically Signed   By: Keith Rake M.D.   On: 11/15/2017 22:09   Mr 3d Recon At Scanner  Addendum Date: 11/16/2017   ADDENDUM REPORT: 11/16/2017 11:55 ADDENDUM: In addition, there are several bone lesions in the visualized lower thoracic and upper lumbar spine, consistent with bone metastases. Electronically Signed   By: Earle Gell M.D.   On: 11/16/2017 11:55   Result Date: 11/16/2017 CLINICAL DATA:  Jaundice. Abdominal pain. Personal history of breast carcinoma. EXAM: MRI ABDOMEN WITHOUT AND WITH CONTRAST (INCLUDING MRCP) TECHNIQUE: Multiplanar multisequence MR imaging of the abdomen was performed both before and after the administration of intravenous contrast. Heavily T2-weighted images of the biliary and pancreatic ducts were obtained, and three-dimensional MRCP images were rendered by post processing. CONTRAST:  6 mL Gadavist COMPARISON:  CT on 11/15/2017 FINDINGS: Lower chest: No acute findings. Hepatobiliary: Numerous hypovascular masses are seen throughout the liver, consistent with diffuse liver metastases. Largest index lesion is located in the posterior right hepatic lobe measuring 8.6 x 5.3 cm. Gallbladder is incompletely distended. Mild wall thickening is seen without pericholecystic inflammatory changes. This is nonspecific and likely due to hepatocellular disease. Marked diffuse  biliary ductal dilatation is seen. 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 This mass is centered in the porta hepatis and abuts the superior margin of the pancreatic head. Differential diagnosis includes pancreatic carcinoma, cholangiocarcinoma, and metastatic porta hepatis lymphadenopathy. Pancreas: 2.1 cm soft tissue mass in the porta hepatis abuts the superior margin of the pancreatic head. Pancreas is otherwise unremarkable in appearance and there is no evidence of pancreatic ductal dilatation. Spleen:  Within normal limits in size and appearance. Adrenals/Urinary Tract: No masses identified. No evidence of hydronephrosis. Stomach/Bowel: Visualized portion unremarkable. Vascular/Lymphatic: Soft-tissue mass in porta hepatis which is contiguous with the pancreatic head may represent lymphadenopathy versus pancreatic or common bile duct neoplasm. No other sites of lymphadenopathy identified. No abdominal aortic aneurysm. Other:  None. Musculoskeletal:  No suspicious bone lesions identified. IMPRESSION: Diffuse liver metastases. Diffuse biliary ductal dilatation due to 2.1 cm soft tissue mass in the porta hepatis and involving the superior margin of the pancreatic head. Differential diagnosis includes primary pancreatic carcinoma, cholangiocarcinoma, and metastatic lymphadenopathy. Consider ERCP for further evaluation. Electronically Signed: By: Earle Gell M.D. On: 11/16/2017 09:49   US Biopsy (liver)  Result Date: 11/15/2017 CLINICAL DATA:  Multiple liver lesions by prior ultrasound. The patient presents for biopsy. EXAM: ULTRASOUND GUIDED CORE BIOPSY OF LIVER COMPARISON:  Prior abdominal ultrasound at  [facility] on 11/11/2017 MEDICATIONS: 2.0 mg IV Versed; 100 mcg IV Fentanyl Total Moderate Sedation Time: 20 minutes. The patient's level of consciousness and physiologic status were  continuously monitored during the procedure by Radiology nursing. PROCEDURE: The procedure, risks, benefits, and alternatives were explained to the patient. Questions regarding the procedure were encouraged and answered. The patient understands and consents to the procedure. A time out was performed prior to initiating the procedure. Ultrasound was performed of the liver and lesions localized for sampling in the left lobe. The abdominal wall was prepped with chlorhexidine in a sterile fashion, and a sterile drape was applied covering the operative field. A sterile gown and sterile gloves were used for the procedure. Local anesthesia was provided with 1% Lidocaine. Under ultrasound guidance, a 17 gauge trocar needle was advanced into the left lobe of the liver. After confirming needle tip position, coaxial 18 gauge core biopsy samples were obtained of 2 separate adjacent liver lesions. A total of 4 core biopsy samples were obtained and submitted in formalin. After the procedure Gel-Foam pledgets were advanced through the outer needle as it was retracted and removed. COMPLICATIONS: None. FINDINGS: Innumerable small hypoechoic lesions are seen throughout the liver parenchyma. 2 adjacent lesions were sampled in the left lobe of the liver measuring roughly 1.0 and 1.3 cm in greatest transverse diameter respectively. Note is made on initial imaging additional biliary ductal dilatation as well, particularly in the left lobe of the liver. This raises the possibility of central biliary obstruction and eventual correlation is suggested with CT of the abdomen and pelvis with IV contrast. Solid tissue was obtained from the to adjacent lesions within the left lobe of the liver. IMPRESSION: Ultrasound-guided core biopsy performed of 2 adjacent solid hypoechoic lesions within the left lobe of the liver. Innumerable lesions are seen throughout the liver parenchyma as well as evidence of biliary obstruction with dilated intrahepatic  ducts, particularly in the left lobe. Recommend eventual correlation with CT of the chest, abdomen and pelvis for further staging. Electronically Signed   By: Aletta Edouard M.D.   On: 11/15/2017 14:33   Mr Abdomen Mrcp Moise Boring Contast  Addendum Date: 11/16/2017   ADDENDUM REPORT: 11/16/2017 11:55 ADDENDUM: In addition, there are several bone lesions in the visualized lower thoracic and upper lumbar spine, consistent with bone metastases. Electronically Signed   By: Earle Gell M.D.   On: 11/16/2017 11:55   Result Date: 11/16/2017 CLINICAL DATA:  Jaundice. Abdominal pain. Personal history of breast carcinoma. EXAM: MRI ABDOMEN WITHOUT AND WITH CONTRAST (INCLUDING MRCP) TECHNIQUE: Multiplanar multisequence MR imaging of the abdomen was performed both before and after the administration of intravenous contrast. Heavily T2-weighted images of the biliary and pancreatic ducts were obtained, and three-dimensional MRCP images were rendered by post processing. CONTRAST:  6 mL Gadavist COMPARISON:  CT on 11/15/2017 FINDINGS: Lower chest: No acute findings. Hepatobiliary: Numerous hypovascular masses are seen throughout the liver, consistent with diffuse liver metastases. Largest index lesion is located in the posterior right hepatic lobe measuring 8.6 x 5.3 cm. Gallbladder is incompletely distended. Mild wall thickening is seen without pericholecystic inflammatory changes. This is nonspecific and likely due to hepatocellular disease. Marked diffuse biliary ductal dilatation is seen. 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 This mass is centered in the porta hepatis and abuts the superior margin of the pancreatic head. Differential  diagnosis includes pancreatic carcinoma, cholangiocarcinoma, and metastatic porta hepatis lymphadenopathy. Pancreas: 2.1 cm soft tissue mass in the porta hepatis abuts the  superior margin of the pancreatic head. Pancreas is otherwise unremarkable in appearance and there is no evidence of pancreatic ductal dilatation. Spleen:  Within normal limits in size and appearance. Adrenals/Urinary Tract: No masses identified. No evidence of hydronephrosis. Stomach/Bowel: Visualized portion unremarkable. Vascular/Lymphatic: Soft-tissue mass in porta hepatis which is contiguous with the pancreatic head may represent lymphadenopathy versus pancreatic or common bile duct neoplasm. No other sites of lymphadenopathy identified. No abdominal aortic aneurysm. Other:  None. Musculoskeletal:  No suspicious bone lesions identified. IMPRESSION: Diffuse liver metastases. Diffuse biliary ductal dilatation due to 2.1 cm soft tissue mass in the porta hepatis and involving the superior margin of the pancreatic head. Differential diagnosis includes primary pancreatic carcinoma, cholangiocarcinoma, and metastatic lymphadenopathy. Consider ERCP for further evaluation. Electronically Signed: By: Earle Gell M.D. On: 11/16/2017 09:49        Scheduled Meds: . calcium-vitamin D  2 tablet Oral Daily  . cholecalciferol  2,000 Units Oral Daily  . FLUoxetine  10 mg Oral Daily  . LORazepam  0.5 mg Oral BID  . losartan  100 mg Oral Daily  . nicotine  21 mg Transdermal Daily   Continuous Infusions: . dextrose 5 % and 0.9% NaCl 125 mL/hr at 11/16/17 0937     LOS: 1 day    Time spent: 40 mins    Irine Seal, MD [facility] Pager [phone]  If 7PM-7AM, please contact night-coverage [email or web address] Password [email or web address] 11/16/2017, 12:47 PM

## 2017-11-17 ENCOUNTER — Inpatient Hospital Stay (HOSPITAL_COMMUNITY): Payer: BLUE CROSS/BLUE SHIELD | Admitting: Anesthesiology

## 2017-11-17 ENCOUNTER — Encounter (HOSPITAL_COMMUNITY): Admission: RE | Admit: 2017-11-17 | Payer: BLUE CROSS/BLUE SHIELD | Source: Ambulatory Visit

## 2017-11-17 ENCOUNTER — Encounter (HOSPITAL_COMMUNITY): Payer: BLUE CROSS/BLUE SHIELD

## 2017-11-17 ENCOUNTER — Ambulatory Visit
Admit: 2017-11-17 | Discharge: 2017-11-17 | Disposition: A | Discharge status: Home / Self Care | Payer: BLUE CROSS/BLUE SHIELD | Attending: Radiation Oncology | Admitting: Radiation Oncology

## 2017-11-17 ENCOUNTER — Encounter (HOSPITAL_COMMUNITY)
Admission: EM | Disposition: A | Discharge status: Home / Self Care | Payer: Self-pay | Source: Home / Self Care | Attending: Internal Medicine

## 2017-11-17 ENCOUNTER — Encounter (HOSPITAL_COMMUNITY): Payer: Self-pay | Admitting: *Deleted

## 2017-11-17 ENCOUNTER — Ambulatory Visit (HOSPITAL_COMMUNITY): Payer: BLUE CROSS/BLUE SHIELD

## 2017-11-17 ENCOUNTER — Inpatient Hospital Stay (HOSPITAL_COMMUNITY): Payer: BLUE CROSS/BLUE SHIELD

## 2017-11-17 DIAGNOSIS — C779 Secondary and unspecified malignant neoplasm of lymph node, unspecified: Secondary | ICD-10-CM

## 2017-11-17 DIAGNOSIS — D630 Anemia in neoplastic disease: Secondary | ICD-10-CM

## 2017-11-17 DIAGNOSIS — R17 Unspecified jaundice: Secondary | ICD-10-CM

## 2017-11-17 DIAGNOSIS — K831 Obstruction of bile duct: Secondary | ICD-10-CM

## 2017-11-17 DIAGNOSIS — C50919 Malignant neoplasm of unspecified site of unspecified female breast: Secondary | ICD-10-CM

## 2017-11-17 DIAGNOSIS — C787 Secondary malignant neoplasm of liver and intrahepatic bile duct: Secondary | ICD-10-CM

## 2017-11-17 DIAGNOSIS — C7951 Secondary malignant neoplasm of bone: Secondary | ICD-10-CM

## 2017-11-17 DIAGNOSIS — Z853 Personal history of malignant neoplasm of breast: Secondary | ICD-10-CM

## 2017-11-17 DIAGNOSIS — Z9221 Personal history of antineoplastic chemotherapy: Secondary | ICD-10-CM

## 2017-11-17 DIAGNOSIS — C221 Intrahepatic bile duct carcinoma: Secondary | ICD-10-CM

## 2017-11-17 DIAGNOSIS — M8458XA Pathological fracture in neoplastic disease, other specified site, initial encounter for fracture: Secondary | ICD-10-CM

## 2017-11-17 DIAGNOSIS — C801 Malignant (primary) neoplasm, unspecified: Secondary | ICD-10-CM

## 2017-11-17 DIAGNOSIS — K769 Liver disease, unspecified: Secondary | ICD-10-CM

## 2017-11-17 DIAGNOSIS — Z9010 Acquired absence of unspecified breast and nipple: Secondary | ICD-10-CM

## 2017-11-17 DIAGNOSIS — K7200 Acute and subacute hepatic failure without coma: Secondary | ICD-10-CM

## 2017-11-17 DIAGNOSIS — R933 Abnormal findings on diagnostic imaging of other parts of digestive tract: Secondary | ICD-10-CM

## 2017-11-17 HISTORY — PX: ERCP: SHX5425

## 2017-11-17 HISTORY — PX: BILIARY STENT PLACEMENT: SHX5538

## 2017-11-17 HISTORY — PX: SPHINCTEROTOMY: SHX5279

## 2017-11-17 SURGERY — ERCP, WITH INTERVENTION IF INDICATED
Anesthesia: General

## 2017-11-17 MED ORDER — LORAZEPAM 2 MG/ML IJ SOLN: 1.0000 mg | Freq: Four times a day (QID) | INTRAMUSCULAR | 0 refills | Status: DC | PRN

## 2017-11-17 MED ORDER — ROCURONIUM BROMIDE 10 MG/ML (PF) SYRINGE
PREFILLED_SYRINGE | INTRAVENOUS | Status: DC | PRN
  Administered 2017-11-17: 10 mg via INTRAVENOUS
  Administered 2017-11-17: 50 mg via INTRAVENOUS

## 2017-11-17 MED ORDER — SODIUM CHLORIDE 0.9 % IV SOLN
INTRAVENOUS | Status: DC | PRN
  Administered 2017-11-17: 50 mL

## 2017-11-17 MED ORDER — HYDROMORPHONE HCL 1 MG/ML IJ SOLN: 1.0000 mg | INTRAMUSCULAR | 0 refills | Status: DC | PRN

## 2017-11-17 MED ORDER — SUGAMMADEX SODIUM 200 MG/2ML IV SOLN
INTRAVENOUS | Status: DC | PRN
  Administered 2017-11-17: 200 mg via INTRAVENOUS

## 2017-11-17 MED ORDER — FENTANYL CITRATE (PF) 100 MCG/2ML IJ SOLN
INTRAMUSCULAR | Status: DC | PRN
  Administered 2017-11-17: 50 ug via INTRAVENOUS

## 2017-11-17 MED ORDER — LACTATED RINGERS IV SOLN
INTRAVENOUS | Status: DC | PRN
  Administered 2017-11-17 (×2): via INTRAVENOUS

## 2017-11-17 MED ORDER — GLUCAGON HCL RDNA (DIAGNOSTIC) 1 MG IJ SOLR
INTRAMUSCULAR | Status: DC | PRN
  Administered 2017-11-17 (×3): .5 mg via INTRAVENOUS

## 2017-11-17 MED ORDER — SODIUM CHLORIDE 0.9 % IV SOLN: INTRAVENOUS | Status: DC

## 2017-11-17 MED ORDER — MIDAZOLAM HCL 5 MG/5ML IJ SOLN
INTRAMUSCULAR | Status: DC | PRN
  Administered 2017-11-17: 2 mg via INTRAVENOUS

## 2017-11-17 MED ORDER — INDOMETHACIN 50 MG RE SUPP
RECTAL | Status: DC | PRN
  Administered 2017-11-17: 100 mg via RECTAL

## 2017-11-17 MED ORDER — ONDANSETRON HCL 4 MG PO TABS: 4.0000 mg | ORAL_TABLET | Freq: Four times a day (QID) | ORAL | 0 refills | Status: DC | PRN

## 2017-11-17 MED ORDER — POLYETHYLENE GLYCOL 3350 17 G PO PACK
17.0000 g | PACK | Freq: Every day | ORAL | Status: DC
  Administered 2017-11-17 – 2017-11-19 (×3): 17 g via ORAL
  Filled 2017-11-17 (×3): qty 1

## 2017-11-17 MED ORDER — HYDROMORPHONE HCL 1 MG/ML IJ SOLN
INTRAMUSCULAR | Status: AC
  Filled 2017-11-17: qty 1

## 2017-11-17 MED ORDER — LIDOCAINE HCL (CARDIAC) PF 100 MG/5ML IV SOSY
PREFILLED_SYRINGE | INTRAVENOUS | Status: DC | PRN
  Administered 2017-11-17: 60 mg via INTRAVENOUS

## 2017-11-17 MED ORDER — INFLUENZA VAC SPLIT QUAD 0.5 ML IM SUSY: 0.5000 mL | PREFILLED_SYRINGE | INTRAMUSCULAR | 0 refills | Status: AC

## 2017-11-17 MED ORDER — HYDROMORPHONE HCL 1 MG/ML IJ SOLN: 1.0000 mg | Freq: Once | INTRAMUSCULAR | Status: AC

## 2017-11-17 MED ORDER — INDOMETHACIN 50 MG RE SUPP: 100.0000 mg | Freq: Once | RECTAL | Status: DC

## 2017-11-17 MED ORDER — CIPROFLOXACIN IN D5W 400 MG/200ML IV SOLN
400.0000 mg | Freq: Once | INTRAVENOUS | Status: AC
  Administered 2017-11-17: 400 mg via INTRAVENOUS

## 2017-11-17 MED ORDER — INDOMETHACIN 50 MG RE SUPP: 100.0000 mg | Freq: Once | RECTAL | 0 refills | Status: DC

## 2017-11-17 MED ORDER — IOPAMIDOL (ISOVUE-300) INJECTION 61%
INTRAVENOUS | Status: AC
  Filled 2017-11-17: qty 50

## 2017-11-17 MED ORDER — NICOTINE 21 MG/24HR TD PT24: 21.0000 mg | MEDICATED_PATCH | Freq: Every day | TRANSDERMAL | 0 refills | Status: DC

## 2017-11-17 MED ORDER — DEXAMETHASONE SODIUM PHOSPHATE 10 MG/ML IJ SOLN
INTRAMUSCULAR | Status: DC | PRN
  Administered 2017-11-17: 10 mg via INTRAVENOUS

## 2017-11-17 MED ORDER — CIPROFLOXACIN IN D5W 400 MG/200ML IV SOLN
INTRAVENOUS | Status: AC
  Filled 2017-11-17: qty 200

## 2017-11-17 MED ORDER — HYDROMORPHONE HCL 1 MG/ML IJ SOLN
1.0000 mg | INTRAMUSCULAR | Status: AC
  Administered 2017-11-17: 1 mg via INTRAVENOUS

## 2017-11-17 MED ORDER — GLUCAGON HCL RDNA (DIAGNOSTIC) 1 MG IJ SOLR
INTRAMUSCULAR | Status: AC
  Filled 2017-11-17: qty 1

## 2017-11-17 MED ORDER — INDOMETHACIN 50 MG RE SUPP
RECTAL | Status: AC
  Filled 2017-11-17: qty 2

## 2017-11-17 MED ORDER — DIPHENHYDRAMINE HCL 25 MG PO CAPS: 25.0000 mg | ORAL_CAPSULE | Freq: Four times a day (QID) | ORAL | Status: DC | PRN

## 2017-11-17 MED ORDER — PROPOFOL 10 MG/ML IV BOLUS
INTRAVENOUS | Status: DC | PRN
  Administered 2017-11-17: 150 mg via INTRAVENOUS

## 2017-11-17 MED ORDER — ONDANSETRON HCL 4 MG/2ML IJ SOLN
INTRAMUSCULAR | Status: DC | PRN
  Administered 2017-11-17: 4 mg via INTRAVENOUS

## 2017-11-17 MED ORDER — SODIUM CHLORIDE 0.9 % IV SOLN
INTRAVENOUS | Status: DC | PRN
  Administered 2017-11-17: 40 ug/min via INTRAVENOUS

## 2017-11-17 NOTE — Progress Notes (Addendum)
Caitlin Pollard   DOB:[birth year]   QJ#:[id number]   LSL#:[id number]  Oncology follow up   Subjective: I was asked by my colleague Dr. Jana Pollard to take over her oncological care. I met pt, her husband Caitlin Pollard and daughter Caitlin Pollard at bed side in the later afternoon. Pt was very drowsy from the ERCP this afternoon, but easily arousable, and participated  the conversation.  According to her husband Caitlin Pollard, she has been very healthy, and active on to 3 to 4 weeks ago, she developed epigastric pain and mid-upper back pain, fatigue and low appetite, jaundice was noticed since last weekend. 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    Objective:  Vitals:   11/17/17 1737 11/17/17 2112  BP: (!) 162/94 (!) 145/100  Pulse: 73 69  Resp:  16  Temp:  (!) 97.5 F (36.4 C)  SpO2:  92%    Body mass index is 21.75 kg/m.  Intake/Output Summary (Last 24 hours) at 11/17/2017 2206 Last data filed at 11/17/2017 1500 Gross per 24 hour  Intake 3314.84 ml  Output -  Net 3314.84 ml     (+) overt jaundice   Oropharynx clear  No peripheral adenopathy  Lungs clear -- no rales or rhonchi  Heart regular rate and rhythm  Abdomen sofe, (+) RUQ tenderness   MSK no focal spinal tenderness, no peripheral edema  Neuro nonfocal  Breast exam: deferred   CBG (last 3)  No results for input(s): GLUCAP in the last 72 hours.   Labs:  Lab Results  Component Value Date   WBC 12.0 (H) 11/16/2017   HGB 10.3 (L) 11/16/2017   HCT 31.1 (L) 11/16/2017   MCV 96.3 11/16/2017   PLT 200 11/16/2017   NEUTROABS 10.8 (H) 11/15/2017   CMP Latest Ref Rng & Units 11/16/2017 11/15/2017 11/11/2017  Glucose 70 - 99 mg/dL 127(H) 119(H) 109(H)  BUN 6 - 20 mg/dL 13 15 12   Creatinine 0.44 - 1.00 mg/dL 0.34(L) 0.61 0.72  Sodium 135 - 145 mmol/L 132(L) 130(L) 133(L)  Potassium 3.5 - 5.1 mmol/L 3.7 3.9 4.5  Chloride 98 - 111 mmol/L 104 98 92(L)  CO2  22 - 32 mmol/L 23 25 28   Calcium 8.9 - 10.3 mg/dL 8.4(L) 8.7(L) 10.1  Total Protein 6.5 - 8.1 g/dL 5.4(L) 6.6 7.3  Total Bilirubin 0.3 - 1.2 mg/dL 10.9(H) 11.1(H) 3.0(H)  Alkaline Phos 38 - 126 U/L 1,128(H) 1,233(H) 1,553(H)  AST 15 - 41 U/L 141(H) 152(H) 315(HH)  ALT 0 - 44 U/L 143(H) 167(H) 260(H)     Urine Studies No results for input(s): UHGB, CRYS in the last 72 hours.  Invalid input(s): UACOL, UAPR, USPG, UPH, UTP, UGL, UKET, UBIL, UNIT, UROB, ULEU, UEPI, UWBC, URBC, UBAC, CAST, UCOM, [place]  Basic Metabolic Panel: Recent Labs  Lab 11/11/17 1543 11/15/17 1706 11/16/17 0518  NA 133* 130* 132*  K 4.5 3.9 3.7  CL 92* 98 104  CO2 28 25 23   GLUCOSE 109* 119* 127*  BUN 12 15 13   CREATININE 0.72 0.61 0.34*  CALCIUM 10.1 8.7* 8.4*   GFR Estimated Creatinine Clearance: 74.5 mL/min (A) (by C-G formula based on SCr of 0.34 mg/dL (L)). Liver Function Tests: Recent Labs  Lab 11/11/17 1543 11/15/17 1706 11/16/17 0518  AST 315* 152* 141*  ALT 260* 167* 143*  ALKPHOS 1,553* 1,233* 1,128*  BILITOT 3.0* 11.1* 10.9*  PROT 7.3 6.6 5.4*  ALBUMIN 3.3* 3.1* 2.4*  Recent Labs  Lab 11/15/17 1706  LIPASE 36   Recent Labs  Lab 11/15/17 1706  AMMONIA 65*   Coagulation profile Recent Labs  Lab 11/15/17 1113 11/15/17 1706  INR 0.91 0.95    CBC: Recent Labs  Lab 11/11/17 1543 11/15/17 1113 11/15/17 1706 11/16/17 0518  WBC 17.6* 15.2* 13.4* 12.0*  NEUTROABS 14.8*  --  10.8*  --   HGB 12.8 12.8 11.7* 10.3*  HCT 37.8 38.0 34.6* 31.1*  MCV 96.4 95.2 94.5 96.3  PLT 305 263 258 200   Cardiac Enzymes: No results for input(s): CKTOTAL, CKMB, CKMBINDEX, TROPONINI in the last 168 hours. BNP: Invalid input(s): POCBNP CBG: No results for input(s): GLUCAP in the last 168 hours. D-Dimer No results for input(s): DDIMER in the last 72 hours. Hgb A1c No results for input(s): HGBA1C in the last 72 hours. Lipid Profile No results for input(s): CHOL, HDL, LDLCALC, TRIG,  CHOLHDL, LDLDIRECT in the last 72 hours. Thyroid function studies No results for input(s): TSH, T4TOTAL, T3FREE, THYROIDAB in the last 72 hours.  Invalid input(s): FREET3 Anemia work up No results for input(s): VITAMINB12, FOLATE, FERRITIN, TIBC, IRON, RETICCTPCT in the last 72 hours. Microbiology Recent Results (from the past 240 hour(s))  Culture, blood (routine x 2)     Status: None (Preliminary result)   Collection Time: 11/15/17  5:06 PM  Result Value Ref Range Status   Specimen Description   Final    BLOOD RIGHT ANTECUBITAL Performed at [facility] [address], [place], [place] [address]    Special Requests   Final    BOTTLES DRAWN AEROBIC AND ANAEROBIC Blood Culture results may not be optimal due to an excessive volume of blood received in culture bottles Performed at [facility] [address] [place], [place] [address]    Culture   Final    NO GROWTH 2 DAYS Performed at [facility] [address] [place], [place] [address]    Report Status PENDING  Incomplete  Culture, blood (routine x 2)     Status: None (Preliminary result)   Collection Time: 11/15/17  5:22 PM  Result Value Ref Range Status   Specimen Description   Final    BLOOD RIGHT ANTECUBITAL Performed at [facility] [address] [place], [place] [address]    Special Requests   Final    BOTTLES DRAWN AEROBIC AND ANAEROBIC Blood Culture adequate volume Performed at [facility] [address], [place], [place] [address]    Culture   Final    NO GROWTH 2 DAYS Performed at [facility] [address], [place], [place] [address]    Report Status PENDING  Incomplete      Studies:  Mr Thoracic Spine W Wo Contrast  Result Date: 11/16/2017 CLINICAL DATA:  Follow-up osseous metastasis. History of breast cancer. EXAM: MRI THORACIC WITHOUT AND WITH CONTRAST TECHNIQUE: Multiplanar and multiecho pulse sequences  of the thoracic spine were obtained without and with intravenous contrast. CONTRAST:  6 cc Gadavist COMPARISON:  CT chest November 15, 2017 FINDINGS: ALIGNMENT: Maintenance of the thoracic kyphosis. No malalignment. VERTEBRAE/DISCS: Diffusely low signal with heterogeneous enhancement T10 vertebral body and posterior elements consistent with metastatic disease. Smaller MR characteristics smaller lesions in T9 and T8. Small metastasis within the posterior T5 and T7 vertebral bodies. Metastasis within the LEFT T12 pedicle and facet and LEFT L1 posterior elements. Known pathologic fractures T10 pedicles less conspicuous by MR.  Generally bright T1 and T2 bone marrow signal T4 consistent with hemangioma. Intervertebral disc morphology generally maintained with mild disc desiccation. CORD: Enhancing tumor within the ventral epidural space from T8-9 to T10-11 (5.7 cm craniocaudad, 5 mm AP). Contiguous tumor within the dorsal epidural space at T9 through T11. Mild spinal cord deformity at T10 due to canal narrowing. No spinal cord edema or syrinx. Conus medullaris terminates at L1-2. No abnormal cord or leptomeningeal enhancement. PREVERTEBRAL AND PARASPINAL SOFT TISSUES: Small amount of tumoral extension into prevertebral fat at T10. Small pleural effusions. Multiple hepatic metastasis better characterized on today's MRI abdomen. DISC LEVELS: Epidural tumor resulting in moderate to severe canal stenosis T10. Tumoral expansion of pedicles resulting in mild neural foraminal narrowing T9-10 and T10-11. IMPRESSION: 1. Multiple osseous metastasis (T5, T7, T8, T9, T10, T12 and L1), known T10 pathologic fracture better seen on yesterday's CT. 2. Epidural tumoral extension from T8-9 to T10-11 resulting in moderate to severe canal stenosis at T10, no spinal cord edema. Electronically Signed   By: Elon Alas M.D.   On: 11/16/2017 22:06   Mr 3d Recon At Scanner  Addendum Date: 11/16/2017   ADDENDUM REPORT: 11/16/2017 11:55  ADDENDUM: In addition, there are several bone lesions in the visualized lower thoracic and upper lumbar spine, consistent with bone metastases. Electronically Signed   By: Earle Gell M.D.   On: 11/16/2017 11:55   Result Date: 11/16/2017 CLINICAL DATA:  Jaundice. Abdominal pain. Personal history of breast carcinoma. EXAM: MRI ABDOMEN WITHOUT AND WITH CONTRAST (INCLUDING MRCP) TECHNIQUE: Multiplanar multisequence MR imaging of the abdomen was performed both before and after the administration of intravenous contrast. Heavily T2-weighted images of the biliary and pancreatic ducts were obtained, and three-dimensional MRCP images were rendered by post processing. CONTRAST:  6 mL Gadavist COMPARISON:  CT on 11/15/2017 FINDINGS: Lower chest: No acute findings. Hepatobiliary: Numerous hypovascular masses are seen throughout the liver, consistent with diffuse liver metastases. Largest index lesion is located in the posterior right hepatic lobe measuring 8.6 x 5.3 cm. Gallbladder is incompletely distended. Mild wall thickening is seen without pericholecystic inflammatory changes. This is nonspecific and likely due to hepatocellular disease. Marked diffuse biliary ductal dilatation is seen. 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 This mass is centered in the porta hepatis and abuts the superior margin of the pancreatic head. Differential diagnosis includes pancreatic carcinoma, cholangiocarcinoma, and metastatic porta hepatis lymphadenopathy. Pancreas: 2.1 cm soft tissue mass in the porta hepatis abuts the superior margin of the pancreatic head. Pancreas is otherwise unremarkable in appearance and there is no evidence of pancreatic ductal dilatation. Spleen:  Within normal limits in size and appearance. Adrenals/Urinary Tract: No masses identified. No evidence of hydronephrosis. Stomach/Bowel: Visualized  portion unremarkable. Vascular/Lymphatic: Soft-tissue mass in porta hepatis which is contiguous with the pancreatic head may represent lymphadenopathy versus pancreatic or common bile duct neoplasm. No other sites of lymphadenopathy identified. No abdominal aortic aneurysm. Other:  None. Musculoskeletal:  No suspicious bone lesions identified. IMPRESSION: Diffuse liver metastases. Diffuse biliary ductal dilatation due to 2.1 cm soft tissue mass in the porta hepatis and involving the superior margin of the pancreatic head. Differential diagnosis includes primary pancreatic carcinoma, cholangiocarcinoma, and metastatic lymphadenopathy. Consider ERCP for further evaluation. Electronically Signed: By: Earle Gell M.D. On: 11/16/2017 09:49   Dg Ercp With Sphincterotomy  Result Date: 11/17/2017 CLINICAL DATA:  Obstructive  jaundice EXAM: ERCP TECHNIQUE: Multiple spot images obtained with the fluoroscopic device and submitted for interpretation post-procedure. COMPARISON:  MRCP 11/16/2017 and previous FINDINGS: Series of fluoroscopic spot images document endoscopic catheterization and opacification of the biliary tree. Diffuse narrowing of the mid and distal CBD, and at the confluence of the biliary ducts. Passage of a balloon catheter through the CBD, and subsequent placement of metallic stent across the CBD, and an overlapping stent into the left lobe duct. No extravasation identified. IMPRESSION: 1. Endoscopic CBD catheterization and intervention including stenting of left and common bile ducts. These images were submitted for radiologic interpretation only. Please see the procedural report for the amount of contrast and the fluoroscopy time utilized. Electronically Signed   By: Lucrezia Europe M.D.   On: 11/17/2017 13:58   Mr Abdomen Mrcp Moise Boring Contast  Addendum Date: 11/16/2017   ADDENDUM REPORT: 11/16/2017 11:55 ADDENDUM: In addition, there are several bone lesions in the visualized lower thoracic and upper lumbar  spine, consistent with bone metastases. Electronically Signed   By: Earle Gell M.D.   On: 11/16/2017 11:55   Result Date: 11/16/2017 CLINICAL DATA:  Jaundice. Abdominal pain. Personal history of breast carcinoma. EXAM: MRI ABDOMEN WITHOUT AND WITH CONTRAST (INCLUDING MRCP) TECHNIQUE: Multiplanar multisequence MR imaging of the abdomen was performed both before and after the administration of intravenous contrast. Heavily T2-weighted images of the biliary and pancreatic ducts were obtained, and three-dimensional MRCP images were rendered by post processing. CONTRAST:  6 mL Gadavist COMPARISON:  CT on 11/15/2017 FINDINGS: Lower chest: No acute findings. Hepatobiliary: Numerous hypovascular masses are seen throughout the liver, consistent with diffuse liver metastases. Largest index lesion is located in the posterior right hepatic lobe measuring 8.6 x 5.3 cm. Gallbladder is incompletely distended. Mild wall thickening is seen without pericholecystic inflammatory changes. This is nonspecific and likely due to hepatocellular disease. Marked diffuse biliary ductal dilatation is seen. 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 This mass is centered in the porta hepatis and abuts the superior margin of the pancreatic head. Differential diagnosis includes pancreatic carcinoma, cholangiocarcinoma, and metastatic porta hepatis lymphadenopathy. Pancreas: 2.1 cm soft tissue mass in the porta hepatis abuts the superior margin of the pancreatic head. Pancreas is otherwise unremarkable in appearance and there is no evidence of pancreatic ductal dilatation. Spleen:  Within normal limits in size and appearance. Adrenals/Urinary Tract: No masses identified. No evidence of hydronephrosis. Stomach/Bowel: Visualized portion unremarkable. Vascular/Lymphatic: Soft-tissue mass in porta hepatis which is contiguous with the  pancreatic head may represent lymphadenopathy versus pancreatic or common bile duct neoplasm. No other sites of lymphadenopathy identified. No abdominal aortic aneurysm. Other:  None. Musculoskeletal:  No suspicious bone lesions identified. IMPRESSION: Diffuse liver metastases. Diffuse biliary ductal dilatation due to 2.1 cm soft tissue mass in the porta hepatis and involving the superior margin of the pancreatic head. Differential diagnosis includes primary pancreatic carcinoma, cholangiocarcinoma, and metastatic lymphadenopathy. Consider ERCP for further evaluation. Electronically Signed: By: Earle Gell M.D. On: 11/16/2017 09:49    Assessment: 57 y.o. with history of breast cancer 20 years ago, otherwise healthy and active, presented with abdominal and back pain for 3 weeks, jaundice for 4 days.  1. 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 Obstructive jaundice, s/p  ERCP and stent placement in CBD and left hepatic duct  4. Mild anemia   Plan:  -I have discussed with pathologist Dr. Jeannie Done, based on the [facility] study and image findings, this is most likely cholangiocarcinoma, although I may want to rule out primary pancreatic cancer (not able to differentiate from cholangiocarcinoma based on path ICH and scan findings). I spoke with Dr. Paulita Fujita today, he feels this is less likely pancreatic primary due to the location of the biliary obstruction. If pt's tbil does not come down as expected by next week, Dr. Paulita Fujita may consider additional stent placement or percutaneous biliary drainage, and he may consider EUS to evaluate the head of pancreas to rule out primary pancreatic cancer -pt has been seen by Dr. Tammi Klippel and plan for palliative RT to her spinal mets, which will probably help her back pain  -Given her personal history of breast cancer, I also  recommend genetic testing, especially for BRCA1/2, this will be done after discharge  -will f/u CBD brushing cytology from today, if positive, will support cholangiocarcinoma  -We discussed the incurable nature of her metastatic cancer, and treatment option for cholangiocarcinoma, mainly systemic chemotherapy, such as cisplatin and gemcitabine.  I will also do [facility] genomic testing to see if she is a candidate for immunotherapy or targeted therapy, although the chances are very small. -I will f/u, and see her back in my clinic in 1-2 weeks. OK to discharge if her pain is controlled, and she will complete radiation as outpt.   I spent a total of 60 mins for her visit today, >50% time on face-to-face counseling.   Truitt Merle, MD 11/17/2017  10:06 PM

## 2017-11-17 NOTE — Op Note (Addendum)
[facility] Patient Name: Caitlin Pollard Procedure Date : 11/17/2017 MRN: [id number] Attending MD: Arta Silence , MD Date of Birth: [birth year] CSN: [id number] Age: 57 Admit Type: Inpatient Procedure:                ERCP Indications:              Abnormal MRCP, Liver metastases with obstruction Providers:                Arta Silence, MD, Cleda Daub, RN, Alan Mulder, Technician, Kerrie Pleasure, CRNA , Clarene Essex, MD Referring MD:              Medicines:                General Anesthesia Complications:            No immediate complications. Estimated Blood Loss:     Estimated blood loss: none. Procedure:                Pre-Anesthesia Assessment:                           - Prior to the procedure, a History and Physical                            was performed, and patient medications and                            allergies were reviewed. The patient's tolerance of                            previous anesthesia was also reviewed. The risks                            and benefits of the procedure and the sedation                            options and risks were discussed with the patient.                            All questions were answered, and informed consent                            was obtained. Prior Anticoagulants: The patient has                            taken no previous anticoagulant or antiplatelet                            agents. ASA Grade Assessment: II - A patient with  mild systemic disease. After reviewing the risks                            and benefits, the patient was deemed in                            satisfactory condition to undergo the procedure.                           After obtaining informed consent, the scope was                            passed under direct vision. Throughout the                            procedure, the patient's blood pressure,  pulse, and                            oxygen saturations were monitored continuously. The                            [id number]-H190 ([id number]) Olympus Adult EGD was introduced                            through the mouth, and a complete endoscopy was                            done by Dr. Paulita Fujita for her dysphagia and no                            significant findings were seen and then he switched                            to the ERCP scope which was used to inject contrast                            into and used for direct visualization of the bile                            duct. The [id number]-Q180V ([id number]) Olympus Duodensocope                            was introduced through the mouth, and used to                            inject contrast into and used to cannulate the bile                            duct. The ERCP was somewhat difficult due to                            abnormal anatomy. Successful completion of the  procedure was aided by performing the maneuvers                            documented (below) in this report. The patient                            tolerated the procedure well. Scope In: Scope Out: Findings:      The major papilla was normal. the wire was advanced one time towards the       pancreas and then selective CBD cannulation was readily obtained and the       wire was advanced into the right system and dye was injected which       confirmed multiple strictured areas and Dr. Paulita Fujita proceeded with       aBiliary sphincterotomy was made with a Hydratome sphincterotome using       ERBE electrocautery. There was no post-sphincterotomy bleeding. To       discover objects, the biliary tree was swept with an adjustable 12- 15       mm balloon starting at the bifurcation. with occlusion cholangiogram we       could not find a dilated right intrahepatic system to stent but there       appeared to be an obvious left stricturebut neither of [place] were  unable to       advance the wire into the left system despite multiple attempts soDr.       Estle Huguley placed One 10 Fr by 6 cm uncovered metal stent was placed 5.5 cm       into the common bile duct. Clear fluid flowed through the stent. The       stent was in good position. I then took over the case and using the       sphincterotome and the Antonietta Breach we were able to cannulate the left system       going through the stent and then we exchanged the sphincterotome for the       smaller adjustable balloon and proceeded with an occlusion cholangiogram       which confirmed the wire in the proper duct and I then placed One 10 Fr       by 6 cm intraductal uncovered metal stent was placed into the left       hepatic duct which then bridge the CBD stent. and both stents were in       good position. the wire introducer and scope were removed and the       patient tolerated the procedure well Impression:               - The major papilla appeared normal. the pre-ERCP                            endoscopy was normal per Dr. Paulita Fujita                           - A biliary sphincterotomy was performed.                           - The biliary tree was swept mostly to proceed with  occlusion cholangiograms and better visualization                            of the duct system.                           - One uncovered metal stent was placed into the                            common bile duct.                           - One uncovered metal stent was placed into the                            left hepatic duct. Recommendation:           - Clear liquid diet for 6 hours.                           - Continue present medications.                           - Return to GI clinic PRN.                           - Telephone GI clinic if symptomatic PRN.                           - Check liver enzymes (AST, ALT, alkaline                            phosphatase, bilirubin) in 5 days. Procedure  Code(s):        --- Professional ---                           [phone], Endoscopic retrograde                            cholangiopancreatography (ERCP); with placement of                            endoscopic stent into biliary or pancreatic duct,                            including pre- and post-dilation and guide wire                            passage, when performed, including sphincterotomy,                            when performed, each stent                           [id number], 60, Endoscopic retrograde  cholangiopancreatography (ERCP); with placement of                            endoscopic stent into biliary or pancreatic duct,                            including pre- and post-dilation and guide wire                            passage, when performed, including sphincterotomy,                            when performed, each stent Diagnosis Code(s):        --- Professional ---                           C78.7, Secondary malignant neoplasm of liver and                            intrahepatic bile duct                           K83.1, Obstruction of bile duct                           R93.2, Abnormal findings on diagnostic imaging of                            liver and biliary tract CPT copyright 2018 [employer]. All rights reserved. The codes documented in this report are preliminary and upon coder review may  be revised to meet current compliance requirements. Arta Silence, MD 11/17/2017 2:07:14 PM This report has been signed electronically. Clarene Essex, MD Number of Addenda: 0

## 2017-11-17 NOTE — Progress Notes (Signed)
PROGRESS NOTE    KARSEN FELLOWS  WUJ:[id number] DOB: 06-08-1960 DOA: 11/15/2017 PCP: Chesley Noon, MD    Brief Narrative:  57 year old female who presented with jaundice.  She does have significant past medical history of breast cancer, hypertension and Inflectra bowel disease.  Currently diagnosed with hepatic lesions, suspicion for metastasis, underwent biopsy.  Postprocedure had significant abdominal pain, associated with weakness and jaundice.  On the initial physical examination temperature 98.7, blood pressure 160/110, heart rate 99, respiratory rate 20, oxygen saturation 95%.  Positive jaundice, moist mucous membranes, lungs clear to auscultation bilaterally, heart S1-S2 present rhythmic, abdomen with mild tenderness, no lower extremity edema.   Patient was admitted to the hospital working diagnosis of obstructive jaundice.  Assessment & Plan:   Principal Problem:   Hepatic failure, acute Active Problems:   Breast cancer, stage 2 (HCC)   HTN (hypertension), benign   Tobacco abuse disorder   Liver lesion   Obstructive jaundice   Right upper quadrant abdominal pain   1. Obstructive jaundice. Will continue supportive medical therapy, with IV fluids, as needed entiemetics and analgesics. Clear liquid diet and follow up liver function test in am.   2. HTN. Will continue to hold on antihypertensive agents for now.   3. Tobacco abuse. Continue smoking cessation.   4. History of breast cancer. Follow with oncology recommendations, possible recurrence.    DVT prophylaxis: enoxaparin   Code Status: full Family Communication: I spoke with patient's family at the bedside and all questions were addressed.  Disposition Plan/ discharge barriers: pending clinical improvement    Consultants:   GI   Procedures:     Antimicrobials:       Subjective: Patient is in pain post procedure, no nausea or vomiting, positive itching generalized.   Objective: Vitals:   11/17/17 1447 11/17/17 1450 11/17/17 1500 11/17/17 1502  BP: (!) 162/94  (!) 143/87 (!) 143/87  Pulse: 86 85 81 81  Resp: 14 16 17 16   Temp:      TempSrc:      SpO2: 96% 94% 93% 96%  Weight:      Height:        Intake/Output Summary (Last 24 hours) at 11/17/2017 1528 Last data filed at 11/17/2017 1408 Gross per 24 hour  Intake 4103.77 ml  Output -  Net 4103.77 ml   Filed Weights   11/15/17 1547 11/16/17 0005  Weight: 63.1 kg 63 kg    Examination:   General: deconditioned and ill looking appearing  Neurology: Awake and alert, non focal  E ENT: no pallor, positive icterus, oral mucosa moist Cardiovascular: No JVD. S1-S2 present, rhythmic, no gallops, rubs, or murmurs. No lower extremity edema. Pulmonary: positive breath sounds bilaterally, adequate air movement, no wheezing, rhonchi or rales. Gastrointestinal. Abdomen distended and tender, no organomegaly, non tender, no rebound or guarding Skin. No rashes Musculoskeletal: no joint deformities     Data Reviewed: I have personally reviewed following labs and imaging studies  CBC: Recent Labs  Lab 11/11/17 1543 11/15/17 1113 11/15/17 1706 11/16/17 0518  WBC 17.6* 15.2* 13.4* 12.0*  NEUTROABS 14.8*  --  10.8*  --   HGB 12.8 12.8 11.7* 10.3*  HCT 37.8 38.0 34.6* 31.1*  MCV 96.4 95.2 94.5 96.3  PLT 305 263 258 956   Basic Metabolic Panel: Recent Labs  Lab 11/11/17 1543 11/15/17 1706 11/16/17 0518  NA 133* 130* 132*  K 4.5 3.9 3.7  CL 92* 98 104  CO2 28 25 23   GLUCOSE  109* 119* 127*  BUN 12 15 13   CREATININE 0.72 0.61 0.34*  CALCIUM 10.1 8.7* 8.4*   GFR: Estimated Creatinine Clearance: 74.5 mL/min (A) (by C-G formula based on SCr of 0.34 mg/dL (L)). Liver Function Tests: Recent Labs  Lab 11/11/17 1543 11/15/17 1706 11/16/17 0518  AST 315* 152* 141*  ALT 260* 167* 143*  ALKPHOS 1,553* 1,233* 1,128*  BILITOT 3.0* 11.1* 10.9*  PROT 7.3 6.6 5.4*  ALBUMIN 3.3* 3.1* 2.4*   Recent Labs  Lab  11/15/17 1706  LIPASE 36   Recent Labs  Lab 11/15/17 1706  AMMONIA 65*   Coagulation Profile: Recent Labs  Lab 11/15/17 1113 11/15/17 1706  INR 0.91 0.95   Cardiac Enzymes: No results for input(s): CKTOTAL, CKMB, CKMBINDEX, TROPONINI in the last 168 hours. BNP (last 3 results) No results for input(s): PROBNP in the last 8760 hours. HbA1C: No results for input(s): HGBA1C in the last 72 hours. CBG: No results for input(s): GLUCAP in the last 168 hours. Lipid Profile: No results for input(s): CHOL, HDL, LDLCALC, TRIG, CHOLHDL, LDLDIRECT in the last 72 hours. Thyroid Function Tests: No results for input(s): TSH, T4TOTAL, FREET4, T3FREE, THYROIDAB in the last 72 hours. Anemia Panel: No results for input(s): VITAMINB12, FOLATE, FERRITIN, TIBC, IRON, RETICCTPCT in the last 72 hours.    Radiology Studies: I have reviewed all of the imaging during this hospital visit personally     Scheduled Meds: . calcium-vitamin D  2 tablet Oral Daily  . cholecalciferol  2,000 Units Oral Daily  . FLUoxetine  10 mg Oral Daily  . indomethacin  100 mg Rectal Once  . Influenza vac split quadrivalent PF  0.5 mL Intramuscular Tomorrow-1000  . LORazepam  0.5 mg Oral BID  . losartan  100 mg Oral Daily  . nicotine  21 mg Transdermal Daily   Continuous Infusions: . dextrose 5 % and 0.9% NaCl 125 mL/hr at 11/17/17 0303     LOS: 2 days        Tawni Millers, MD [facility] Pager [phone]

## 2017-11-17 NOTE — Anesthesia Postprocedure Evaluation (Signed)
Anesthesia Post Note  Patient: Caitlin Pollard  Procedure(s) Performed: ENDOSCOPIC RETROGRADE CHOLANGIOPANCREATOGRAPHY (ERCP) (N/A ) ESOPHAGOGASTRODUODENOSCOPY (EGD) (N/A ) [place] SWEEP     Patient location during evaluation: PACU Anesthesia Type: General Level of consciousness: sedated Pain management: pain level controlled Vital Signs Assessment: post-procedure vital signs reviewed and stable Respiratory status: spontaneous breathing and respiratory function stable Cardiovascular status: stable Postop Assessment: no apparent nausea or vomiting Anesthetic complications: no    Last Vitals:  Vitals:   11/17/17 1500 11/17/17 1502  BP: (!) 143/87 (!) 143/87  Pulse: 81 81  Resp: 17 16  Temp:    SpO2: 93% 96%    Last Pain:  Vitals:   11/17/17 1502  TempSrc:   PainSc: 4                  Nysa Sarin DANIEL

## 2017-11-17 NOTE — Transfer of Care (Signed)
Immediate Anesthesia Transfer of Care Note  Patient: [name]  Procedure(s) Performed: ENDOSCOPIC RETROGRADE CHOLANGIOPANCREATOGRAPHY (ERCP) (N/A ) ESOPHAGOGASTRODUODENOSCOPY (EGD) (N/A ) BILIARY STENT PLACEMENT  Patient Location: Endoscopy Unit  Anesthesia Type:General  Level of Consciousness: awake, alert , oriented and patient cooperative  Airway & Oxygen Therapy: Patient Spontanous Breathing and Patient connected to nasal cannula oxygen  Post-op Assessment: Report given to RN, Post -op Vital signs reviewed and stable and Patient moving all extremities X 4  Post vital signs: Reviewed and stable  Last Vitals:  Vitals Value Taken Time  BP 163/98 11/17/2017  2:07 PM  Temp 37.3 C 11/17/2017  2:07 PM  Pulse 80 11/17/2017  2:07 PM  Resp 21 11/17/2017  2:07 PM  SpO2 99 % 11/17/2017  2:07 PM  Vitals shown include unvalidated device data.  Last Pain:  Vitals:   11/17/17 1407  TempSrc: Oral  PainSc: 0-No pain      Patients Stated Pain Goal: 2 (51/02/58 5277)  Complications: No apparent anesthesia complications

## 2017-11-17 NOTE — Interval H&P Note (Signed)
History and Physical Interval Note:  11/17/2017 11:48 AM  Caitlin Pollard  has presented today for surgery, with the diagnosis of stricture- jaundice  The various methods of treatment have been discussed with the patient and family. After consideration of risks, benefits and other options for treatment, the patient has consented to  Procedure(s) with comments: ENDOSCOPIC RETROGRADE CHOLANGIOPANCREATOGRAPHY (ERCP) (N/A) - 6&8 metal uncovered stents- discussed with Larene Beach as a surgical intervention .  The patient's history has been reviewed, patient examined, no change in status, stable for surgery.  I have reviewed the patient's chart and labs.  Questions were answered to the patient's satisfaction.     Malick Netz M  Assessment:  1.  Dysphagia. 2.  Obstructive jaundice. 3.  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  Plan:  1.  Endoscopy with possible esophageal dilatation. 2.  Risks (bleeding, infection, bowel perforation that could require surgery, sedation-related changes in cardiopulmonary systems), benefits (identification and possible treatment of source of symptoms, exclusion of certain causes of symptoms), and alternatives (watchful waiting, radiographic imaging studies, empiric medical treatment) of upper endoscopy with possible esophageal dilatation (EGD +/- DIL) were explained to patient/family in detail and patient wishes to proceed. 3.  Endoscopic retrograde cholangiopancreatography with possible bile duct stenting, possible cholangioscopy. 4.  Risks (up to and including bleeding, infection, perforation, pancreatitis that can be complicated by infected necrosis and death), benefits (removal of stones, alleviating blockage, decreasing risk of cholangitis or choledocholithiasis-related pancreatitis), and alternatives (watchful waiting, percutaneous transhepatic cholangiography) of ERCP were  explained to patient/family in detail and patient elects to proceed.

## 2017-11-17 NOTE — Anesthesia Preprocedure Evaluation (Addendum)
Anesthesia Evaluation  Patient identified by MRN, date of birth, ID band Patient awake    Reviewed: Allergy & Precautions, NPO status , Patient's Chart, lab work & pertinent test results  History of Anesthesia Complications Negative for: history of anesthetic complications  Airway Mallampati: II  TM Distance: >3 FB Neck ROM: Full    Dental no notable dental hx. (+) Dental Advisory Given, Teeth Intact   Pulmonary neg pulmonary ROS,    Pulmonary exam normal        Cardiovascular hypertension, Pt. on medications Normal cardiovascular exam     Neuro/Psych negative neurological ROS  negative psych ROS   GI/Hepatic negative GI ROS, Br Ca/Liver Mets   Endo/Other  negative endocrine ROS  Renal/GU negative Renal ROS  negative genitourinary   Musculoskeletal negative musculoskeletal ROS (+)   Abdominal   Peds negative pediatric ROS (+)  Hematology negative hematology ROS (+)   Anesthesia Other Findings   Reproductive/Obstetrics negative OB ROS                           Anesthesia Physical Anesthesia Plan  ASA: III  Anesthesia Plan: General   Post-op Pain Management:    Induction: Intravenous  PONV Risk Score and Plan: 3 and Ondansetron, Dexamethasone and Diphenhydramine  Airway Management Planned: Oral ETT  Additional Equipment:   Intra-op Plan:   Post-operative Plan: Extubation in OR  Informed Consent: I have reviewed the patients History and Physical, chart, labs and discussed the procedure including the risks, benefits and alternatives for the proposed anesthesia with the patient or authorized representative who has indicated his/her understanding and acceptance.   Dental advisory given  Plan Discussed with: CRNA, Anesthesiologist and Surgeon  Anesthesia Plan Comments:       Anesthesia Quick Evaluation

## 2017-11-17 NOTE — Consult Note (Signed)
Radiation Oncology         [phone]) [phone] ________________________________  Name: [name]        MRN: [id number]  Date of Service: 11/16/17 DOB: Apr 06, 1960  CC:Chesley Noon, MD  No ref. provider found     REFERRING PHYSICIAN: No ref. provider found   DIAGNOSIS: The primary encounter diagnosis was Metastatic cancer to liver [facility]). Diagnoses of Jaundice and Abnormal magnetic resonance cholangiopancreatography (MRCP) were also pertinent to this visit.   HISTORY OF PRESENT ILLNESS: Caitlin Pollard is a 57 y.o. female seen at the request of Dr. Jana Hakim and Dr. Grandville Silos for metastatic disease in the T spine. The patient has a remote history of breat cancer treated in 1999 with right mastectomy and bilateral reconstruction followed by adjuvant chemotherapy followed by 10 years of antiestrogen therapy. She has been NED for many years. It appears in 2006 she had an episode of abdominal pain and GI symptoms following a trip overseas where others were also ill in her party. She had what was felt to be a pancreatic phlegmon which was evaluated at [facility] and she reports an EUS was performed and biopsy that was negative for malignancy. The patient reports she had been travelling in [place] about 3 weeks ago and started having some mid thoracic back pain and abdominal pain along with mild weight loss, though in retrospect, she had been trying to change her diet as of March 2019 and had lost about 12 pounds. She presented to PCP for evaluation and when she developed upper abdominal pain she was sent for abdominal ultrasound on 11/11/17 that revealed multiple hepatic lesions concerning for metastatic disease. She met urgently with Dr. Jana Hakim the same day and recommendations were to proceed with liver biopsy. She was admitted however on 11/15/17 with acute development of jaundice. She underwent US guided liver biopsy, and CT C/A/P also on admission revealed innumerable liver lesions, intrahepatic and proximal  extrahepatic biliary ductal dilitation with soft tissue possibly representing adenopathy versus focal pancreatic head lesion. There were portal venous and portal splenic confluence anomalies with possible collateral flow. Sclerotic lesions were also noted involving T8, T9, T10, T11, and L1. Probable hemangioma at T4 was noted, and there was complete involvement of the T10 vertebral body with possible extraosseous component and mass effect on the canal. Nonspecific nodules in the lungs, the largetst was noted to be 4 mm in the LUL. She had a small amount of perihepatic and pelvic ascites. She also has undergone evaluation with MRCP today that confirmed the CT findings in the liver, and identified an abrupt stricture of the mid to distal CBD due to a 2.1 x 2.1 cm soft tissue mass centered in the porta hepatis and abuts the superior margin of the head of the pancreas. She is scheduled to go to [facility] on 11/17/17 for ERCP with CBD stenting with Dr. Paulita Fujita. It is notable that she had outpt tumor markers:  CA 19-9: 15,854 CEA: 10.64 CA27.29: 149.7.  When she presented to the ED, she was found to have ALK Phos 1553, T.Bilirubin 3, AST 315, ALT 260. She was not hypercoagulable based on PT/INR, and her kidney function was preserved. Ammonia level is elevated at 65, and lipase was normal at 36. Her biopsy is pending pathology, and we are asked to see the patient to discuss options of radiotherapy to the spine.    PREVIOUS RADIATION THERAPY: No   PAST MEDICAL HISTORY:  Past Medical History:  Diagnosis Date  . Breast  cancer ([place])   . Breast cancer, stage 2 ([place]) 04/05/2011  . HTN (hypertension), benign 04/05/2011  . Hyperglycemia without ketosis 04/05/2011  . IBD (inflammatory bowel disease) 04/05/2011       PAST SURGICAL HISTORY: Past Surgical History:  Procedure Laterality Date  . ABDOMINAL HYSTERECTOMY    . AUGMENTATION MAMMAPLASTY Bilateral   . KNEE SURGERY    . MASTECTOMY Right   . WRIST SURGERY         FAMILY HISTORY:  Family History  Problem Relation Age of Onset  . Cancer Father      SOCIAL HISTORY:  reports that she has never smoked. She has never used smokeless tobacco. She reports that she drinks alcohol. She reports that she does not use drugs. The patient is married and lives in [place]. She lives in [place] and has two daughters and her husband who accompany her. She is a family friend of Dr. Johny Shears and her daughter Gilford Rile is a PA at [employer] in the ED.   ALLERGIES: Cholestatin   MEDICATIONS:  Current Facility-Administered Medications  Medication Dose Route Frequency Provider Last Rate Last Dose  . calcium-vitamin D (OSCAL WITH D) 500-200 MG-UNIT per tablet 2 tablet  2 tablet Oral Daily Elwyn Reach, MD   2 tablet at 11/16/17 0936  . cholecalciferol (VITAMIN D) tablet 2,000 Units  2,000 Units Oral Daily Elwyn Reach, MD   2,000 Units at 11/16/17 0936  . dextrose 5 %-0.9 % sodium chloride infusion   Intravenous Continuous Elwyn Reach, MD 125 mL/hr at 11/17/17 0303    . FLUoxetine (PROZAC) capsule 10 mg  10 mg Oral Daily Elwyn Reach, MD   10 mg at 11/16/17 0936  . HYDROmorphone (DILAUDID) injection 1 mg  1 mg Intravenous Q3H PRN Elwyn Reach, MD   1 mg at 11/17/17 0306  . Influenza vac split quadrivalent PF (FLUARIX) injection 0.5 mL  0.5 mL Intramuscular Tomorrow-1000 Eugenie Filler, MD      . LORazepam (ATIVAN) injection 1 mg  1 mg Intravenous Q6H PRN Charlesetta Shanks, MD   1 mg at 11/15/17 2329  . LORazepam (ATIVAN) tablet 0.5 mg  0.5 mg Oral BID Gala Romney L, MD   0.5 mg at 11/16/17 2155  . losartan (COZAAR) tablet 100 mg  100 mg Oral Daily Gala Romney L, MD   100 mg at 11/16/17 0936  . nicotine (NICODERM CQ - dosed in mg/24 hours) patch 21 mg  21 mg Transdermal Daily Garba, Mohammad L, MD      . ondansetron (ZOFRAN) tablet 4 mg  4 mg Oral Q6H PRN Elwyn Reach, MD       Or  . ondansetron (ZOFRAN) injection 4 mg  4  mg Intravenous Q6H PRN Elwyn Reach, MD   4 mg at 11/16/17 0031     REVIEW OF SYSTEMS: On review of systems, the patient reports that she is very anxious about the process and visibly upset during our discussion. She is supported by her husband and two daughters. She denies any numbness in her chest wall, upper abdomen, or loss of sensation or central weakening. She denies any loss of control of her lower extremities, falls, confusion, difficulty with bowel or bladder function. No other new joint aches or pains are noted. A complete review of systems is obtained and is otherwise negative.     PHYSICAL EXAM:  Wt Readings from Last 3 Encounters:  11/16/17 138 lb 14.4 oz (63 kg)  11/11/17 139 lb 4.8 oz (63.2 kg)  04/11/13 135 lb 6.4 oz (61.4 kg)   Temp Readings from Last 3 Encounters:  11/17/17 99.5 F (37.5 C) (Oral)  11/15/17 98 F (36.7 C)  11/15/17 98.1 F (36.7 C) (Oral)   BP Readings from Last 3 Encounters:  11/17/17 (!) 146/93  11/15/17 (!) 130/92  11/15/17 (!) 134/91   Pulse Readings from Last 3 Encounters:  11/17/17 90  11/15/17 79  11/15/17 92   Pain Assessment Pain Score: Asleep/10  In general this is a jaundiced appearing caucasian female in no acute distress. She is alert and oriented x4 and appropriate throughout the examination. HEENT reveals that the patient is normocephalic, atraumatic. EOMs are intact. She has bilateral scleral icterus. Skin is intact without any evidence of gross lesions, but is also jaundiced.  Cardiopulmonary assessment is negative for acute distress and she exhibits normal effort.    ECOG = 2  0 - Asymptomatic (Fully active, able to carry on all predisease activities without restriction)  1 - Symptomatic but completely ambulatory (Restricted in physically strenuous activity but ambulatory and able to carry out work of a light or sedentary nature. For example, light housework, office work)  2 - Symptomatic, <50% in bed during the day  (Ambulatory and capable of all self care but unable to carry out any work activities. Up and about more than 50% of waking hours)  3 - Symptomatic, >50% in bed, but not bedbound (Capable of only limited self-care, confined to bed or chair 50% or more of waking hours)  4 - Bedbound (Completely disabled. Cannot carry on any self-care. Totally confined to bed or chair)  5 - Death   Eustace Pen MM, Creech RH, Tormey DC, et al. [phone]). "Toxicity and response criteria of the [facility] Group". [place] Oncol. 5 (6): 649-55    LABORATORY DATA:  Lab Results  Component Value Date   WBC 12.0 (H) 11/16/2017   HGB 10.3 (L) 11/16/2017   HCT 31.1 (L) 11/16/2017   MCV 96.3 11/16/2017   PLT 200 11/16/2017   Lab Results  Component Value Date   NA 132 (L) 11/16/2017   K 3.7 11/16/2017   CL 104 11/16/2017   CO2 23 11/16/2017   Lab Results  Component Value Date   ALT 143 (H) 11/16/2017   AST 141 (H) 11/16/2017   ALKPHOS 1,128 (H) 11/16/2017   BILITOT 10.9 (H) 11/16/2017      RADIOGRAPHY: Ct Chest W Contrast  Result Date: 11/15/2017 CLINICAL DATA:  Liver lesions post ultrasound-guided biopsy today. Jaundice. Diffuse pain. Listed history of breast cancer in electronic medical record. EXAM: CT CHEST, ABDOMEN, AND PELVIS WITH CONTRAST TECHNIQUE: Multidetector CT imaging of the chest, abdomen and pelvis was performed following the standard protocol during bolus administration of intravenous contrast. CONTRAST:  12m ISOVUE-300 IOPAMIDOL (ISOVUE-300) INJECTION 61% COMPARISON:  Ultrasound 11/11/2017 FINDINGS: CT CHEST FINDINGS Cardiovascular: Aortic atherosclerosis without aneurysm. The heart is normal in size. There are coronary artery calcifications. No pericardial fluid. Mediastinum/Nodes: No enlarged hilar nodes. 6 mm right epicardial node, no other mediastinal adenopathy. Small lymph nodes in the left axilla largest measuring 7 mm. Surgical clips in the right axilla without right  axillary adenopathy. Heterogeneous left lobe of the thyroid gland with multiple small nodules, largest measuring 10 mm. The esophagus is decompressed. Lungs/Pleura: Calcified granuloma in the right upper lobe series 6, image 36. Small noncalcified nodules including punctate right upper lobe nodule image 35, 4 mm left upper  lobe nodule image 65, punctate subpleural right middle lobe nodule image 84, and 3 mm left lower lobe nodule image 94. No confluent airspace disease. No pleural fluid. Musculoskeletal: Primarily sclerotic lesion of T10 with diffuse sclerosis of the the vertebral body, and moth-eaten appearance of both lamina, sclerosis of the transverse and spinous processes. Sclerotic lesions within posterior T9 vertebral body extending into the left lamina. Mixed lytic and sclerotic lesion posterior T8 vertebral body with likely extension to the left lamina. Sclerotic lesion posterior T11 vertebral body. Trabecular coarsening of T4 vertebral body may simply represent a hemangioma but is nonspecific. Bilateral breast implants. CT ABDOMEN PELVIS FINDINGS Hepatobiliary: Innumerable defined low-density lesions throughout the liver consistent with metastatic disease. Conglomerate lesion in the periphery measures approximately 6.8 x 10 cm with slight capsular retraction. Second dominant lesion in the right lobe measures 8.3 x 5.7 cm. There is intrahepatic biliary ductal dilatation. Dilatation of the common bile duct to 16 mm with abrupt transition, image 52 series 4 with associated soft tissue density. Gallbladder partially distended with diffuse gallbladder wall thickening. Small amount perihepatic ascites, measuring simple fluid density. No evidence of hepatic biopsy complication or hemorrhage. Pancreas: Soft tissue fullness in the uncinate process favored to represent adjacent adenopathy rather than focal pancreatic lesion, however difficult to delineate on CT. No ductal dilatation or inflammation. Spleen:  Calcified granuloma. No cystic or solid lesion. Spleen is normal in size. Adrenals/Urinary Tract: Normal adrenal glands without adrenal nodule. No hydronephrosis or perinephric edema. Homogeneous renal enhancement with symmetric excretion on delayed phase imaging. Urinary bladder is physiologically distended without wall thickening. Stomach/Bowel: Lack of enteric contrast limits bowel assessment. No evidence bowel wall thickening, inflammatory change or obstruction. No obvious focal bowel lesion. Moderate stool in the proximal colon. Normal appendix. Vascular/Lymphatic: Aortic atherosclerosis without aneurysm. Bi-iliac atherosclerosis. Portal vein is patent at the porta hepatis, attenuated at the porta splenic confluence, possible occluded with surrounding collaterals. Splenic vein is patent. Heterogeneous appearance of the superior mesenteric vein may be due to contrast mixing, no discrete thrombus. Suspected porta hepatis and portacaval adenopathy, with ill-defined soft tissue density the portal caval region, difficult to delineate from adjacent structures. Small retroperitoneal nodes. No pelvic adenopathy. Reproductive: Status post hysterectomy. No adnexal masses. Other: Small volume of perihepatic and pelvic ascites.  No free air. Musculoskeletal: Sclerotic lesion within L1 vertebral body extends into the left lamina. Scattered lumbar degenerative change. IMPRESSION: 1. Hepatic metastatic disease with innumerable liver lesions, biopsied earlier today. No evidence of post biopsy hemorrhage or complication. 2. Intrahepatic and proximal extrahepatic biliary ductal dilatation, abrupt transition proximally in the common duct with associated soft tissue density, which may represent adenopathy but is not well delineated. Ill-defined soft tissue density in the porta hepatis and portacaval region is likely adenopathy. This is contiguous with the uncinate process of the pancreas, focal pancreatic lesion difficult to  exclude. No pancreatic ductal dilatation. 3. Attenuated portal vein at the portal splenic confluence, possibly occluded with suspected portal collaterals. Portal vein is otherwise patent. 4. Osseous metastatic disease with primarily sclerotic lesions, primarily involving the spine. Lesions involve T8, T9, T10, T11, and L1. Indeterminate lesion at T4 versus hemangioma. T10 lesion involves the entire vertebral body, with heterogeneous appearance of the lamina and possible extraosseous component and mass-effect on the spinal canal. Recommend thoracic spine MRI to evaluate for cord compression. 5. Nonspecific small pulmonary nodules, largest measuring 4 mm in the left upper lobe. 6. Small amount of perihepatic and pelvic ascites. 7. Aortic Atherosclerosis (ICD10-I70.0).  Coronary artery calcifications. Electronically Signed   By: Keith Rake M.D.   On: 11/15/2017 22:09   Mr Thoracic Spine W Wo Contrast  Result Date: 11/16/2017 CLINICAL DATA:  Follow-up osseous metastasis. History of breast cancer. EXAM: MRI THORACIC WITHOUT AND WITH CONTRAST TECHNIQUE: Multiplanar and multiecho pulse sequences of the thoracic spine were obtained without and with intravenous contrast. CONTRAST:  6 cc Gadavist COMPARISON:  CT chest November 15, 2017 FINDINGS: ALIGNMENT: Maintenance of the thoracic kyphosis. No malalignment. VERTEBRAE/DISCS: Diffusely low signal with heterogeneous enhancement T10 vertebral body and posterior elements consistent with metastatic disease. Smaller MR characteristics smaller lesions in T9 and T8. Small metastasis within the posterior T5 and T7 vertebral bodies. Metastasis within the LEFT T12 pedicle and facet and LEFT L1 posterior elements. Known pathologic fractures T10 pedicles less conspicuous by MR. Generally bright T1 and T2 bone marrow signal T4 consistent with hemangioma. Intervertebral disc morphology generally maintained with mild disc desiccation. CORD: Enhancing tumor within the ventral  epidural space from T8-9 to T10-11 (5.7 cm craniocaudad, 5 mm AP). Contiguous tumor within the dorsal epidural space at T9 through T11. Mild spinal cord deformity at T10 due to canal narrowing. No spinal cord edema or syrinx. Conus medullaris terminates at L1-2. No abnormal cord or leptomeningeal enhancement. PREVERTEBRAL AND PARASPINAL SOFT TISSUES: Small amount of tumoral extension into prevertebral fat at T10. Small pleural effusions. Multiple hepatic metastasis better characterized on today's MRI abdomen. DISC LEVELS: Epidural tumor resulting in moderate to severe canal stenosis T10. Tumoral expansion of pedicles resulting in mild neural foraminal narrowing T9-10 and T10-11. IMPRESSION: 1. Multiple osseous metastasis (T5, T7, T8, T9, T10, T12 and L1), known T10 pathologic fracture better seen on yesterday's CT. 2. Epidural tumoral extension from T8-9 to T10-11 resulting in moderate to severe canal stenosis at T10, no spinal cord edema. Electronically Signed   By: Elon Alas M.D.   On: 11/16/2017 22:06   Ct Abdomen Pelvis W Contrast  Result Date: 11/15/2017 CLINICAL DATA:  Liver lesions post ultrasound-guided biopsy today. Jaundice. Diffuse pain. Listed history of breast cancer in electronic medical record. EXAM: CT CHEST, ABDOMEN, AND PELVIS WITH CONTRAST TECHNIQUE: Multidetector CT imaging of the chest, abdomen and pelvis was performed following the standard protocol during bolus administration of intravenous contrast. CONTRAST:  16m ISOVUE-300 IOPAMIDOL (ISOVUE-300) INJECTION 61% COMPARISON:  Ultrasound 11/11/2017 FINDINGS: CT CHEST FINDINGS Cardiovascular: Aortic atherosclerosis without aneurysm. The heart is normal in size. There are coronary artery calcifications. No pericardial fluid. Mediastinum/Nodes: No enlarged hilar nodes. 6 mm right epicardial node, no other mediastinal adenopathy. Small lymph nodes in the left axilla largest measuring 7 mm. Surgical clips in the right axilla without  right axillary adenopathy. Heterogeneous left lobe of the thyroid gland with multiple small nodules, largest measuring 10 mm. The esophagus is decompressed. Lungs/Pleura: Calcified granuloma in the right upper lobe series 6, image 36. Small noncalcified nodules including punctate right upper lobe nodule image 35, 4 mm left upper lobe nodule image 65, punctate subpleural right middle lobe nodule image 84, and 3 mm left lower lobe nodule image 94. No confluent airspace disease. No pleural fluid. Musculoskeletal: Primarily sclerotic lesion of T10 with diffuse sclerosis of the the vertebral body, and moth-eaten appearance of both lamina, sclerosis of the transverse and spinous processes. Sclerotic lesions within posterior T9 vertebral body extending into the left lamina. Mixed lytic and sclerotic lesion posterior T8 vertebral body with likely extension to the left lamina. Sclerotic lesion posterior T11 vertebral body. Trabecular coarsening of  T4 vertebral body may simply represent a hemangioma but is nonspecific. Bilateral breast implants. CT ABDOMEN PELVIS FINDINGS Hepatobiliary: Innumerable defined low-density lesions throughout the liver consistent with metastatic disease. Conglomerate lesion in the periphery measures approximately 6.8 x 10 cm with slight capsular retraction. Second dominant lesion in the right lobe measures 8.3 x 5.7 cm. There is intrahepatic biliary ductal dilatation. Dilatation of the common bile duct to 16 mm with abrupt transition, image 52 series 4 with associated soft tissue density. Gallbladder partially distended with diffuse gallbladder wall thickening. Small amount perihepatic ascites, measuring simple fluid density. No evidence of hepatic biopsy complication or hemorrhage. Pancreas: Soft tissue fullness in the uncinate process favored to represent adjacent adenopathy rather than focal pancreatic lesion, however difficult to delineate on CT. No ductal dilatation or inflammation. Spleen:  Calcified granuloma. No cystic or solid lesion. Spleen is normal in size. Adrenals/Urinary Tract: Normal adrenal glands without adrenal nodule. No hydronephrosis or perinephric edema. Homogeneous renal enhancement with symmetric excretion on delayed phase imaging. Urinary bladder is physiologically distended without wall thickening. Stomach/Bowel: Lack of enteric contrast limits bowel assessment. No evidence bowel wall thickening, inflammatory change or obstruction. No obvious focal bowel lesion. Moderate stool in the proximal colon. Normal appendix. Vascular/Lymphatic: Aortic atherosclerosis without aneurysm. Bi-iliac atherosclerosis. Portal vein is patent at the porta hepatis, attenuated at the porta splenic confluence, possible occluded with surrounding collaterals. Splenic vein is patent. Heterogeneous appearance of the superior mesenteric vein may be due to contrast mixing, no discrete thrombus. Suspected porta hepatis and portacaval adenopathy, with ill-defined soft tissue density the portal caval region, difficult to delineate from adjacent structures. Small retroperitoneal nodes. No pelvic adenopathy. Reproductive: Status post hysterectomy. No adnexal masses. Other: Small volume of perihepatic and pelvic ascites.  No free air. Musculoskeletal: Sclerotic lesion within L1 vertebral body extends into the left lamina. Scattered lumbar degenerative change. IMPRESSION: 1. Hepatic metastatic disease with innumerable liver lesions, biopsied earlier today. No evidence of post biopsy hemorrhage or complication. 2. Intrahepatic and proximal extrahepatic biliary ductal dilatation, abrupt transition proximally in the common duct with associated soft tissue density, which may represent adenopathy but is not well delineated. Ill-defined soft tissue density in the porta hepatis and portacaval region is likely adenopathy. This is contiguous with the uncinate process of the pancreas, focal pancreatic lesion difficult to  exclude. No pancreatic ductal dilatation. 3. Attenuated portal vein at the portal splenic confluence, possibly occluded with suspected portal collaterals. Portal vein is otherwise patent. 4. Osseous metastatic disease with primarily sclerotic lesions, primarily involving the spine. Lesions involve T8, T9, T10, T11, and L1. Indeterminate lesion at T4 versus hemangioma. T10 lesion involves the entire vertebral body, with heterogeneous appearance of the lamina and possible extraosseous component and mass-effect on the spinal canal. Recommend thoracic spine MRI to evaluate for cord compression. 5. Nonspecific small pulmonary nodules, largest measuring 4 mm in the left upper lobe. 6. Small amount of perihepatic and pelvic ascites. 7. Aortic Atherosclerosis (ICD10-I70.0). Coronary artery calcifications. Electronically Signed   By: Keith Rake M.D.   On: 11/15/2017 22:09   Mr 3d Recon At Scanner  Addendum Date: 11/16/2017   ADDENDUM REPORT: 11/16/2017 11:55 ADDENDUM: In addition, there are several bone lesions in the visualized lower thoracic and upper lumbar spine, consistent with bone metastases. Electronically Signed   By: Earle Gell M.D.   On: 11/16/2017 11:55   Result Date: 11/16/2017 CLINICAL DATA:  Jaundice. Abdominal pain. Personal history of breast carcinoma. EXAM: MRI ABDOMEN WITHOUT AND WITH CONTRAST (  INCLUDING MRCP) TECHNIQUE: Multiplanar multisequence MR imaging of the abdomen was performed both before and after the administration of intravenous contrast. Heavily T2-weighted images of the biliary and pancreatic ducts were obtained, and three-dimensional MRCP images were rendered by post processing. CONTRAST:  6 mL Gadavist COMPARISON:  CT on 11/15/2017 FINDINGS: Lower chest: No acute findings. Hepatobiliary: Numerous hypovascular masses are seen throughout the liver, consistent with diffuse liver metastases. Largest index lesion is located in the posterior right hepatic lobe measuring 8.6 x 5.3  cm. Gallbladder is incompletely distended. Mild wall thickening is seen without pericholecystic inflammatory changes. This is nonspecific and likely due to hepatocellular disease. Marked diffuse biliary ductal dilatation is seen. 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 This mass is centered in the porta hepatis and abuts the superior margin of the pancreatic head. Differential diagnosis includes pancreatic carcinoma, cholangiocarcinoma, and metastatic porta hepatis lymphadenopathy. Pancreas: 2.1 cm soft tissue mass in the porta hepatis abuts the superior margin of the pancreatic head. Pancreas is otherwise unremarkable in appearance and there is no evidence of pancreatic ductal dilatation. Spleen:  Within normal limits in size and appearance. Adrenals/Urinary Tract: No masses identified. No evidence of hydronephrosis. Stomach/Bowel: Visualized portion unremarkable. Vascular/Lymphatic: Soft-tissue mass in porta hepatis which is contiguous with the pancreatic head may represent lymphadenopathy versus pancreatic or common bile duct neoplasm. No other sites of lymphadenopathy identified. No abdominal aortic aneurysm. Other:  None. Musculoskeletal:  No suspicious bone lesions identified. IMPRESSION: Diffuse liver metastases. Diffuse biliary ductal dilatation due to 2.1 cm soft tissue mass in the porta hepatis and involving the superior margin of the pancreatic head. Differential diagnosis includes primary pancreatic carcinoma, cholangiocarcinoma, and metastatic lymphadenopathy. Consider ERCP for further evaluation. Electronically Signed: By: Earle Gell M.D. On: 11/16/2017 09:49   US Biopsy (liver)  Result Date: 11/15/2017 CLINICAL DATA:  Multiple liver lesions by prior ultrasound. The patient presents for biopsy. EXAM: ULTRASOUND GUIDED CORE BIOPSY OF LIVER COMPARISON:  Prior abdominal ultrasound at  [facility] on 11/11/2017 MEDICATIONS: 2.0 mg IV Versed; 100 mcg IV Fentanyl Total Moderate Sedation Time: 20 minutes. The patient's level of consciousness and physiologic status were continuously monitored during the procedure by Radiology nursing. PROCEDURE: The procedure, risks, benefits, and alternatives were explained to the patient. Questions regarding the procedure were encouraged and answered. The patient understands and consents to the procedure. A time out was performed prior to initiating the procedure. Ultrasound was performed of the liver and lesions localized for sampling in the left lobe. The abdominal wall was prepped with chlorhexidine in a sterile fashion, and a sterile drape was applied covering the operative field. A sterile gown and sterile gloves were used for the procedure. Local anesthesia was provided with 1% Lidocaine. Under ultrasound guidance, a 17 gauge trocar needle was advanced into the left lobe of the liver. After confirming needle tip position, coaxial 18 gauge core biopsy samples were obtained of 2 separate adjacent liver lesions. A total of 4 core biopsy samples were obtained and submitted in formalin. After the procedure Gel-Foam pledgets were advanced through the outer needle as it was retracted and removed. COMPLICATIONS: None. FINDINGS: Innumerable small hypoechoic lesions are seen throughout the liver parenchyma. 2 adjacent lesions were sampled in the left lobe of the liver measuring roughly 1.0 and 1.3 cm in greatest transverse diameter respectively. Note is made on initial imaging additional biliary  ductal dilatation as well, particularly in the left lobe of the liver. This raises the possibility of central biliary obstruction and eventual correlation is suggested with CT of the abdomen and pelvis with IV contrast. Solid tissue was obtained from the to adjacent lesions within the left lobe of the liver. IMPRESSION: Ultrasound-guided core biopsy performed of 2  adjacent solid hypoechoic lesions within the left lobe of the liver. Innumerable lesions are seen throughout the liver parenchyma as well as evidence of biliary obstruction with dilated intrahepatic ducts, particularly in the left lobe. Recommend eventual correlation with CT of the chest, abdomen and pelvis for further staging. Electronically Signed   By: Aletta Edouard M.D.   On: 11/15/2017 14:33   Mr Abdomen Mrcp Moise Boring Contast  Addendum Date: 11/16/2017   ADDENDUM REPORT: 11/16/2017 11:55 ADDENDUM: In addition, there are several bone lesions in the visualized lower thoracic and upper lumbar spine, consistent with bone metastases. Electronically Signed   By: Earle Gell M.D.   On: 11/16/2017 11:55   Result Date: 11/16/2017 CLINICAL DATA:  Jaundice. Abdominal pain. Personal history of breast carcinoma. EXAM: MRI ABDOMEN WITHOUT AND WITH CONTRAST (INCLUDING MRCP) TECHNIQUE: Multiplanar multisequence MR imaging of the abdomen was performed both before and after the administration of intravenous contrast. Heavily T2-weighted images of the biliary and pancreatic ducts were obtained, and three-dimensional MRCP images were rendered by post processing. CONTRAST:  6 mL Gadavist COMPARISON:  CT on 11/15/2017 FINDINGS: Lower chest: No acute findings. Hepatobiliary: Numerous hypovascular masses are seen throughout the liver, consistent with diffuse liver metastases. Largest index lesion is located in the posterior right hepatic lobe measuring 8.6 x 5.3 cm. Gallbladder is incompletely distended. Mild wall thickening is seen without pericholecystic inflammatory changes. This is nonspecific and likely due to hepatocellular disease. Marked diffuse biliary ductal dilatation is seen. 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 This mass is centered in the porta hepatis and abuts the superior margin of the  pancreatic head. Differential diagnosis includes pancreatic carcinoma, cholangiocarcinoma, and metastatic porta hepatis lymphadenopathy. Pancreas: 2.1 cm soft tissue mass in the porta hepatis abuts the superior margin of the pancreatic head. Pancreas is otherwise unremarkable in appearance and there is no evidence of pancreatic ductal dilatation. Spleen:  Within normal limits in size and appearance. Adrenals/Urinary Tract: No masses identified. No evidence of hydronephrosis. Stomach/Bowel: Visualized portion unremarkable. Vascular/Lymphatic: Soft-tissue mass in porta hepatis which is contiguous with the pancreatic head may represent lymphadenopathy versus pancreatic or common bile duct neoplasm. No other sites of lymphadenopathy identified. No abdominal aortic aneurysm. Other:  None. Musculoskeletal:  No suspicious bone lesions identified. IMPRESSION: Diffuse liver metastases. Diffuse biliary ductal dilatation due to 2.1 cm soft tissue mass in the porta hepatis and involving the superior margin of the pancreatic head. Differential diagnosis includes primary pancreatic carcinoma, cholangiocarcinoma, and metastatic lymphadenopathy. Consider ERCP for further evaluation. Electronically Signed: By: Earle Gell M.D. On: 11/16/2017 09:49   [place] Outside [employer] Body  Result Date: 11/15/2017 This examination belongs to an outside facility and is stored here for comparison purposes only.  Contact the originating outside institution for any associated report or interpretation.      IMPRESSION/PLAN: 1. 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 Dr. Lisbeth Renshaw and Dr. Tammi Klippel have reviewed the patient's case. Unfortunately we do not have tissue confirmation but anticipate that this will be available tomorrow. We will follow up  with pathology regarding results. The concern is that she may have a cholangiocarcinoma versus small bowel malignancy versus pancreatic cancer. She has such a remote history of breast cancer this is much less  likely a recurrence. We have reviewed her CT based imaging and the concern for the proximity of her disease in the thoracic spine to the canal. I have spoken with neuroradiology and we have permission to proceed with MRI of the thoracic spine tonight and appreciate their willingness so we can determine the proximity of her disease to the cord. She does not have symptoms of neurologic compromise. We would like to proceed with palliative radiotherapy which we would anticipate simulation for tomorrow as well if we can work around the logistics for her going to [facility] for ERCP and stenting. If this is not possible, we would plan mark and start simulation and treatment on Thursday. We reviewed the risks, benefits, short and long term effects of radiotherapy, and will proceed with consent at the time of simulation. The patient is in agreemeent with this plan. She will also be transferred to Dr. Ernestina Penna service if her disease is confirmed as GI. I will review her case as well with Dr. Burr Medico. 2. Remote history of right breast cancer. As above, we will follow this expectantly and she will continue under the care of Dr. Jana Hakim. 3. Pain secondary to thoracic disease secondary to #1. The patient will continue prn narcotics and we will anticipate relief during and or following radiotherapy.  In a visit lasting 70 minutes, greater than 50% of the time was spent face to face discussing her case, and in floor time coordinating the patient's care.    Carola Rhine, PAC

## 2017-11-17 NOTE — Discharge Instructions (Signed)
Call if question or problem otherwise clear liquids until 7 PM and if doing well may have soft solids and have liver tests repeated at your oncology appointment next week or in our office

## 2017-11-17 NOTE — Anesthesia Procedure Notes (Signed)
Procedure Name: Intubation Date/Time: 11/17/2017 12:11 PM Performed by: Carney Living, CRNA Pre-anesthesia Checklist: Patient identified, Emergency Drugs available, Suction available, Patient being monitored and Timeout performed Patient Re-evaluated:Patient Re-evaluated prior to induction Oxygen Delivery Method: Circle system utilized Induction Type: IV induction Ventilation: Mask ventilation without difficulty Laryngoscope Size: Mac and 4 Grade View: Grade I Tube type: Oral Tube size: 7.0 mm Number of attempts: 1 Airway Equipment and Method: Stylet Placement Confirmation: ETT inserted through vocal cords under direct vision,  positive ETCO2 and breath sounds checked- equal and bilateral Secured at: 21 cm Tube secured with: Tape Dental Injury: Teeth and Oropharynx as per pre-operative assessment

## 2017-11-18 ENCOUNTER — Ambulatory Visit
Admit: 2017-11-18 | Discharge: 2017-11-18 | Disposition: A | Discharge status: Home / Self Care | Payer: BLUE CROSS/BLUE SHIELD | Attending: Radiation Oncology | Admitting: Radiation Oncology

## 2017-11-18 ENCOUNTER — Encounter: Payer: Self-pay | Admitting: Hematology

## 2017-11-18 ENCOUNTER — Encounter (HOSPITAL_COMMUNITY): Payer: Self-pay | Admitting: Gastroenterology

## 2017-11-18 ENCOUNTER — Telehealth: Payer: Self-pay | Admitting: Hematology

## 2017-11-18 ENCOUNTER — Ambulatory Visit
Admit: 2017-11-18 | Discharge: 2017-11-18 | Disposition: A | Discharge status: Home / Self Care | Payer: BLUE CROSS/BLUE SHIELD | Source: Ambulatory Visit | Attending: Radiation Oncology | Admitting: Radiation Oncology

## 2017-11-18 DIAGNOSIS — C221 Intrahepatic bile duct carcinoma: Secondary | ICD-10-CM

## 2017-11-18 DIAGNOSIS — C787 Secondary malignant neoplasm of liver and intrahepatic bile duct: Secondary | ICD-10-CM | POA: Insufficient documentation

## 2017-11-18 DIAGNOSIS — C7951 Secondary malignant neoplasm of bone: Secondary | ICD-10-CM

## 2017-11-18 DIAGNOSIS — C7952 Secondary malignant neoplasm of bone marrow: Principal | ICD-10-CM

## 2017-11-18 DIAGNOSIS — Z853 Personal history of malignant neoplasm of breast: Secondary | ICD-10-CM | POA: Insufficient documentation

## 2017-11-18 DIAGNOSIS — Z510 Encounter for antineoplastic radiation therapy: Secondary | ICD-10-CM | POA: Insufficient documentation

## 2017-11-18 LAB — CBC WITH DIFFERENTIAL/PLATELET
ABS IMMATURE GRANULOCYTES: 0.12 10*3/uL — AB (ref 0.00–0.07)
Basophils Absolute: 0 10*3/uL (ref 0.0–0.1)
Basophils Relative: 0 %
EOS PCT: 0 %
Eosinophils Absolute: 0 10*3/uL (ref 0.0–0.5)
HEMATOCRIT: 33.6 % — AB (ref 36.0–46.0)
HEMOGLOBIN: 11.5 g/dL — AB (ref 12.0–15.0)
Immature Granulocytes: 1 %
LYMPHS ABS: 0.7 10*3/uL (ref 0.7–4.0)
Lymphocytes Relative: 5 %
MCH: 31.9 pg (ref 26.0–34.0)
MCHC: 34.2 g/dL (ref 30.0–36.0)
MCV: 93.1 fL (ref 80.0–100.0)
MONO ABS: 1.3 10*3/uL — AB (ref 0.1–1.0)
Monocytes Relative: 8 %
NEUTROS ABS: 13.1 10*3/uL — AB (ref 1.7–7.7)
Neutrophils Relative %: 86 %
PLATELETS: 293 10*3/uL (ref 150–400)
RBC: 3.61 MIL/uL — ABNORMAL LOW (ref 3.87–5.11)
RDW: 13.6 % (ref 11.5–15.5)
WBC: 15.2 10*3/uL — ABNORMAL HIGH (ref 4.0–10.5)
nRBC: 0 % (ref 0.0–0.2)

## 2017-11-18 LAB — HEPATIC FUNCTION PANEL
ALBUMIN: 2.1 g/dL — AB (ref 3.5–5.0)
ALT: 130 U/L — ABNORMAL HIGH (ref 0–44)
AST: 153 U/L — ABNORMAL HIGH (ref 15–41)
Alkaline Phosphatase: 1066 U/L — ABNORMAL HIGH (ref 38–126)
BILIRUBIN INDIRECT: 3.3 mg/dL — AB (ref 0.3–0.9)
Bilirubin, Direct: 6.6 mg/dL — ABNORMAL HIGH (ref 0.0–0.2)
TOTAL PROTEIN: 5.2 g/dL — AB (ref 6.5–8.1)
Total Bilirubin: 9.9 mg/dL — ABNORMAL HIGH (ref 0.3–1.2)

## 2017-11-18 LAB — HIV ANTIBODY (ROUTINE TESTING W REFLEX): HIV SCREEN 4TH GENERATION: REACTIVE — AB

## 2017-11-18 LAB — BASIC METABOLIC PANEL
Anion gap: 9 (ref 5–15)
BUN: 8 mg/dL (ref 6–20)
CO2: 24 mmol/L (ref 22–32)
CREATININE: 0.31 mg/dL — AB (ref 0.44–1.00)
Calcium: 8.3 mg/dL — ABNORMAL LOW (ref 8.9–10.3)
Chloride: 99 mmol/L (ref 98–111)
Glucose, Bld: 157 mg/dL — ABNORMAL HIGH (ref 70–99)
Potassium: 3.7 mmol/L (ref 3.5–5.1)
SODIUM: 132 mmol/L — AB (ref 135–145)

## 2017-11-18 LAB — RNA QUALITATIVE: HIV 1 RNA Qualitative: 1

## 2017-11-18 LAB — HIV 1/2 AB DIFFERENTIATION
HIV 1 AB: NEGATIVE
HIV 2 Ab: NEGATIVE
Note: NEGATIVE

## 2017-11-18 MED ORDER — PROMETHAZINE HCL 25 MG/ML IJ SOLN: 6.2500 mg | Freq: Four times a day (QID) | INTRAMUSCULAR | Status: DC | PRN

## 2017-11-18 MED ORDER — PANTOPRAZOLE SODIUM 40 MG PO TBEC
40.0000 mg | DELAYED_RELEASE_TABLET | Freq: Every day | ORAL | Status: DC
  Administered 2017-11-18 – 2017-11-19 (×2): 40 mg via ORAL
  Filled 2017-11-18 (×2): qty 1

## 2017-11-18 MED ORDER — KETOROLAC TROMETHAMINE 15 MG/ML IJ SOLN
15.0000 mg | Freq: Four times a day (QID) | INTRAMUSCULAR | Status: DC | PRN
  Administered 2017-11-18 – 2017-11-19 (×2): 15 mg via INTRAVENOUS
  Filled 2017-11-18 (×2): qty 1

## 2017-11-18 MED ORDER — BISACODYL 5 MG PO TBEC
5.0000 mg | DELAYED_RELEASE_TABLET | Freq: Every day | ORAL | Status: DC
  Administered 2017-11-18 – 2017-11-19 (×2): 5 mg via ORAL
  Filled 2017-11-18 (×2): qty 1

## 2017-11-18 MED ORDER — OXYCODONE HCL 5 MG PO TABS
5.0000 mg | ORAL_TABLET | ORAL | Status: DC | PRN
  Administered 2017-11-18 – 2017-11-19 (×6): 5 mg via ORAL
  Filled 2017-11-18 (×6): qty 1

## 2017-11-18 MED ORDER — PROMETHAZINE HCL 25 MG/ML IJ SOLN
12.5000 mg | Freq: Four times a day (QID) | INTRAMUSCULAR | Status: DC | PRN
  Administered 2017-11-18: 12.5 mg via INTRAVENOUS
  Filled 2017-11-18 (×2): qty 1

## 2017-11-18 NOTE — Telephone Encounter (Signed)
Lft the pt a vm with appt date and time to see Dr. Burr Medico  For a hospital follow up on 10/29 at 115pm. Letter mailed.

## 2017-11-18 NOTE — Progress Notes (Signed)
Subjective: Abdominal soreness little better than yesterday. Some bloating.  No bowel movement in 3 days.  Objective: Vital signs in last 24 hours: Temp:  [97.5 F (36.4 C)-99.1 F (37.3 C)] 98 F (36.7 C) (10/24 1100) Pulse Rate:  [69-88] 82 (10/24 1100) Resp:  [14-24] 16 (10/24 1100) BP: (120-169)/(78-109) 129/91 (10/24 1100) SpO2:  [92 %-99 %] 97 % (10/24 1100) Weight change:  Last BM Date: 11/15/17  PE: GEN:  NAD, jaundiced ABD:  Mild protuberant, mild tender diffusely  Lab Results: CBC    Component Value Date/Time   WBC 15.2 (H) 11/18/2017 0353   RBC 3.61 (L) 11/18/2017 0353   HGB 11.5 (L) 11/18/2017 0353   HGB 14.7 04/11/2013 1041   HCT 33.6 (L) 11/18/2017 0353   HCT 44.2 04/11/2013 1041   PLT 293 11/18/2017 0353   PLT 168 04/11/2013 1041   MCV 93.1 11/18/2017 0353   MCV 101.5 (H) 04/11/2013 1041   MCH 31.9 11/18/2017 0353   MCHC 34.2 11/18/2017 0353   RDW 13.6 11/18/2017 0353   RDW 13.1 04/11/2013 1041   LYMPHSABS 0.7 11/18/2017 0353   LYMPHSABS 0.9 04/11/2013 1041   MONOABS 1.3 (H) 11/18/2017 0353   MONOABS 0.5 04/11/2013 1041   EOSABS 0.0 11/18/2017 0353   EOSABS 0.1 04/11/2013 1041   BASOSABS 0.0 11/18/2017 0353   BASOSABS 0.1 04/11/2013 1041   CMP     Component Value Date/Time   NA 132 (L) 11/18/2017 0353   NA 139 04/11/2013 1041   K 3.7 11/18/2017 0353   K 4.8 04/11/2013 1041   CL 99 11/18/2017 0353   CL 102 04/15/2012 1230   CO2 24 11/18/2017 0353   CO2 26 04/11/2013 1041   GLUCOSE 157 (H) 11/18/2017 0353   GLUCOSE 131 04/11/2013 1041   GLUCOSE 105 (H) 04/15/2012 1230   BUN 8 11/18/2017 0353   BUN 12.0 04/11/2013 1041   CREATININE 0.31 (L) 11/18/2017 0353   CREATININE 0.7 04/11/2013 1041   CALCIUM 8.3 (L) 11/18/2017 0353   CALCIUM 9.9 04/11/2013 1041   PROT 5.2 (L) 11/18/2017 0353   PROT 6.8 04/11/2013 1041   ALBUMIN 2.1 (L) 11/18/2017 0353   ALBUMIN 4.4 04/11/2013 1041   AST 153 (H) 11/18/2017 0353   AST 26 04/11/2013 1041   ALT  130 (H) 11/18/2017 0353   ALT 23 04/11/2013 1041   ALKPHOS 1,066 (H) 11/18/2017 0353   ALKPHOS 63 04/11/2013 1041   BILITOT 9.9 (H) 11/18/2017 0353   BILITOT 0.61 04/11/2013 1041   GFRNONAA >60 11/18/2017 0353   GFRAA >60 11/18/2017 0353   Assessment:  Obstructive jaundice; CBD and left intrahepatic stents placed.  Some improvement of LFTs. Constipation. 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  Plan:  1.  Bowel regimen. 2.  Mobilize, OOBTC as tolerated. 3.  Advance diet as tolerated. 4.  Radiation to spine starting today. 5.  Discussed with Dr. Burr Medico; plan for EUS +/- repeat ERCP for further stenting in the next couple weeks (giving enough time to see whether LFTs downtrend in which case ERCP might not be needed again, as LFTs need to downtrend before chemotherapy can begin). 6.  [facility] will follow.  If doing ok tomorrow, and downtrending LFTs, maybe home tomorrow from GI perspective with further endoscopic GI work-up as above to occur likely as outpatient.   [name] 11/18/2017, 1:10 PM   Cell [phone] If no answer or after 5 PM call [phone]

## 2017-11-18 NOTE — Progress Notes (Signed)
PT Cancellation Note  Patient Details Name: Caitlin Pollard MRN: [id number] DOB: 19-Dec-1960   Cancelled Treatment:    Reason Eval/Treat Not Completed: Other (comment);Fatigue/lethargy limiting ability to participate(Deferred by RN ) - RN states pt ambulated unit x2 earlier today, has been nauseous and vomiting, and is not appropriate at this time for eval. PT to check back tomorrow.   Julien Girt, PT Acute Rehabilitation Services Pager [phone]  Office [phone]    Roxine Caddy D Elonda Husky 11/18/2017, 5:28 PM

## 2017-11-18 NOTE — Progress Notes (Signed)
Caitlin Pollard   DOB:1960/07/08   OV#:[id number]   OAC#:[id number]  Oncology follow up   Subjective: Caitlin Pollard is clinically stable, feels better than yesterday, pain is controlled, vital signs stable.  She is tolerating radiation well.  Objective:  Vitals:   11/18/17 1100 11/18/17 1422  BP: (!) 129/91 (!) 135/92  Pulse: 82 86  Resp: 16 16  Temp: 98 F (36.7 C) (!) 97.5 F (36.4 C)  SpO2: 97% 100%    Body mass index is 21.75 kg/m.  Intake/Output Summary (Last 24 hours) at 11/18/2017 1818 Last data filed at 11/18/2017 1300 Gross per 24 hour  Intake 1616.81 ml  Output -  Net 1616.81 ml     (+) overt jaundice   Oropharynx clear  No peripheral adenopathy  Lungs clear -- no rales or rhonchi  Heart regular rate and rhythm  Abdomen sofe, (+) RUQ tenderness   MSK no focal spinal tenderness, no peripheral edema  Neuro nonfocal  Breast exam: deferred   CBG (last 3)  No results for input(s): GLUCAP in the last 72 hours.   Labs:  Lab Results  Component Value Date   WBC 15.2 (H) 11/18/2017   HGB 11.5 (L) 11/18/2017   HCT 33.6 (L) 11/18/2017   MCV 93.1 11/18/2017   PLT 293 11/18/2017   NEUTROABS 13.1 (H) 11/18/2017   CMP Latest Ref Rng & Units 11/18/2017 11/16/2017 11/15/2017  Glucose 70 - 99 mg/dL 157(H) 127(H) 119(H)  BUN 6 - 20 mg/dL 8 13 15   Creatinine 0.44 - 1.00 mg/dL 0.31(L) 0.34(L) 0.61  Sodium 135 - 145 mmol/L 132(L) 132(L) 130(L)  Potassium 3.5 - 5.1 mmol/L 3.7 3.7 3.9  Chloride 98 - 111 mmol/L 99 104 98  CO2 22 - 32 mmol/L 24 23 25   Calcium 8.9 - 10.3 mg/dL 8.3(L) 8.4(L) 8.7(L)  Total Protein 6.5 - 8.1 g/dL 5.2(L) 5.4(L) 6.6  Total Bilirubin 0.3 - 1.2 mg/dL 9.9(H) 10.9(H) 11.1(H)  Alkaline Phos 38 - 126 U/L 1,066(H) 1,128(H) 1,233(H)  AST 15 - 41 U/L 153(H) 141(H) 152(H)  ALT 0 - 44 U/L 130(H) 143(H) 167(H)     Urine Studies No results for input(s): UHGB, CRYS in the last 72 hours.  Invalid input(s): UACOL, UAPR, USPG, UPH, UTP, UGL, UKET, UBIL, UNIT,  UROB, ULEU, UEPI, UWBC, URBC, UBAC, CAST, UCOM, [place]  Basic Metabolic Panel: Recent Labs  Lab 11/15/17 1706 11/16/17 0518 11/18/17 0353  NA 130* 132* 132*  K 3.9 3.7 3.7  CL 98 104 99  CO2 25 23 24   GLUCOSE 119* 127* 157*  BUN 15 13 8   CREATININE 0.61 0.34* 0.31*  CALCIUM 8.7* 8.4* 8.3*   GFR Estimated Creatinine Clearance: 74.5 mL/min (A) (by C-G formula based on SCr of 0.31 mg/dL (L)). Liver Function Tests: Recent Labs  Lab 11/15/17 1706 11/16/17 0518 11/18/17 0353  AST 152* 141* 153*  ALT 167* 143* 130*  ALKPHOS 1,233* 1,128* 1,066*  BILITOT 11.1* 10.9* 9.9*  PROT 6.6 5.4* 5.2*  ALBUMIN 3.1* 2.4* 2.1*   Recent Labs  Lab 11/15/17 1706  LIPASE 36   Recent Labs  Lab 11/15/17 1706  AMMONIA 65*   Coagulation profile Recent Labs  Lab 11/15/17 1113 11/15/17 1706  INR 0.91 0.95    CBC: Recent Labs  Lab 11/15/17 1113 11/15/17 1706 11/16/17 0518 11/18/17 0353  WBC 15.2* 13.4* 12.0* 15.2*  NEUTROABS  --  10.8*  --  13.1*  HGB 12.8 11.7* 10.3* 11.5*  HCT 38.0 34.6* 31.1* 33.6*  MCV  95.2 94.5 96.3 93.1  PLT 263 258 200 293   Cardiac Enzymes: No results for input(s): CKTOTAL, CKMB, CKMBINDEX, TROPONINI in the last 168 hours. BNP: Invalid input(s): POCBNP CBG: No results for input(s): GLUCAP in the last 168 hours. D-Dimer No results for input(s): DDIMER in the last 72 hours. Hgb A1c No results for input(s): HGBA1C in the last 72 hours. Lipid Profile No results for input(s): CHOL, HDL, LDLCALC, TRIG, CHOLHDL, LDLDIRECT in the last 72 hours. Thyroid function studies No results for input(s): TSH, T4TOTAL, T3FREE, THYROIDAB in the last 72 hours.  Invalid input(s): FREET3 Anemia work up No results for input(s): VITAMINB12, FOLATE, FERRITIN, TIBC, IRON, RETICCTPCT in the last 72 hours. Microbiology Recent Results (from the past 240 hour(s))  Culture, blood (routine x 2)     Status: None (Preliminary result)   Collection Time: 11/15/17  5:06 PM   Result Value Ref Range Status   Specimen Description   Final    BLOOD RIGHT ANTECUBITAL Performed at [facility] [address] [place], [place] [address]    Special Requests   Final    BOTTLES DRAWN AEROBIC AND ANAEROBIC Blood Culture results may not be optimal due to an excessive volume of blood received in culture bottles Performed at [facility] [address], [place], [place] [address]    Culture   Final    NO GROWTH 3 DAYS Performed at [facility], [place] [address], [place], [place] [address]    Report Status PENDING  Incomplete  Culture, blood (routine x 2)     Status: None (Preliminary result)   Collection Time: 11/15/17  5:22 PM  Result Value Ref Range Status   Specimen Description   Final    BLOOD RIGHT ANTECUBITAL Performed at [facility] [address], [place], [place] [address]    Special Requests   Final    BOTTLES DRAWN AEROBIC AND ANAEROBIC Blood Culture adequate volume Performed at [facility] [address] [place], [place] [address]    Culture   Final    NO GROWTH 3 DAYS Performed at [facility], [place] [address], [place], [place] [address]    Report Status PENDING  Incomplete      Studies:  Mr Thoracic Spine W Wo Contrast  Result Date: 11/16/2017 CLINICAL DATA:  Follow-up osseous metastasis. History of breast cancer. EXAM: MRI THORACIC WITHOUT AND WITH CONTRAST TECHNIQUE: Multiplanar and multiecho pulse sequences of the thoracic spine were obtained without and with intravenous contrast. CONTRAST:  6 cc Gadavist COMPARISON:  CT chest November 15, 2017 FINDINGS: ALIGNMENT: Maintenance of the thoracic kyphosis. No malalignment. VERTEBRAE/DISCS: Diffusely low signal with heterogeneous enhancement T10 vertebral body and posterior elements consistent with metastatic disease. Smaller MR characteristics smaller lesions in T9 and T8. Small metastasis within the posterior  T5 and T7 vertebral bodies. Metastasis within the LEFT T12 pedicle and facet and LEFT L1 posterior elements. Known pathologic fractures T10 pedicles less conspicuous by MR. Generally bright T1 and T2 bone marrow signal T4 consistent with hemangioma. Intervertebral disc morphology generally maintained with mild disc desiccation. CORD: Enhancing tumor within the ventral epidural space from T8-9 to T10-11 (5.7 cm craniocaudad, 5 mm AP). Contiguous tumor within the dorsal epidural space at T9 through T11. Mild spinal cord deformity at T10 due to canal narrowing. No spinal cord edema or syrinx. Conus medullaris terminates at L1-2. No abnormal cord or leptomeningeal enhancement. PREVERTEBRAL AND PARASPINAL SOFT TISSUES:  Small amount of tumoral extension into prevertebral fat at T10. Small pleural effusions. Multiple hepatic metastasis better characterized on today's MRI abdomen. DISC LEVELS: Epidural tumor resulting in moderate to severe canal stenosis T10. Tumoral expansion of pedicles resulting in mild neural foraminal narrowing T9-10 and T10-11. IMPRESSION: 1. Multiple osseous metastasis (T5, T7, T8, T9, T10, T12 and L1), known T10 pathologic fracture better seen on yesterday's CT. 2. Epidural tumoral extension from T8-9 to T10-11 resulting in moderate to severe canal stenosis at T10, no spinal cord edema. Electronically Signed   By: Elon Alas M.D.   On: 11/16/2017 22:06   Dg Ercp With Sphincterotomy  Result Date: 11/17/2017 CLINICAL DATA:  Obstructive jaundice EXAM: ERCP TECHNIQUE: Multiple spot images obtained with the fluoroscopic device and submitted for interpretation post-procedure. COMPARISON:  MRCP 11/16/2017 and previous FINDINGS: Series of fluoroscopic spot images document endoscopic catheterization and opacification of the biliary tree. Diffuse narrowing of the mid and distal CBD, and at the confluence of the biliary ducts. Passage of a balloon catheter through the CBD, and subsequent placement  of metallic stent across the CBD, and an overlapping stent into the left lobe duct. No extravasation identified. IMPRESSION: 1. Endoscopic CBD catheterization and intervention including stenting of left and common bile ducts. These images were submitted for radiologic interpretation only. Please see the procedural report for the amount of contrast and the fluoroscopy time utilized. Electronically Signed   By: Lucrezia Europe M.D.   On: 11/17/2017 13:58    Assessment: 57 y.o. with history of breast cancer 20 years ago, otherwise healthy and active, presented with abdominal and back pain for 3 weeks, jaundice for 4 days.  1. 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 Obstructive jaundice, s/p ERCP and stent placement in CBD and left hepatic duct  4. Mild anemia   Plan:  -her tbil is trending dow, clinically stable, pain controlled -continue palliative radiation -GI Dr. Ethlyn Daniels may do EUS as outpt, will monitor her bilirubin level, to see if she needs additional bile duct stenting or percutaneous drainage -OK to discharge home from oncology stand point after RT tomorrow  -I discussed port placement with pt, she is agreeable, but prefer to do it later, will arrange in the next 1-2 weeks as outpt procedure  -I will see her back in my clinic next Tuesday  -I spoke with her daughters [name] and Gilford Rile at bedside, and answered all their questions  -plan to start systemic chemo in about 2 weeks if her liver functions recover well  -I have requested her biopsy to be tested for FO    Truitt Merle, MD 11/18/2017  6:18 PM

## 2017-11-18 NOTE — Progress Notes (Signed)
PROGRESS NOTE    Caitlin Pollard  AST:[id number] DOB: October 15, 1960 DOA: 11/15/2017 PCP: Chesley Noon, MD    Brief Narrative:  57 year old female who presented with jaundice.  She does have significant past medical history of breast cancer, hypertension and Inflectra bowel disease.  Currently diagnosed with hepatic lesions, suspicion for metastasis, underwent biopsy.  Postprocedure had significant abdominal pain, associated with weakness and jaundice.  On the initial physical examination temperature 98.7, blood pressure 160/110, heart rate 99, respiratory rate 20, oxygen saturation 95%.  Positive jaundice, moist mucous membranes, lungs clear to auscultation bilaterally, heart S1-S2 present rhythmic, abdomen with mild tenderness, no lower extremity edema.   Patient was admitted to the hospital working diagnosis of obstructive jaundice.   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 Obstructive jaundice. Today patient tolerating full liquid diet, will decrease rate of IV fluids to 50 ml per hour. Continue pain control with IV ketorolac and po oxycodone. Continue antiacids and as needed antiemetics. Will have patient out of bed and ambulating, physical therapy and nutrition consult. Continue bowel regimen with miralax and dulcolax. Total bilirubin at 9,9, AST 153, ALT 130, ALK P 1,066 from 1,128. Follow LFt in am.   2. HTN. Blood pressure has remained stable with systolic 989 mmHg.   3. Tobacco abuse. Smoking cessation.   4. History of breast cancer. Follow up with Oncology as outpatient.  5. Depression and anxiety. Continue fluoxetine and as needed lorazepam.    DVT prophylaxis: enoxaparin   Code Status: full Family  Communication: I spoke with patient's family at the bedside and all questions were addressed.  Disposition Plan/ discharge barriers: pending clinical improvement    Consultants:   GI   Procedures:     Antimicrobials:      Subjective: Patient continue to be very weak and deconditioned, tolerated well full liquid diet this am, no nausea or vomiting. No chest pain or dyspnea.   Objective: Vitals:   11/17/17 1549 11/17/17 1737 11/17/17 2112 11/18/17 0502  BP: (!) 168/101 (!) 162/94 (!) 145/100 120/78  Pulse: 80 73 69 80  Resp: _0 Temp:   (!) 97.5 F (36.4 C) 98.2 F (36.8 C)  TempSrc:   Oral Oral  SpO2: 96%  92% 97%  Weight:      Height:        Intake/Output Summary (Last 24 hours) at 11/18/2017 0938 Last data filed at 11/18/2017 0600 Gross per 24 hour  Intake 3043.66 ml  Output -  Net 3043.66 ml   Filed Weights   11/15/17 1547 11/16/17 0005  Weight: 63.1 kg 63 kg    Examination:   General: deconditioned and ill looking appearing  Neurology: Awake and alert, non focal  E ENT: positive pallor, no icterus, oral mucosa moist Cardiovascular: No JVD. S1-S2 present, rhythmic, no gallops, rubs, or murmurs. No lower extremity edema. Pulmonary:  Decreased breath sounds bilaterally, adequate air movement, no wheezing, rhonchi or rales. Gastrointestinal. Abdomen mild distention, with no organomegaly, mild tender to deep palpation, no rebound or guarding Skin. No rashes Musculoskeletal: no joint deformities     Data Reviewed: I have personally reviewed following labs and imaging studies  CBC: Recent Labs  Lab  11/11/17 1543 11/15/17 1113 11/15/17 1706 11/16/17 0518 11/18/17 0353  WBC 17.6* 15.2* 13.4* 12.0* 15.2*  NEUTROABS 14.8*  --  10.8*  --  13.1*  HGB 12.8 12.8 11.7* 10.3* 11.5*  HCT 37.8 38.0 34.6* 31.1* 33.6*  MCV 96.4 95.2 94.5 96.3 93.1  PLT 305 263 258 200 006   Basic Metabolic Panel: Recent Labs  Lab 11/11/17 1543 11/15/17 1706  11/16/17 0518 11/18/17 0353  NA 133* 130* 132* 132*  K 4.5 3.9 3.7 3.7  CL 92* 98 104 99  CO2 _0 GLUCOSE 109* 119* 127* 157*  BUN _1 CREATININE 0.72 0.61 0.34* 0.31*  CALCIUM 10.1 8.7* 8.4* 8.3*   GFR: Estimated Creatinine Clearance: 74.5 mL/min (A) (by C-G formula based on SCr of 0.31 mg/dL (L)). Liver Function Tests: Recent Labs  Lab 11/11/17 1543 11/15/17 1706 11/16/17 0518 11/18/17 0353  AST 315* 152* 141* 153*  ALT 260* 167* 143* 130*  ALKPHOS 1,553* 1,233* 1,128* 1,066*  BILITOT 3.0* 11.1* 10.9* 9.9*  PROT 7.3 6.6 5.4* 5.2*  ALBUMIN 3.3* 3.1* 2.4* 2.1*   Recent Labs  Lab 11/15/17 1706  LIPASE 36   Recent Labs  Lab 11/15/17 1706  AMMONIA 65*   Coagulation Profile: Recent Labs  Lab 11/15/17 1113 11/15/17 1706  INR 0.91 0.95   Cardiac Enzymes: No results for input(s): CKTOTAL, CKMB, CKMBINDEX, TROPONINI in the last 168 hours. BNP (last 3 results) No results for input(s): PROBNP in the last 8760 hours. HbA1C: No results for input(s): HGBA1C in the last 72 hours. CBG: No results for input(s): GLUCAP in the last 168 hours. Lipid Profile: No results for input(s): CHOL, HDL, LDLCALC, TRIG, CHOLHDL, LDLDIRECT in the last 72 hours. Thyroid Function Tests: No results for input(s): TSH, T4TOTAL, FREET4, T3FREE, THYROIDAB in the last 72 hours. Anemia Panel: No results for input(s): VITAMINB12, FOLATE, FERRITIN, TIBC, IRON, RETICCTPCT in the last 72 hours.    Radiology Studies: I have reviewed all of the imaging during this hospital visit personally     Scheduled Meds: . calcium-vitamin D  2 tablet Oral Daily  . cholecalciferol  2,000 Units Oral Daily  . FLUoxetine  10 mg Oral Daily  . indomethacin  100 mg Rectal Once  . Influenza vac split quadrivalent PF  0.5 mL Intramuscular Tomorrow-1000  . LORazepam  0.5 mg Oral BID  . nicotine  21 mg Transdermal Daily  . polyethylene glycol  17 g Oral Daily   Continuous Infusions: .  dextrose 5 % and 0.9% NaCl 100 mL/hr at 11/18/17 0058     LOS: 3 days        Sarvesh Meddaugh Gerome Apley, MD [facility] Pager [phone]

## 2017-11-19 ENCOUNTER — Inpatient Hospital Stay: Payer: BLUE CROSS/BLUE SHIELD | Admitting: Oncology

## 2017-11-19 ENCOUNTER — Ambulatory Visit
Admit: 2017-11-19 | Discharge: 2017-11-19 | Disposition: A | Discharge status: Home / Self Care | Payer: BLUE CROSS/BLUE SHIELD | Attending: Radiation Oncology | Admitting: Radiation Oncology

## 2017-11-19 ENCOUNTER — Inpatient Hospital Stay: Payer: BLUE CROSS/BLUE SHIELD | Admitting: Hematology

## 2017-11-19 LAB — CBC WITH DIFFERENTIAL/PLATELET
ABS IMMATURE GRANULOCYTES: 0.15 10*3/uL — AB (ref 0.00–0.07)
BASOS ABS: 0.1 10*3/uL (ref 0.0–0.1)
Basophils Relative: 0 %
EOS PCT: 1 %
Eosinophils Absolute: 0.1 10*3/uL (ref 0.0–0.5)
HEMATOCRIT: 26.1 % — AB (ref 36.0–46.0)
Hemoglobin: 9 g/dL — ABNORMAL LOW (ref 12.0–15.0)
IMMATURE GRANULOCYTES: 1 %
LYMPHS ABS: 0.5 10*3/uL — AB (ref 0.7–4.0)
LYMPHS PCT: 4 %
MCH: 32.3 pg (ref 26.0–34.0)
MCHC: 34.5 g/dL (ref 30.0–36.0)
MCV: 93.5 fL (ref 80.0–100.0)
MONO ABS: 1.4 10*3/uL — AB (ref 0.1–1.0)
Monocytes Relative: 11 %
NEUTROS ABS: 10.3 10*3/uL — AB (ref 1.7–7.7)
Neutrophils Relative %: 83 %
PLATELETS: 221 10*3/uL (ref 150–400)
RBC: 2.79 MIL/uL — ABNORMAL LOW (ref 3.87–5.11)
RDW: 13.9 % (ref 11.5–15.5)
WBC: 12.4 10*3/uL — AB (ref 4.0–10.5)
nRBC: 0 % (ref 0.0–0.2)

## 2017-11-19 LAB — HEPATIC FUNCTION PANEL
ALBUMIN: 2.2 g/dL — AB (ref 3.5–5.0)
ALT: 95 U/L — ABNORMAL HIGH (ref 0–44)
AST: 108 U/L — ABNORMAL HIGH (ref 15–41)
Alkaline Phosphatase: 912 U/L — ABNORMAL HIGH (ref 38–126)
Bilirubin, Direct: 4.2 mg/dL — ABNORMAL HIGH (ref 0.0–0.2)
Indirect Bilirubin: 3.1 mg/dL — ABNORMAL HIGH (ref 0.3–0.9)
TOTAL PROTEIN: 5 g/dL — AB (ref 6.5–8.1)
Total Bilirubin: 7.3 mg/dL — ABNORMAL HIGH (ref 0.3–1.2)

## 2017-11-19 LAB — BASIC METABOLIC PANEL
Anion gap: 10 (ref 5–15)
BUN: 11 mg/dL (ref 6–20)
CHLORIDE: 95 mmol/L — AB (ref 98–111)
CO2: 24 mmol/L (ref 22–32)
CREATININE: 0.44 mg/dL (ref 0.44–1.00)
Calcium: 8 mg/dL — ABNORMAL LOW (ref 8.9–10.3)
Glucose, Bld: 98 mg/dL (ref 70–99)
Potassium: 3.4 mmol/L — ABNORMAL LOW (ref 3.5–5.1)
SODIUM: 129 mmol/L — AB (ref 135–145)

## 2017-11-19 MED ORDER — PANTOPRAZOLE SODIUM 40 MG PO TBEC: 40.0000 mg | DELAYED_RELEASE_TABLET | Freq: Every day | ORAL | 0 refills | Status: AC

## 2017-11-19 MED ORDER — POLYETHYLENE GLYCOL 3350 17 G PO PACK: 17.0000 g | PACK | Freq: Every day | ORAL | 0 refills | Status: AC

## 2017-11-19 MED ORDER — OXYCODONE HCL 5 MG PO TABS: 5.0000 mg | ORAL_TABLET | ORAL | 0 refills | Status: DC | PRN

## 2017-11-19 MED ORDER — IBUPROFEN 400 MG PO TABS: 400.0000 mg | ORAL_TABLET | Freq: Four times a day (QID) | ORAL | 0 refills | Status: AC | PRN

## 2017-11-19 MED ORDER — PROMETHAZINE HCL 25 MG PO TABS: 25.0000 mg | ORAL_TABLET | Freq: Four times a day (QID) | ORAL | 0 refills | Status: AC | PRN

## 2017-11-19 NOTE — Care Management Note (Signed)
Case Management Note  Patient Details  Name: GERLENE GLASSBURN MRN: [id number] Date of Birth: 1960-03-22  Subjective/Objective:                  Discharged to home with self care  Action/Plan: Discharge to home with self care, orders checked for hhc needs. No CM needs present at time of discharge.  Expected Discharge Date:  11/19/17               Expected Discharge Plan:  Home/Self Care  In-House Referral:     Discharge planning Services  CM Consult  Post Acute Care Choice:    Choice offered to:     DME Arranged:    DME Agency:     HH Arranged:    HH Agency:     Status of Service:  Completed, signed off  If discussed at [employer] of Stay Meetings, dates discussed:    Additional Comments:  Leeroy Cha, RN 11/19/2017, 1:00 PM

## 2017-11-19 NOTE — Progress Notes (Signed)
PT Cancellation Note  Patient Details Name: Caitlin Pollard MRN: [id number] DOB: 09/29/1960   Cancelled Treatment:    Reason Eval/Treat Not Completed: Attempted PT eval-pt out of room for radiation. Spoke with husband who stated he did not think pt needed PT eval. Will check back as schedule permits. Husband stated he is hoping to take pt home on today.   Weston Anna, PT Acute Rehabilitation Services Pager: [phone] Office: [phone]

## 2017-11-19 NOTE — Progress Notes (Signed)
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       Malignant neoplasm of overlapping sites of right breast in female, estrogen receptor positive ([place])   1999 Surgery    status post right mastectomy 1999 with immediate reconstruction, total 9 right axillary lymph nodes removed     04/1998 - 12/2008 Anti-estrogen oral therapy    status post tamoxifen April 2000 through December 2009, then letrozole for 1 year    2000 -  Chemotherapy    status post cyclophosphamide and doxorubicin x4 adjuvantly    11/11/2017 Initial Diagnosis    Malignant neoplasm of overlapping sites of right breast in female, estrogen receptor positive ([place]) - pT1b pN1, 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 Estrogen Receptor: 0%, NEGATIVE Progesterone Receptor: 0%, NEGATIVE Proliferation Marker Ki67: 10%    11/15/2017 Pathology Results    Diagnosis Liver, needle/core biopsy - ADENOCARCINOMA, SEE COMMENT    11/15/2017 Imaging    11/15/2017 CT CAP IMPRESSION: 1. Hepatic metastatic disease with innumerable liver lesions, biopsied earlier today. No evidence of post biopsy hemorrhage or complication. 2. Intrahepatic and proximal extrahepatic biliary ductal dilatation, abrupt transition  proximally in the common duct with associated soft tissue density, which may represent adenopathy but is not well delineated. Ill-defined soft tissue density in the porta hepatis and portacaval region is likely adenopathy. This is contiguous with the uncinate process of the pancreas, focal pancreatic lesion difficult to exclude. No pancreatic ductal dilatation. 3. Attenuated portal vein at the portal splenic confluence, possibly occluded with suspected portal collaterals. Portal vein is otherwise patent. 4. Osseous metastatic disease with primarily sclerotic lesions, primarily involving the spine. Lesions involve T8, T9, T10, T11, and L1. Indeterminate lesion at T4 versus hemangioma. T10 lesion involves the entire vertebral body, with heterogeneous appearance of the lamina and possible extraosseous component and mass-effect on the spinal canal. Recommend thoracic spine MRI to evaluate for cord compression. 5. Nonspecific small pulmonary nodules, largest measuring 4 mm in the left upper lobe. 6. Small amount of perihepatic and pelvic ascites. 7. Aortic Atherosclerosis (ICD10-I70.0). Coronary artery calcifications.     11/15/2017 Imaging     11/15/2017 US Liver IMPRESSION: Ultrasound-guided core biopsy performed of 2 adjacent solid hypoechoic lesions within the left lobe of the liver. Innumerable lesions are seen throughout the liver parenchyma as well as evidence of biliary obstruction with dilated intrahepatic ducts, particularly in the left lobe. Recommend eventual correlation with CT of the chest, abdomen and pelvis for further staging.    11/16/2017 Imaging    11/16/2017 MRI Thoracic Spine IMPRESSION: 1. Multiple osseous metastasis (T5, T7, T8, T9, T10, T12 and L1), known T10 pathologic fracture better seen on yesterday's CT. 2. Epidural tumoral extension from T8-9 to T10-11 resulting in moderate to severe canal stenosis at T10, no spinal cord edema.    11/16/2017 Imaging     11/16/2017  MRI Abdomen IMPRESSION: Diffuse liver metastases.  Diffuse biliary ductal dilatation due to 2.1 cm soft tissue mass in the porta hepatis and involving the superior margin of the pancreatic head. Differential diagnosis includes primary pancreatic carcinoma, cholangiocarcinoma, and metastatic lymphadenopathy. Consider ERCP for further evaluation.     CURRENT THERAPY  Pending first line chemo with cisplatin and gemcitabine, 2 weeks on, 1 week off.    INTERVAL HISTORY: Caitlin Pollard is a 57 y.o. female who is here for follow-up. She was previously Dr. Virgie Dad patient. She was noted to have back and rib pain, right upper back pain with deep breaths, and occasional vomiting during her last visit with Dr. Jana Hakim. She was hospitalized on 11/15/2017 for jaundice and dark urine. 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 She was later discharged on 11/19/2017. I met her in the hospital.   Today, she is here with her family members. She is feeling better today, but states that she is not sleeping well. She takes oxycodone for the back pain she is having. Her pain was 8/10 a while ago and she had difficulties moving her back. She also noticed that she has become bloated recently, but feels better today. Her abdomen is distended, but she denies nausea. She takes laxatives as needed for constipation. She noticed urinary frequency lately, ut denies burning or discomfort.   REVIEW OF SYSTEMS:   Constitutional: Denies fevers, chills or abnormal weight loss Eyes: Denies blurriness of vision Ears, nose, mouth, throat, and face: Denies mucositis or sore throat Respiratory: Denies cough, dyspnea or wheezes Cardiovascular: Denies palpitation, chest discomfort or lower extremity swelling Gastrointestinal:  Denies nausea, heartburn (+) constipation (+) abdominal pain and distention (+) bloating GU: (+)  urinary frequency  Skin: Denies abnormal skin rashes Lymphatics: Denies new lymphadenopathy or easy bruising Neurological:Denies numbness, tingling or new weaknesses MSK: (+) back pain with ROM restriction  Behavioral/Psych: Mood is stable, no new changes  All other systems were reviewed with the patient and are negative.  MEDICAL HISTORY:  Past Medical History:  Diagnosis Date  . Breast cancer ([place])   . Breast cancer, stage 2 ([place]) 04/05/2011  . Family history of lung cancer   . HTN (hypertension), benign 04/05/2011  . Hyperglycemia without ketosis 04/05/2011  . IBD (inflammatory bowel disease) 04/05/2011    SURGICAL HISTORY: Past Surgical History:  Procedure Laterality Date  . ABDOMINAL HYSTERECTOMY    . AUGMENTATION MAMMAPLASTY Bilateral   . BILIARY STENT PLACEMENT  11/17/2017   Procedure: BILIARY STENT PLACEMENT;  Surgeon: Arta Silence, MD;  Location: [facility] ENDOSCOPY;  Service: Endoscopy;;  . ERCP N/A 11/17/2017   Procedure: ENDOSCOPIC RETROGRADE CHOLANGIOPANCREATOGRAPHY (ERCP);  Surgeon: Arta Silence, MD;  Location: [facility] ENDOSCOPY;  Service: Endoscopy;  Laterality: N/A;  . KNEE SURGERY    . MASTECTOMY Right   . SPHINCTEROTOMY  11/17/2017   Procedure: SPHINCTEROTOMY;  Surgeon: Arta Silence, MD;  Location: [facility] ENDOSCOPY;  Service: Endoscopy;;  . WRIST SURGERY      I have reviewed the social history and family history with the patient and they are unchanged from previous note.  ALLERGIES:  has No Known Allergies.  MEDICATIONS:  Current Outpatient Medications  Medication Sig Dispense Refill  . Calcium Carb-Cholecalciferol (CALCIUM 1000 + D PO) Take 1 tablet by mouth daily.    . cholecalciferol (VITAMIN D) 1000 UNITS tablet Take 2,000 Units by mouth daily.    Marland Kitchen FLUoxetine (PROZAC) 10 MG  capsule Take 10 mg by mouth daily.     Marland Kitchen ibuprofen (ADVIL,MOTRIN) 400 MG tablet Take 1 tablet (400 mg total) by mouth every 6 (six) hours as needed (back pain). 30 tablet 0  . LORazepam  (ATIVAN) 0.5 MG tablet Take 1 tablet (0.5 mg total) by mouth 2 (two) times daily. 30 tablet 0  . oxyCODONE (OXY IR/ROXICODONE) 5 MG immediate release tablet Take 1 tablet (5 mg total) by mouth every 4 (four) hours as needed for moderate pain. 60 tablet 0  . pantoprazole (PROTONIX) 40 MG tablet Take 1 tablet (40 mg total) by mouth daily. 30 tablet 0  . polyethylene glycol (MIRALAX / GLYCOLAX) packet Take 17 g by mouth daily. 14 each 0  . promethazine (PHENERGAN) 25 MG tablet Take 1 tablet (25 mg total) by mouth every 6 (six) hours as needed for nausea or vomiting. 20 tablet 0  . morphine (MS CONTIN) 15 MG 12 hr tablet Take 1 tablet (15 mg total) by mouth every 12 (twelve) hours. 60 tablet 0   No current facility-administered medications for this visit.     PHYSICAL EXAMINATION: ECOG PERFORMANCE STATUS: 2 - Symptomatic, <50% confined to bed  Vitals:   11/23/17 1325  BP: (!) 148/98  Pulse: 92  Resp: 20  Temp: 97.9 F (36.6 C)  SpO2: 100%   Filed Weights   11/23/17 1325  Weight: 149 lb 3.2 oz (67.7 kg)    GENERAL:alert, no distress and comfortable SKIN: skin color, texture, turgor are normal, no rashes or significant lesions (+) mild jaundice  EYES: normal, Conjunctiva are pink and non-injected, sclera clear OROPHARYNX:no exudate, no erythema and lips, buccal mucosa, and tongue normal  NECK: supple, thyroid normal size, non-tender, without nodularity LYMPH:  no palpable lymphadenopathy in the cervical, axillary or inguinal LUNGS: clear to auscultation and percussion with normal breathing effort HEART: regular rate & rhythm and no murmurs and no lower extremity edema ABDOMEN:abdomen soft, non-tender and normal bowel sounds (+) abdomen distended  Musculoskeletal:no cyanosis of digits and no clubbing (+) back pain NEURO: alert & oriented x 3 with fluent speech, no focal motor/sensory deficits  LABORATORY DATA:  I have reviewed the data as listed CBC Latest Ref Rng & Units 11/23/2017  11/19/2017 11/18/2017  WBC 4.0 - 10.5 K/uL 11.7(H) 12.4(H) 15.2(H)  Hemoglobin 12.0 - 15.0 g/dL 9.0(L) 9.0(L) 11.5(L)  Hematocrit 36.0 - 46.0 % 26.6(L) 26.1(L) 33.6(L)  Platelets 150 - 400 K/uL 364 221 293     CMP Latest Ref Rng & Units 11/23/2017 11/19/2017 11/18/2017  Glucose 70 - 99 mg/dL 111(H) 98 157(H)  BUN 6 - 20 mg/dL _0 Creatinine 0.44 - 1.00 mg/dL 0.60 0.44 0.31(L)  Sodium 135 - 145 mmol/L 132(L) 129(L) 132(L)  Potassium 3.5 - 5.1 mmol/L 3.8 3.4(L) 3.7  Chloride 98 - 111 mmol/L 95(L) 95(L) 99  CO2 22 - 32 mmol/L _1 Calcium 8.9 - 10.3 mg/dL 8.9 8.0(L) 8.3(L)  Total Protein 6.5 - 8.1 g/dL 5.9(L) 5.0(L) 5.2(L)  Total Bilirubin 0.3 - 1.2 mg/dL 4.3(HH) 7.3(H) 9.9(H)  Alkaline Phos 38 - 126 U/L 1,330(H) 912(H) 1,066(H)  AST 15 - 41 U/L 95(H) 108(H) 153(H)  ALT 0 - 44 U/L 66(H) 95(H) 130(H)    PATHOLOGY 11/15/2017 Surgical Pathology ADDITIONAL INFORMATION: PROGNOSTIC INDICATORS Results: IMMUNOHISTOCHEMICAL AND MORPHOMETRIC ANALYSIS PERFORMED MANUALLY The tumor cells are negative for Her2 (1+). Estrogen Receptor: 0%, NEGATIVE Progesterone Receptor: 0%, NEGATIVE Proliferation Marker Ki67: 10% COMMENT: The negative hormone receptor study(ies) in  this case has no internal positive control. REFERENCE RANGE ESTROGEN RECEPTOR NEGATIVE 0% POSITIVE =>1% REFERENCE RANGE PROGESTERONE RECEPTOR NEGATIVE 0% POSITIVE =>1% All controls stained appropriately Diagnosis Liver, needle/core biopsy - ADENOCARCINOMA, SEE COMMENT. Microscopic Comment Immunohistochemistry for CK7 is strong and diffusely positive. CK20, TTF-1, CDX-2, GATA3, PAX 8 and Qualitative ER are negative. The limited immunophenotype of CK7 positivity raises a broad differential, including origin from such entities as lung, breast, upper gastrointestinal tract, pancreaticobiliary tract and gynecologic tract. Case discussed with Worthy Flank for Dr. Lurline Del on 11/17/2017. Intradepartmental  consultation was obtained (Dr. Lyndon Code). Gillie Manners MD Pathologist, Electronic Signature (Case signed 11/17/2017) Specimen Gross and Clinical Information Specimen(s) Obtained: Liver, needle/core biopsy Specimen Clinical Information Metastatic carcinoma; breast versus other primary [rd] Gross The specimen is received in formalin and consists of four cores of tan to white soft tissue, ranging from 1.6 to 1.8 cm in length x 0.1 cm in diameter. The specimen is entirely submitted in two cassettes. Craig Staggers: ah 11/15/17) Stain(s) used in Diagnosis: The following stain(s) were used in diagnosing the case: KI-67-ACIS, Her2 by IHC, CK 20, PAX 8, ER - NOACIS, ER-ACIS, CDX-2, Thyroid Transcription Factor -1, PR-ACIS, GATA-3, CK-7. The control(s) stained appropriately.    RADIOGRAPHIC STUDIES: I have personally reviewed the radiological images as listed and agreed with the findings in the report.  11/16/2017 MRI Thoracic Spine IMPRESSION: 1. Multiple osseous metastasis (T5, T7, T8, T9, T10, T12 and L1), known T10 pathologic fracture better seen on yesterday's CT. 2. Epidural tumoral extension from T8-9 to T10-11 resulting in moderate to severe canal stenosis at T10, no spinal cord edema.   11/16/2017 MRI Abdomen IMPRESSION: Diffuse liver metastases.  Diffuse biliary ductal dilatation due to 2.1 cm soft tissue mass in the porta hepatis and involving the superior margin of the pancreatic head. Differential diagnosis includes primary pancreatic carcinoma, cholangiocarcinoma, and metastatic lymphadenopathy. Consider ERCP for further evaluation.   11/15/2017 CT CAP IMPRESSION: 1. Hepatic metastatic disease with innumerable liver lesions, biopsied earlier today. No evidence of post biopsy hemorrhage or complication. 2. Intrahepatic and proximal extrahepatic biliary ductal dilatation, abrupt transition proximally in the common duct with associated soft tissue density, which may represent  adenopathy but is not well delineated. Ill-defined soft tissue density in the porta hepatis and portacaval region is likely adenopathy. This is contiguous with the uncinate process of the pancreas, focal pancreatic lesion difficult to exclude. No pancreatic ductal dilatation. 3. Attenuated portal vein at the portal splenic confluence, possibly occluded with suspected portal collaterals. Portal vein is otherwise patent. 4. Osseous metastatic disease with primarily sclerotic lesions, primarily involving the spine. Lesions involve T8, T9, T10, T11, and L1. Indeterminate lesion at T4 versus hemangioma. T10 lesion involves the entire vertebral body, with heterogeneous appearance of the lamina and possible extraosseous component and mass-effect on the spinal canal. Recommend thoracic spine MRI to evaluate for cord compression. 5. Nonspecific small pulmonary nodules, largest measuring 4 mm in the left upper lobe. 6. Small amount of perihepatic and pelvic ascites. 7. Aortic Atherosclerosis (ICD10-I70.0). Coronary artery calcifications.    11/15/2017 US Liver IMPRESSION: Ultrasound-guided core biopsy performed of 2 adjacent solid hypoechoic lesions within the left lobe of the liver. Innumerable lesions are seen throughout the liver parenchyma as well as evidence of biliary obstruction with dilated intrahepatic ducts, particularly in the left lobe. Recommend eventual correlation with CT of the chest, abdomen and pelvis for further staging.  No results found.   ASSESSMENT & PLAN:  Caitlin Pollard is a  57 y.o. female with history of  1. 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  Liver biopsy showed adenocarcinoma, consistent with pancreatic biliary origin.  Based on the location of her biliary  obstruction, this is likely cholangiocarcinoma, less likely pancreatic primary.   -I discussed with Dr. Theresa Duty last week, to see if we can proceed with the EUS to rule out primary pancreatic cancer -she has started palliative radiation for her back pain, she is tolerating well, will complete next Wednesday  -I recommend her to start systemic chemo with first line cisplatin and gemcitabine on day 1, 8 every 21 days  --Chemotherapy consent: Side effects including but does not not limited to, fatigue, nausea, vomiting, diarrhea, hair loss, neuropathy, fluid retention, renal and kidney dysfunction, neutropenic fever, needed for blood transfusion, bleeding, were discussed with patient in great detail. She agrees to proceed. I also discussed possibility of hearing loss with chemotherapy. She agrees to proceed -The goal of therapy is palliative, to prolong her life and improve her symptoms. -And to start chemo the week of 11/11 after her visit at [facility]  -She plans to go to [facility] on 12/06/2017 for a second opinion. They will also have a consultation at [facility] soon. -I will call in EMLA cream, and Zofran for her chemo, has Phenergan at home now  2. HTN -f/u with PCP  3. Depression and Anxiety -Will continue fluoxetine and as needed lorazepam.   4. 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 Urinary Frequency -She denies dysuria -I will order a urine test to r/u UTI  6. Goal of care discussion  -We again discussed the incurable nature of her cancer, and the  overall poor prognosis, especially if she does not have good response to chemotherapy or progress on chemo -The patient understands the goal of care is palliative. -she is full code now    PLAN -I refilled oxycodone and prescribed morphine ER 47m q12h  -Will order UA and urine culture today  -Port by IR next week -chemo class next week -will discuss with Dr. OPaulita Fujitaabout EUS  -Lab, flush, f/u with me or Lacie and chemo cisplatin and gemcitabine on 11/11 or 11/12 and one week after   No problem-specific Assessment & Plan notes found for this encounter.   Orders Placed This Encounter  Procedures  . Urine Culture    Standing Status:   Future    Number of Occurrences:   1    Standing Expiration Date:   11/23/2018  . IR Perc Tun Perit Cath W/Port    Standing Status:   Future    Standing Expiration Date:   01/24/2019    Order Specific Question:   Reason for exam:    Answer:   chemo    Order Specific Question:   Is the patient pregnant?    Answer:   No    Order  Specific Question:   Preferred Imaging Location?    Answer:   [facility]  . Urinalysis, Complete w Microscopic    Standing Status:   Future    Number of Occurrences:   1    Standing Expiration Date:   11/24/2018   All questions were answered. The patient knows to call the clinic with any problems, questions or concerns. No barriers to learning was detected.  I spent 35 minutes counseling the patient face to face. The total time spent in the appointment was 45 minutes and more than 50% was on counseling and review of test results  I, Noor Dweik am acting as scribe for Dr. Truitt Merle.  I have reviewed the above documentation for accuracy and completeness, and I agree with the above.      Truitt Merle, MD 11/23/2017

## 2017-11-19 NOTE — Discharge Summary (Addendum)
Physician Discharge Summary  Caitlin Pollard NWG:[id number] DOB: December 28, 1960 DOA: 11/15/2017  PCP: Chesley Noon, MD  Admit date: 11/15/2017 Discharge date: 11/19/2017  Admitted From: Home  Disposition:  Home   Recommendations for Outpatient Follow-up and new medication changes:  1. Follow up with Dr. Melford Aase in 7 days.  2. Patient has been placed on oxycodone and ibuprofen for pain control. 3. Continue soft diet and advance as tolerated 4. Antiacid therapy with pantoprazole. 5. As needed phenergan for nausea control.  6. Patient will continue palliative radiation, port placement as outpatient.  7. Follow up as outpatient with Dr Paulita Fujita, for possible EUS.  8. Follow up with Dr Burr Medico on Tuesday next week. 9. Holding losartan to prevent hypotension.    Home Health: no   Equipment/Devices: no    Discharge Condition: stable  CODE STATUS: full  Diet recommendation: Soft diet.   Brief/Interim Summary: 57 year old female who presented with jaundice. She does have the significant past medical history of breast cancer,hypertension and inflammatory bowel disease. She has been recently diagnosed with hepatic lesions, suspicion for metastasis, and underwent biopsy. Postprocedure had significant abdominal pain, associated with weakness and jaundice. On the initial physical examination temperature 98.7, blood pressure 160/110, heart rate 99, respiratory rate 20, oxygen saturation 95%.Positive jaundice, moist mucous membranes, lungs clear to auscultation bilaterally, heart S1-S2 present rhythmic, abdomen with mild tenderness, no lower extremity edema.   Sodium 130, potassium 3.9, chloride 98, bicarb 25, glucose 119, BUN 15, creatinine 0.61, alkaline phosphatase 1.233, lipase 36, AST 152, ALT 167, total bilirubin is 11.1, white count 13.4, hemoglobin 11.7, hematocrit 34.6, platelets 258.  Urinalysis 11-20 white cells, 0-5 red cells.    CT of the abdomen with hepatic metastatic disease with  innumerable liver lesions.  Intrahepatic and proximal extrahepatic biliary duct dilatation.  Osseous metastatic disease sclerotic lesions T8, T9, T10, T11, L1.  Thoracic MRI with multiple osseous metastasis T5, T7, T8, T9, T10, T12, L1.  T12 pathologic fracture.  Epidural tumor extension from T8/9 to T 10/11 resulting in moderate to severe canal stenosis at T10 no spinal cord edema.  EKG had normal sinus rhythm, normal axis, normal intervals.  Patient was admitted to the hospital working diagnosis of obstructive jaundice.  1.  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  She had innumerable metastatic liver lesions along with adenopathies. Patient underwent ERCP, with biliary sphincterectomy and an uncovered metal stent was placed into the left hepatic duct.  Her symptoms improved, and her discharge bilirubin is down to 7.3, AST 108, ALT 95, alkaline phosphatase 912.  Her diet was advanced progressively with good toleration.  Patient will follow-up with Dr. Paulita Fujita as an outpatient, follow-up on liver function testing, and possible endoscopic ultrasound.   Patient will have port placed as an outpatient, with plans to start palliative chemotherapy once significant improvement of liver function testing.  2.  Thoracic spine metastatic lesions.  Continue pain control with ibuprofen and oxycodone.  Continue palliative radiation therapy.  3.  Hypertension.  Patient remain off antihypertensives during hospitalization. Will plan to continue holding losartan to prevent hypotension.   4.  Tobacco abuse.  Smoking cessation.   5. Depression and anxiety. Will continue fluoxetine and as needed lorazepam.   6. 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 Stable hb and hct, no indication for prbc transfusion.    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 Denies this allergy      Medication List    STOP taking these medications   cyclobenzaprine 10 MG tablet Commonly known as:  FLEXERIL   losartan 100 MG tablet Commonly known as:  COZAAR     TAKE these medications   CALCIUM 1000 + D PO Take 1 tablet by mouth daily.   cholecalciferol 1000 units tablet Commonly known as:  VITAMIN D Take 2,000 Units by mouth daily.   FLUoxetine 10 MG capsule Commonly known as:  PROZAC Take 10 mg by mouth daily.   ibuprofen 400 MG tablet Commonly known as:  ADVIL,MOTRIN Take 1 tablet (400 mg total) by mouth every 6 (six) hours as needed (back pain). What changed:    medication strength  how much to take  reasons to take this   LORazepam 0.5 MG tablet Commonly known as:  ATIVAN Take 1 tablet (0.5 mg total) by mouth 2 (two) times daily.   oxyCODONE 5 MG immediate release tablet Commonly known as:  Oxy IR/ROXICODONE Take 1 tablet (5 mg total) by mouth every 4 (four) hours as needed for moderate pain.   pantoprazole 40 MG tablet Commonly known as:  PROTONIX Take 1 tablet (40 mg total) by mouth daily. Start taking on:  11/20/2017   polyethylene glycol packet Commonly known as:  MIRALAX / GLYCOLAX Take 17 g by mouth daily. Start taking on:  11/20/2017   promethazine 25 MG tablet Commonly known as:  PHENERGAN Take 1 tablet (25 mg total) by mouth every 6 (six) hours as needed for nausea or vomiting.     ASK your doctor about these medications   Influenza vac split  quadrivalent PF 0.5 ML injection Commonly known as:  FLUARIX Inject 0.5 mLs into the muscle tomorrow at 10 am for 1 dose. Ask about: Should I take this medication?      Follow-up Information    Call Clarene Essex, MD.   Specialty:  Gastroenterology Why:  As neededOr if question or problem Contact information: [address] [place] [address] [phone]          Allergies  Allergen Reactions  . Cholestatin Other (See Comments)    Runny eyes--pt. Denies this allergy    Consultations:  GI   Oncology   Radiation Oncology    Procedures/Studies: Ct Chest W Contrast  Result Date: 11/15/2017 CLINICAL DATA:  Liver lesions post ultrasound-guided biopsy today. Jaundice. Diffuse pain. Listed history of breast cancer in electronic medical record. EXAM: CT CHEST, ABDOMEN, AND PELVIS WITH CONTRAST TECHNIQUE: Multidetector CT imaging of the chest, abdomen and pelvis was performed following the standard protocol during bolus administration of intravenous contrast. CONTRAST:  112mL ISOVUE-300 IOPAMIDOL (ISOVUE-300) INJECTION 61% COMPARISON:  Ultrasound 11/11/2017 FINDINGS: CT CHEST FINDINGS Cardiovascular: Aortic atherosclerosis without aneurysm. The heart is normal in size. There are coronary artery calcifications. No pericardial fluid. Mediastinum/Nodes: No enlarged hilar nodes. 6 mm right epicardial node,  no other mediastinal adenopathy. Small lymph nodes in the left axilla largest measuring 7 mm. Surgical clips in the right axilla without right axillary adenopathy. Heterogeneous left lobe of the thyroid gland with multiple small nodules, largest measuring 10 mm. The esophagus is decompressed. Lungs/Pleura: Calcified granuloma in the right upper lobe series 6, image 36. Small noncalcified nodules including punctate right upper lobe nodule image 35, 4 mm left upper lobe nodule image 65, punctate subpleural right middle lobe nodule image 84, and 3 mm left lower lobe nodule  image 94. No confluent airspace disease. No pleural fluid. Musculoskeletal: Primarily sclerotic lesion of T10 with diffuse sclerosis of the the vertebral body, and moth-eaten appearance of both lamina, sclerosis of the transverse and spinous processes. Sclerotic lesions within posterior T9 vertebral body extending into the left lamina. Mixed lytic and sclerotic lesion posterior T8 vertebral body with likely extension to the left lamina. Sclerotic lesion posterior T11 vertebral body. Trabecular coarsening of T4 vertebral body may simply represent a hemangioma but is nonspecific. Bilateral breast implants. CT ABDOMEN PELVIS FINDINGS Hepatobiliary: Innumerable defined low-density lesions throughout the liver consistent with metastatic disease. Conglomerate lesion in the periphery measures approximately 6.8 x 10 cm with slight capsular retraction. Second dominant lesion in the right lobe measures 8.3 x 5.7 cm. There is intrahepatic biliary ductal dilatation. Dilatation of the common bile duct to 16 mm with abrupt transition, image 52 series 4 with associated soft tissue density. Gallbladder partially distended with diffuse gallbladder wall thickening. Small amount perihepatic ascites, measuring simple fluid density. No evidence of hepatic biopsy complication or hemorrhage. Pancreas: Soft tissue fullness in the uncinate process favored to represent adjacent adenopathy rather than focal pancreatic lesion, however difficult to delineate on CT. No ductal dilatation or inflammation. Spleen: Calcified granuloma. No cystic or solid lesion. Spleen is normal in size. Adrenals/Urinary Tract: Normal adrenal glands without adrenal nodule. No hydronephrosis or perinephric edema. Homogeneous renal enhancement with symmetric excretion on delayed phase imaging. Urinary bladder is physiologically distended without wall thickening. Stomach/Bowel: Lack of enteric contrast limits bowel assessment. No evidence bowel wall thickening,  inflammatory change or obstruction. No obvious focal bowel lesion. Moderate stool in the proximal colon. Normal appendix. Vascular/Lymphatic: Aortic atherosclerosis without aneurysm. Bi-iliac atherosclerosis. Portal vein is patent at the porta hepatis, attenuated at the porta splenic confluence, possible occluded with surrounding collaterals. Splenic vein is patent. Heterogeneous appearance of the superior mesenteric vein may be due to contrast mixing, no discrete thrombus. Suspected porta hepatis and portacaval adenopathy, with ill-defined soft tissue density the portal caval region, difficult to delineate from adjacent structures. Small retroperitoneal nodes. No pelvic adenopathy. Reproductive: Status post hysterectomy. No adnexal masses. Other: Small volume of perihepatic and pelvic ascites.  No free air. Musculoskeletal: Sclerotic lesion within L1 vertebral body extends into the left lamina. Scattered lumbar degenerative change. IMPRESSION: 1. Hepatic metastatic disease with innumerable liver lesions, biopsied earlier today. No evidence of post biopsy hemorrhage or complication. 2. Intrahepatic and proximal extrahepatic biliary ductal dilatation, abrupt transition proximally in the common duct with associated soft tissue density, which may represent adenopathy but is not well delineated. Ill-defined soft tissue density in the porta hepatis and portacaval region is likely adenopathy. This is contiguous with the uncinate process of the pancreas, focal pancreatic lesion difficult to exclude. No pancreatic ductal dilatation. 3. Attenuated portal vein at the portal splenic confluence, possibly occluded with suspected portal collaterals. Portal vein is otherwise patent. 4. Osseous metastatic disease with primarily sclerotic lesions, primarily involving the spine. Lesions  involve T8, T9, T10, T11, and L1. Indeterminate lesion at T4 versus hemangioma. T10 lesion involves the entire vertebral body, with heterogeneous  appearance of the lamina and possible extraosseous component and mass-effect on the spinal canal. Recommend thoracic spine MRI to evaluate for cord compression. 5. Nonspecific small pulmonary nodules, largest measuring 4 mm in the left upper lobe. 6. Small amount of perihepatic and pelvic ascites. 7. Aortic Atherosclerosis (ICD10-I70.0). Coronary artery calcifications. Electronically Signed   By: Keith Rake M.D.   On: 11/15/2017 22:09   Mr Thoracic Spine W Wo Contrast  Result Date: 11/16/2017 CLINICAL DATA:  Follow-up osseous metastasis. History of breast cancer. EXAM: MRI THORACIC WITHOUT AND WITH CONTRAST TECHNIQUE: Multiplanar and multiecho pulse sequences of the thoracic spine were obtained without and with intravenous contrast. CONTRAST:  6 cc Gadavist COMPARISON:  CT chest November 15, 2017 FINDINGS: ALIGNMENT: Maintenance of the thoracic kyphosis. No malalignment. VERTEBRAE/DISCS: Diffusely low signal with heterogeneous enhancement T10 vertebral body and posterior elements consistent with metastatic disease. Smaller MR characteristics smaller lesions in T9 and T8. Small metastasis within the posterior T5 and T7 vertebral bodies. Metastasis within the LEFT T12 pedicle and facet and LEFT L1 posterior elements. Known pathologic fractures T10 pedicles less conspicuous by MR. Generally bright T1 and T2 bone marrow signal T4 consistent with hemangioma. Intervertebral disc morphology generally maintained with mild disc desiccation. CORD: Enhancing tumor within the ventral epidural space from T8-9 to T10-11 (5.7 cm craniocaudad, 5 mm AP). Contiguous tumor within the dorsal epidural space at T9 through T11. Mild spinal cord deformity at T10 due to canal narrowing. No spinal cord edema or syrinx. Conus medullaris terminates at L1-2. No abnormal cord or leptomeningeal enhancement. PREVERTEBRAL AND PARASPINAL SOFT TISSUES: Small amount of tumoral extension into prevertebral fat at T10. Small pleural effusions.  Multiple hepatic metastasis better characterized on today's MRI abdomen. DISC LEVELS: Epidural tumor resulting in moderate to severe canal stenosis T10. Tumoral expansion of pedicles resulting in mild neural foraminal narrowing T9-10 and T10-11. IMPRESSION: 1. Multiple osseous metastasis (T5, T7, T8, T9, T10, T12 and L1), known T10 pathologic fracture better seen on yesterday's CT. 2. Epidural tumoral extension from T8-9 to T10-11 resulting in moderate to severe canal stenosis at T10, no spinal cord edema. Electronically Signed   By: Elon Alas M.D.   On: 11/16/2017 22:06   Ct Abdomen Pelvis W Contrast  Result Date: 11/15/2017 CLINICAL DATA:  Liver lesions post ultrasound-guided biopsy today. Jaundice. Diffuse pain. Listed history of breast cancer in electronic medical record. EXAM: CT CHEST, ABDOMEN, AND PELVIS WITH CONTRAST TECHNIQUE: Multidetector CT imaging of the chest, abdomen and pelvis was performed following the standard protocol during bolus administration of intravenous contrast. CONTRAST:  171mL ISOVUE-300 IOPAMIDOL (ISOVUE-300) INJECTION 61% COMPARISON:  Ultrasound 11/11/2017 FINDINGS: CT CHEST FINDINGS Cardiovascular: Aortic atherosclerosis without aneurysm. The heart is normal in size. There are coronary artery calcifications. No pericardial fluid. Mediastinum/Nodes: No enlarged hilar nodes. 6 mm right epicardial node, no other mediastinal adenopathy. Small lymph nodes in the left axilla largest measuring 7 mm. Surgical clips in the right axilla without right axillary adenopathy. Heterogeneous left lobe of the thyroid gland with multiple small nodules, largest measuring 10 mm. The esophagus is decompressed. Lungs/Pleura: Calcified granuloma in the right upper lobe series 6, image 36. Small noncalcified nodules including punctate right upper lobe nodule image 35, 4 mm left upper lobe nodule image 65, punctate subpleural right middle lobe nodule image 84, and 3 mm left lower lobe nodule  image  94. No confluent airspace disease. No pleural fluid. Musculoskeletal: Primarily sclerotic lesion of T10 with diffuse sclerosis of the the vertebral body, and moth-eaten appearance of both lamina, sclerosis of the transverse and spinous processes. Sclerotic lesions within posterior T9 vertebral body extending into the left lamina. Mixed lytic and sclerotic lesion posterior T8 vertebral body with likely extension to the left lamina. Sclerotic lesion posterior T11 vertebral body. Trabecular coarsening of T4 vertebral body may simply represent a hemangioma but is nonspecific. Bilateral breast implants. CT ABDOMEN PELVIS FINDINGS Hepatobiliary: Innumerable defined low-density lesions throughout the liver consistent with metastatic disease. Conglomerate lesion in the periphery measures approximately 6.8 x 10 cm with slight capsular retraction. Second dominant lesion in the right lobe measures 8.3 x 5.7 cm. There is intrahepatic biliary ductal dilatation. Dilatation of the common bile duct to 16 mm with abrupt transition, image 52 series 4 with associated soft tissue density. Gallbladder partially distended with diffuse gallbladder wall thickening. Small amount perihepatic ascites, measuring simple fluid density. No evidence of hepatic biopsy complication or hemorrhage. Pancreas: Soft tissue fullness in the uncinate process favored to represent adjacent adenopathy rather than focal pancreatic lesion, however difficult to delineate on CT. No ductal dilatation or inflammation. Spleen: Calcified granuloma. No cystic or solid lesion. Spleen is normal in size. Adrenals/Urinary Tract: Normal adrenal glands without adrenal nodule. No hydronephrosis or perinephric edema. Homogeneous renal enhancement with symmetric excretion on delayed phase imaging. Urinary bladder is physiologically distended without wall thickening. Stomach/Bowel: Lack of enteric contrast limits bowel assessment. No evidence bowel wall thickening,  inflammatory change or obstruction. No obvious focal bowel lesion. Moderate stool in the proximal colon. Normal appendix. Vascular/Lymphatic: Aortic atherosclerosis without aneurysm. Bi-iliac atherosclerosis. Portal vein is patent at the porta hepatis, attenuated at the porta splenic confluence, possible occluded with surrounding collaterals. Splenic vein is patent. Heterogeneous appearance of the superior mesenteric vein may be due to contrast mixing, no discrete thrombus. Suspected porta hepatis and portacaval adenopathy, with ill-defined soft tissue density the portal caval region, difficult to delineate from adjacent structures. Small retroperitoneal nodes. No pelvic adenopathy. Reproductive: Status post hysterectomy. No adnexal masses. Other: Small volume of perihepatic and pelvic ascites.  No free air. Musculoskeletal: Sclerotic lesion within L1 vertebral body extends into the left lamina. Scattered lumbar degenerative change. IMPRESSION: 1. Hepatic metastatic disease with innumerable liver lesions, biopsied earlier today. No evidence of post biopsy hemorrhage or complication. 2. Intrahepatic and proximal extrahepatic biliary ductal dilatation, abrupt transition proximally in the common duct with associated soft tissue density, which may represent adenopathy but is not well delineated. Ill-defined soft tissue density in the porta hepatis and portacaval region is likely adenopathy. This is contiguous with the uncinate process of the pancreas, focal pancreatic lesion difficult to exclude. No pancreatic ductal dilatation. 3. Attenuated portal vein at the portal splenic confluence, possibly occluded with suspected portal collaterals. Portal vein is otherwise patent. 4. Osseous metastatic disease with primarily sclerotic lesions, primarily involving the spine. Lesions involve T8, T9, T10, T11, and L1. Indeterminate lesion at T4 versus hemangioma. T10 lesion involves the entire vertebral body, with heterogeneous  appearance of the lamina and possible extraosseous component and mass-effect on the spinal canal. Recommend thoracic spine MRI to evaluate for cord compression. 5. Nonspecific small pulmonary nodules, largest measuring 4 mm in the left upper lobe. 6. Small amount of perihepatic and pelvic ascites. 7. Aortic Atherosclerosis (ICD10-I70.0). Coronary artery calcifications. Electronically Signed   By: Keith Rake M.D.   On: 11/15/2017 22:09   Mr  3d Recon At Scanner  Addendum Date: 11/16/2017   ADDENDUM REPORT: 11/16/2017 11:55 ADDENDUM: In addition, there are several bone lesions in the visualized lower thoracic and upper lumbar spine, consistent with bone metastases. Electronically Signed   By: Earle Gell M.D.   On: 11/16/2017 11:55   Result Date: 11/16/2017 CLINICAL DATA:  Jaundice. Abdominal pain. Personal history of breast carcinoma. EXAM: MRI ABDOMEN WITHOUT AND WITH CONTRAST (INCLUDING MRCP) TECHNIQUE: Multiplanar multisequence MR imaging of the abdomen was performed both before and after the administration of intravenous contrast. Heavily T2-weighted images of the biliary and pancreatic ducts were obtained, and three-dimensional MRCP images were rendered by post processing. CONTRAST:  6 mL Gadavist COMPARISON:  CT on 11/15/2017 FINDINGS: Lower chest: No acute findings. Hepatobiliary: Numerous hypovascular masses are seen throughout the liver, consistent with diffuse liver metastases. Largest index lesion is located in the posterior right hepatic lobe measuring 8.6 x 5.3 cm. Gallbladder is incompletely distended. Mild wall thickening is seen without pericholecystic inflammatory changes. This is nonspecific and likely due to hepatocellular disease. Marked diffuse biliary ductal dilatation is seen. 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 This mass is centered in the porta hepatis and abuts  the superior margin of the pancreatic head. Differential diagnosis includes pancreatic carcinoma, cholangiocarcinoma, and metastatic porta hepatis lymphadenopathy. Pancreas: 2.1 cm soft tissue mass in the porta hepatis abuts the superior margin of the pancreatic head. Pancreas is otherwise unremarkable in appearance and there is no evidence of pancreatic ductal dilatation. Spleen:  Within normal limits in size and appearance. Adrenals/Urinary Tract: No masses identified. No evidence of hydronephrosis. Stomach/Bowel: Visualized portion unremarkable. Vascular/Lymphatic: Soft-tissue mass in porta hepatis which is contiguous with the pancreatic head may represent lymphadenopathy versus pancreatic or common bile duct neoplasm. No other sites of lymphadenopathy identified. No abdominal aortic aneurysm. Other:  None. Musculoskeletal:  No suspicious bone lesions identified. IMPRESSION: Diffuse liver metastases. Diffuse biliary ductal dilatation due to 2.1 cm soft tissue mass in the porta hepatis and involving the superior margin of the pancreatic head. Differential diagnosis includes primary pancreatic carcinoma, cholangiocarcinoma, and metastatic lymphadenopathy. Consider ERCP for further evaluation. Electronically Signed: By: Earle Gell M.D. On: 11/16/2017 09:49   US Biopsy (liver)  Result Date: 11/15/2017 CLINICAL DATA:  Multiple liver lesions by prior ultrasound. The patient presents for biopsy. EXAM: ULTRASOUND GUIDED CORE BIOPSY OF LIVER COMPARISON:  Prior abdominal ultrasound at [place] on 11/11/2017 MEDICATIONS: 2.0 mg IV Versed; 100 mcg IV Fentanyl Total Moderate Sedation Time: 20 minutes. The patient's level of consciousness and physiologic status were continuously monitored during the procedure by Radiology nursing. PROCEDURE: The procedure, risks, benefits, and alternatives were explained to the patient. Questions regarding the procedure were encouraged and answered. The patient understands  and consents to the procedure. A time out was performed prior to initiating the procedure. Ultrasound was performed of the liver and lesions localized for sampling in the left lobe. The abdominal wall was prepped with chlorhexidine in a sterile fashion, and a sterile drape was applied covering the operative field. A sterile gown and sterile gloves were used for the procedure. Local anesthesia was provided with 1% Lidocaine. Under ultrasound guidance, a 17 gauge trocar needle was advanced into the left lobe of the liver. After confirming needle tip position, coaxial 18 gauge core biopsy samples were obtained of 2 separate adjacent liver lesions. A total  of 4 core biopsy samples were obtained and submitted in formalin. After the procedure Gel-Foam pledgets were advanced through the outer needle as it was retracted and removed. COMPLICATIONS: None. FINDINGS: Innumerable small hypoechoic lesions are seen throughout the liver parenchyma. 2 adjacent lesions were sampled in the left lobe of the liver measuring roughly 1.0 and 1.3 cm in greatest transverse diameter respectively. Note is made on initial imaging additional biliary ductal dilatation as well, particularly in the left lobe of the liver. This raises the possibility of central biliary obstruction and eventual correlation is suggested with CT of the abdomen and pelvis with IV contrast. Solid tissue was obtained from the to adjacent lesions within the left lobe of the liver. IMPRESSION: Ultrasound-guided core biopsy performed of 2 adjacent solid hypoechoic lesions within the left lobe of the liver. Innumerable lesions are seen throughout the liver parenchyma as well as evidence of biliary obstruction with dilated intrahepatic ducts, particularly in the left lobe. Recommend eventual correlation with CT of the chest, abdomen and pelvis for further staging. Electronically Signed   By: Aletta Edouard M.D.   On: 11/15/2017 14:33   Dg Ercp With Sphincterotomy  Result  Date: 11/17/2017 CLINICAL DATA:  Obstructive jaundice EXAM: ERCP TECHNIQUE: Multiple spot images obtained with the fluoroscopic device and submitted for interpretation post-procedure. COMPARISON:  MRCP 11/16/2017 and previous FINDINGS: Series of fluoroscopic spot images document endoscopic catheterization and opacification of the biliary tree. Diffuse narrowing of the mid and distal CBD, and at the confluence of the biliary ducts. Passage of a balloon catheter through the CBD, and subsequent placement of metallic stent across the CBD, and an overlapping stent into the left lobe duct. No extravasation identified. IMPRESSION: 1. Endoscopic CBD catheterization and intervention including stenting of left and common bile ducts. These images were submitted for radiologic interpretation only. Please see the procedural report for the amount of contrast and the fluoroscopy time utilized. Electronically Signed   By: Lucrezia Europe M.D.   On: 11/17/2017 13:58   Mr Abdomen Mrcp Moise Boring Contast  Addendum Date: 11/16/2017   ADDENDUM REPORT: 11/16/2017 11:55 ADDENDUM: In addition, there are several bone lesions in the visualized lower thoracic and upper lumbar spine, consistent with bone metastases. Electronically Signed   By: Earle Gell M.D.   On: 11/16/2017 11:55   Result Date: 11/16/2017 CLINICAL DATA:  Jaundice. Abdominal pain. Personal history of breast carcinoma. EXAM: MRI ABDOMEN WITHOUT AND WITH CONTRAST (INCLUDING MRCP) TECHNIQUE: Multiplanar multisequence MR imaging of the abdomen was performed both before and after the administration of intravenous contrast. Heavily T2-weighted images of the biliary and pancreatic ducts were obtained, and three-dimensional MRCP images were rendered by post processing. CONTRAST:  6 mL Gadavist COMPARISON:  CT on 11/15/2017 FINDINGS: Lower chest: No acute findings. Hepatobiliary: Numerous hypovascular masses are seen throughout the liver, consistent with diffuse liver metastases.  Largest index lesion is located in the posterior right hepatic lobe measuring 8.6 x 5.3 cm. Gallbladder is incompletely distended. Mild wall thickening is seen without pericholecystic inflammatory changes. This is nonspecific and likely due to hepatocellular disease. Marked diffuse biliary ductal dilatation is seen. 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 This mass is centered in the porta hepatis and abuts the superior margin of the pancreatic head. Differential diagnosis includes pancreatic carcinoma, cholangiocarcinoma, and metastatic porta hepatis lymphadenopathy. Pancreas: 2.1 cm soft tissue mass in the  porta hepatis abuts the superior margin of the pancreatic head. Pancreas is otherwise unremarkable in appearance and there is no evidence of pancreatic ductal dilatation. Spleen:  Within normal limits in size and appearance. Adrenals/Urinary Tract: No masses identified. No evidence of hydronephrosis. Stomach/Bowel: Visualized portion unremarkable. Vascular/Lymphatic: Soft-tissue mass in porta hepatis which is contiguous with the pancreatic head may represent lymphadenopathy versus pancreatic or common bile duct neoplasm. No other sites of lymphadenopathy identified. No abdominal aortic aneurysm. Other:  None. Musculoskeletal:  No suspicious bone lesions identified. IMPRESSION: Diffuse liver metastases. Diffuse biliary ductal dilatation due to 2.1 cm soft tissue mass in the porta hepatis and involving the superior margin of the pancreatic head. Differential diagnosis includes primary pancreatic carcinoma, cholangiocarcinoma, and metastatic lymphadenopathy. Consider ERCP for further evaluation. Electronically Signed: By: Earle Gell M.D. On: 11/16/2017 09:49   [place] Outside [employer] Body  Result Date: 11/15/2017 This examination belongs to an outside facility and is stored here for comparison purposes  only.  Contact the originating outside institution for any associated report or interpretation.      Subjective: Patient continue to have back pain, improved with analgesics, tolerating well soft diet, no dyspnea or chest pain. Feeling comfortable in going home today.   Discharge Exam: Vitals:   11/19/17 0627 11/19/17 0906  BP: 134/82   Pulse: 84   Resp: 16   Temp: 98.2 F (36.8 C)   SpO2: 96% 94%   Vitals:   11/18/17 2233 11/19/17 0230 11/19/17 0627 11/19/17 0906  BP:   134/82   Pulse:   84   Resp:   16   Temp: 99.6 F (37.6 C) 99.9 F (37.7 C) 98.2 F (36.8 C)   TempSrc: Oral Oral Oral   SpO2:   96% 94%  Weight:      Height:        General: deconditioned  Neurology: Awake and alert, non focal  E ENT: no pallor, positive icterus, oral mucosa moist Cardiovascular: No JVD. S1-S2 present, rhythmic, no gallops, rubs, or murmurs. No lower extremity edema. Pulmonary: positive breath sounds bilaterally, adequate air movement, no wheezing, rhonchi or rales. Gastrointestinal. Abdomen mild distended, no organomegaly, no rebound or guarding. Tender to deep palpation Skin. No rashes Musculoskeletal: no joint deformities   The results of significant diagnostics from this hospitalization (including imaging, microbiology, ancillary and laboratory) are listed below for reference.     Microbiology: Recent Results (from the past 240 hour(s))  Culture, blood (routine x 2)     Status: None (Preliminary result)   Collection Time: 11/15/17  5:06 PM  Result Value Ref Range Status   Specimen Description   Final    BLOOD RIGHT ANTECUBITAL Performed at [facility] [address], [place], [place] [address]    Special Requests   Final    BOTTLES DRAWN AEROBIC AND ANAEROBIC Blood Culture results may not be optimal due to an excessive volume of blood received in culture bottles Performed at [facility] [address] [place], [place]  [address]    Culture   Final    NO GROWTH 4 DAYS Performed at [facility], [place] [address], [place], [place] [address]    Report Status PENDING  Incomplete  Culture, blood (routine x 2)     Status: None (Preliminary result)   Collection Time: 11/15/17  5:22 PM  Result Value Ref Range Status   Specimen Description   Final    BLOOD RIGHT ANTECUBITAL Performed at [facility]  [place] [address] [place], [place] [address]    Special Requests   Final    BOTTLES DRAWN AEROBIC AND ANAEROBIC Blood Culture adequate volume Performed at [facility] [address], [place], [place] [address]    Culture   Final    NO GROWTH 4 DAYS Performed at [facility], [place] [address] [place], [place] [address]    Report Status PENDING  Incomplete     Labs: BNP (last 3 results) No results for input(s): BNP in the last 8760 hours. Basic Metabolic Panel: Recent Labs  Lab 11/15/17 1706 11/16/17 0518 11/18/17 0353 11/19/17 0421  NA 130* 132* 132* 129*  K 3.9 3.7 3.7 3.4*  CL 98 104 99 95*  CO2 25 23 24 24   GLUCOSE 119* 127* 157* 98  BUN 15 13 8 11   CREATININE 0.61 0.34* 0.31* 0.44  CALCIUM 8.7* 8.4* 8.3* 8.0*   Liver Function Tests: Recent Labs  Lab 11/15/17 1706 11/16/17 0518 11/18/17 0353 11/19/17 0421  AST 152* 141* 153* 108*  ALT 167* 143* 130* 95*  ALKPHOS 1,233* 1,128* 1,066* 912*  BILITOT 11.1* 10.9* 9.9* 7.3*  PROT 6.6 5.4* 5.2* 5.0*  ALBUMIN 3.1* 2.4* 2.1* 2.2*   Recent Labs  Lab 11/15/17 1706  LIPASE 36   Recent Labs  Lab 11/15/17 1706  AMMONIA 65*   CBC: Recent Labs  Lab 11/15/17 1113 11/15/17 1706 11/16/17 0518 11/18/17 0353  WBC 15.2* 13.4* 12.0* 15.2*  NEUTROABS  --  10.8*  --  13.1*  HGB 12.8 11.7* 10.3* 11.5*  HCT 38.0 34.6* 31.1* 33.6*  MCV 95.2 94.5 96.3 93.1  PLT 263 258 200 293   Cardiac Enzymes: No results for input(s): CKTOTAL, CKMB, CKMBINDEX, TROPONINI in the last 168 hours. BNP: Invalid  input(s): POCBNP CBG: No results for input(s): GLUCAP in the last 168 hours. D-Dimer No results for input(s): DDIMER in the last 72 hours. Hgb A1c No results for input(s): HGBA1C in the last 72 hours. Lipid Profile No results for input(s): CHOL, HDL, LDLCALC, TRIG, CHOLHDL, LDLDIRECT in the last 72 hours. Thyroid function studies No results for input(s): TSH, T4TOTAL, T3FREE, THYROIDAB in the last 72 hours.  Invalid input(s): FREET3 Anemia work up No results for input(s): VITAMINB12, FOLATE, FERRITIN, TIBC, IRON, RETICCTPCT in the last 72 hours. Urinalysis    Component Value Date/Time   COLORURINE AMBER (A) 11/15/2017 1724   APPEARANCEUR HAZY (A) 11/15/2017 1724   LABSPEC 1.018 11/15/2017 1724   PHURINE 6.0 11/15/2017 1724   GLUCOSEU NEGATIVE 11/15/2017 1724   HGBUR NEGATIVE 11/15/2017 1724   BILIRUBINUR MODERATE (A) 11/15/2017 1724   KETONESUR 5 (A) 11/15/2017 1724   PROTEINUR 30 (A) 11/15/2017 1724   NITRITE NEGATIVE 11/15/2017 1724   LEUKOCYTESUR NEGATIVE 11/15/2017 1724   Sepsis Labs Invalid input(s): PROCALCITONIN,  WBC,  LACTICIDVEN Microbiology Recent Results (from the past 240 hour(s))  Culture, blood (routine x 2)     Status: None (Preliminary result)   Collection Time: 11/15/17  5:06 PM  Result Value Ref Range Status   Specimen Description   Final    BLOOD RIGHT ANTECUBITAL Performed at [facility], [place] [address], [place], [place] [address]    Special Requests   Final    BOTTLES DRAWN AEROBIC AND ANAEROBIC Blood Culture results may not be optimal due to an excessive volume of blood received in culture bottles Performed at [facility] [address], [place], [place] [address]  Culture   Final    NO GROWTH 4 DAYS Performed at [facility], [place] [address] [place], [place] [address]    Report Status PENDING  Incomplete  Culture, blood (routine x 2)     Status: None (Preliminary result)   Collection Time:  11/15/17  5:22 PM  Result Value Ref Range Status   Specimen Description   Final    BLOOD RIGHT ANTECUBITAL Performed at [facility] [address] [place], [place] [address]    Special Requests   Final    BOTTLES DRAWN AEROBIC AND ANAEROBIC Blood Culture adequate volume Performed at [facility] [address], [place], [place] [address]    Culture   Final    NO GROWTH 4 DAYS Performed at [facility], [place] [address], [place], [place] [address]    Report Status PENDING  Incomplete     Time coordinating discharge: 45 minutes  SIGNED:   Tawni Millers, MD  [facility] 11/19/2017, 11:38 AM Pager [phone]  If 7PM-7AM, please contact night-coverage [email or web address] Password [email or web address]

## 2017-11-19 NOTE — Progress Notes (Signed)
Subjective: Abdominal discomfort slightly better. Having bowel movements now.  Objective: Vital signs in last 24 hours: Temp:  [97.5 F (36.4 C)-99.9 F (37.7 C)] 98.8 F (37.1 C) (10/25 1354) Pulse Rate:  [84-91] 91 (10/25 1354) Resp:  [16-18] 18 (10/25 1354) BP: (126-135)/(82-92) 133/82 (10/25 1354) SpO2:  [94 %-100 %] 98 % (10/25 1354) Weight change:  Last BM Date: 11/18/17  PE: GEN:  Jaundiced, deconditioned-appearing ABD:  Mild protuberant, mild distention, mild generalized tenderness without peritonitis  Lab Results: CBC    Component Value Date/Time   WBC 12.4 (H) 11/19/2017 1333   RBC 2.79 (L) 11/19/2017 1333   HGB 9.0 (L) 11/19/2017 1333   HGB 14.7 04/11/2013 1041   HCT 26.1 (L) 11/19/2017 1333   HCT 44.2 04/11/2013 1041   PLT 221 11/19/2017 1333   PLT 168 04/11/2013 1041   MCV 93.5 11/19/2017 1333   MCV 101.5 (H) 04/11/2013 1041   MCH 32.3 11/19/2017 1333   MCHC 34.5 11/19/2017 1333   RDW 13.9 11/19/2017 1333   RDW 13.1 04/11/2013 1041   LYMPHSABS 0.5 (L) 11/19/2017 1333   LYMPHSABS 0.9 04/11/2013 1041   MONOABS 1.4 (H) 11/19/2017 1333   MONOABS 0.5 04/11/2013 1041   EOSABS 0.1 11/19/2017 1333   EOSABS 0.1 04/11/2013 1041   BASOSABS 0.1 11/19/2017 1333   BASOSABS 0.1 04/11/2013 1041   CMP     Component Value Date/Time   NA 129 (L) 11/19/2017 0421   NA 139 04/11/2013 1041   K 3.4 (L) 11/19/2017 0421   K 4.8 04/11/2013 1041   CL 95 (L) 11/19/2017 0421   CL 102 04/15/2012 1230   CO2 24 11/19/2017 0421   CO2 26 04/11/2013 1041   GLUCOSE 98 11/19/2017 0421   GLUCOSE 131 04/11/2013 1041   GLUCOSE 105 (H) 04/15/2012 1230   BUN 11 11/19/2017 0421   BUN 12.0 04/11/2013 1041   CREATININE 0.44 11/19/2017 0421   CREATININE 0.7 04/11/2013 1041   CALCIUM 8.0 (L) 11/19/2017 0421   CALCIUM 9.9 04/11/2013 1041   PROT 5.0 (L) 11/19/2017 0421   PROT 6.8 04/11/2013 1041   ALBUMIN 2.2 (L) 11/19/2017 0421   ALBUMIN 4.4 04/11/2013 1041   AST 108 (H)  11/19/2017 0421   AST 26 04/11/2013 1041   ALT 95 (H) 11/19/2017 0421   ALT 23 04/11/2013 1041   ALKPHOS 912 (H) 11/19/2017 0421   ALKPHOS 63 04/11/2013 1041   BILITOT 7.3 (H) 11/19/2017 0421   BILITOT 0.61 04/11/2013 1041   GFRNONAA >60 11/19/2017 0421   GFRAA >60 11/19/2017 0421    Assessment:  Obstructive jaundice; CBD and left intrahepatic stents placed.  Some improvement of LFTs. Constipation. 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  Plan:  1.  OK to d/c home today from GI perspective. 2.  Recheck LFTs next week; if still downtrending, do EUS only; if not continuing to downtrend, consider repeat ERCP as well as EUS. 3.  [facility] will sign-off; will contact patient for expedited outpatient follow-up.   [name] 11/19/2017, 2:06 PM   Cell [phone] If no answer or after 5 PM call [phone]

## 2017-11-19 NOTE — Progress Notes (Signed)
During assessment this RN inquired about history of mastectomy and which side it was on. Pt states this was years ago and her oncologist said she does not need to restrict either extremity. Will continue to monitor.

## 2017-11-20 LAB — CULTURE, BLOOD (ROUTINE X 2)
CULTURE: NO GROWTH
Culture: NO GROWTH
Special Requests: ADEQUATE

## 2017-11-22 ENCOUNTER — Other Ambulatory Visit: Payer: Self-pay | Admitting: *Deleted

## 2017-11-22 ENCOUNTER — Telehealth: Payer: Self-pay | Admitting: *Deleted

## 2017-11-22 ENCOUNTER — Ambulatory Visit
Admission: RE | Admit: 2017-11-22 | Discharge: 2017-11-22 | Disposition: A | Discharge status: Home / Self Care | Payer: BLUE CROSS/BLUE SHIELD | Source: Ambulatory Visit | Attending: Radiation Oncology | Admitting: Radiation Oncology

## 2017-11-22 DIAGNOSIS — C7951 Secondary malignant neoplasm of bone: Secondary | ICD-10-CM | POA: Diagnosis not present

## 2017-11-22 DIAGNOSIS — Z510 Encounter for antineoplastic radiation therapy: Secondary | ICD-10-CM | POA: Diagnosis not present

## 2017-11-22 DIAGNOSIS — Z853 Personal history of malignant neoplasm of breast: Secondary | ICD-10-CM | POA: Diagnosis not present

## 2017-11-22 DIAGNOSIS — C787 Secondary malignant neoplasm of liver and intrahepatic bile duct: Secondary | ICD-10-CM | POA: Diagnosis present

## 2017-11-22 NOTE — Telephone Encounter (Signed)
This RN was contacted by [employer] [facility] per need for scheduling of scans ordered last week by Dr Jana Hakim.  Of note orders were place prior to pt being admitted for severe pain and increased bilirubin post biopsy. While in pt pt had further work up - and care was transferred from Dr Jana Hakim to Dr Burr Medico.  Pt was d/ced and is scheduled for follow up tomorrow with Dr Burr Medico.  Orders placed prior to admission by Dr Jana Hakim were canceled

## 2017-11-23 ENCOUNTER — Other Ambulatory Visit: Payer: Self-pay | Admitting: Gastroenterology

## 2017-11-23 ENCOUNTER — Inpatient Hospital Stay (HOSPITAL_BASED_OUTPATIENT_CLINIC_OR_DEPARTMENT_OTHER): Payer: BLUE CROSS/BLUE SHIELD | Admitting: Hematology

## 2017-11-23 ENCOUNTER — Inpatient Hospital Stay (HOSPITAL_BASED_OUTPATIENT_CLINIC_OR_DEPARTMENT_OTHER): Payer: BLUE CROSS/BLUE SHIELD | Admitting: Licensed Clinical Social Worker

## 2017-11-23 ENCOUNTER — Encounter: Payer: Self-pay | Admitting: Hematology

## 2017-11-23 ENCOUNTER — Ambulatory Visit
Admission: RE | Admit: 2017-11-23 | Discharge: 2017-11-23 | Disposition: A | Discharge status: Home / Self Care | Payer: BLUE CROSS/BLUE SHIELD | Source: Ambulatory Visit | Attending: Radiation Oncology | Admitting: Radiation Oncology

## 2017-11-23 ENCOUNTER — Other Ambulatory Visit: Payer: BLUE CROSS/BLUE SHIELD

## 2017-11-23 ENCOUNTER — Encounter: Payer: BLUE CROSS/BLUE SHIELD | Admitting: Genetics

## 2017-11-23 ENCOUNTER — Inpatient Hospital Stay: Payer: BLUE CROSS/BLUE SHIELD

## 2017-11-23 ENCOUNTER — Telehealth: Payer: Self-pay | Admitting: Hematology

## 2017-11-23 VITALS — BP 148/98 | HR 92 | Temp 97.9°F | Resp 20 | Ht 67.0 in | Wt 149.2 lb

## 2017-11-23 DIAGNOSIS — C50811 Malignant neoplasm of overlapping sites of right female breast: Secondary | ICD-10-CM

## 2017-11-23 DIAGNOSIS — K769 Liver disease, unspecified: Secondary | ICD-10-CM | POA: Diagnosis not present

## 2017-11-23 DIAGNOSIS — Z801 Family history of malignant neoplasm of trachea, bronchus and lung: Secondary | ICD-10-CM | POA: Insufficient documentation

## 2017-11-23 DIAGNOSIS — Z720 Tobacco use: Secondary | ICD-10-CM

## 2017-11-23 DIAGNOSIS — N3000 Acute cystitis without hematuria: Secondary | ICD-10-CM

## 2017-11-23 DIAGNOSIS — C221 Intrahepatic bile duct carcinoma: Secondary | ICD-10-CM | POA: Diagnosis not present

## 2017-11-23 DIAGNOSIS — Z510 Encounter for antineoplastic radiation therapy: Secondary | ICD-10-CM | POA: Diagnosis not present

## 2017-11-23 DIAGNOSIS — K831 Obstruction of bile duct: Secondary | ICD-10-CM | POA: Diagnosis not present

## 2017-11-23 DIAGNOSIS — C7951 Secondary malignant neoplasm of bone: Secondary | ICD-10-CM | POA: Diagnosis not present

## 2017-11-23 DIAGNOSIS — C779 Secondary and unspecified malignant neoplasm of lymph node, unspecified: Secondary | ICD-10-CM

## 2017-11-23 DIAGNOSIS — Z853 Personal history of malignant neoplasm of breast: Secondary | ICD-10-CM | POA: Diagnosis not present

## 2017-11-23 DIAGNOSIS — Z315 Encounter for genetic counseling: Secondary | ICD-10-CM

## 2017-11-23 DIAGNOSIS — G893 Neoplasm related pain (acute) (chronic): Secondary | ICD-10-CM | POA: Diagnosis not present

## 2017-11-23 DIAGNOSIS — Z87891 Personal history of nicotine dependence: Secondary | ICD-10-CM | POA: Diagnosis not present

## 2017-11-23 DIAGNOSIS — Z170 Estrogen receptor positive status [ER+]: Secondary | ICD-10-CM

## 2017-11-23 DIAGNOSIS — C787 Secondary malignant neoplasm of liver and intrahepatic bile duct: Secondary | ICD-10-CM

## 2017-11-23 DIAGNOSIS — I10 Essential (primary) hypertension: Secondary | ICD-10-CM

## 2017-11-23 DIAGNOSIS — Z7189 Other specified counseling: Secondary | ICD-10-CM

## 2017-11-23 DIAGNOSIS — R350 Frequency of micturition: Secondary | ICD-10-CM

## 2017-11-23 DIAGNOSIS — F418 Other specified anxiety disorders: Secondary | ICD-10-CM

## 2017-11-23 LAB — COMPREHENSIVE METABOLIC PANEL
ALT: 66 U/L — AB (ref 0–44)
ANION GAP: 10 (ref 5–15)
AST: 95 U/L — ABNORMAL HIGH (ref 15–41)
Albumin: 2.3 g/dL — ABNORMAL LOW (ref 3.5–5.0)
Alkaline Phosphatase: 1330 U/L — ABNORMAL HIGH (ref 38–126)
BUN: 12 mg/dL (ref 6–20)
CHLORIDE: 95 mmol/L — AB (ref 98–111)
CO2: 27 mmol/L (ref 22–32)
CREATININE: 0.6 mg/dL (ref 0.44–1.00)
Calcium: 8.9 mg/dL (ref 8.9–10.3)
Glucose, Bld: 111 mg/dL — ABNORMAL HIGH (ref 70–99)
Potassium: 3.8 mmol/L (ref 3.5–5.1)
SODIUM: 132 mmol/L — AB (ref 135–145)
Total Bilirubin: 4.3 mg/dL (ref 0.3–1.2)
Total Protein: 5.9 g/dL — ABNORMAL LOW (ref 6.5–8.1)

## 2017-11-23 LAB — CBC WITH DIFFERENTIAL/PLATELET
Abs Immature Granulocytes: 0.23 10*3/uL — ABNORMAL HIGH (ref 0.00–0.07)
BASOS ABS: 0.1 10*3/uL (ref 0.0–0.1)
Basophils Relative: 0 %
EOS ABS: 0.1 10*3/uL (ref 0.0–0.5)
EOS PCT: 1 %
HCT: 26.6 % — ABNORMAL LOW (ref 36.0–46.0)
HEMOGLOBIN: 9 g/dL — AB (ref 12.0–15.0)
IMMATURE GRANULOCYTES: 2 %
Lymphocytes Relative: 5 %
Lymphs Abs: 0.6 10*3/uL — ABNORMAL LOW (ref 0.7–4.0)
MCH: 31.9 pg (ref 26.0–34.0)
MCHC: 33.8 g/dL (ref 30.0–36.0)
MCV: 94.3 fL (ref 80.0–100.0)
Monocytes Absolute: 1.2 10*3/uL — ABNORMAL HIGH (ref 0.1–1.0)
Monocytes Relative: 10 %
NEUTROS PCT: 82 %
NRBC: 0 % (ref 0.0–0.2)
Neutro Abs: 9.6 10*3/uL — ABNORMAL HIGH (ref 1.7–7.7)
Platelets: 364 10*3/uL (ref 150–400)
RBC: 2.82 MIL/uL — AB (ref 3.87–5.11)
RDW: 14.2 % (ref 11.5–15.5)
WBC: 11.7 10*3/uL — AB (ref 4.0–10.5)

## 2017-11-23 LAB — URINALYSIS, COMPLETE (UACMP) WITH MICROSCOPIC
BILIRUBIN URINE: NEGATIVE
GLUCOSE, UA: NEGATIVE mg/dL
Hgb urine dipstick: NEGATIVE
KETONES UR: NEGATIVE mg/dL
Leukocytes, UA: NEGATIVE
NITRITE: NEGATIVE
Protein, ur: NEGATIVE mg/dL
Specific Gravity, Urine: 1.011 (ref 1.005–1.030)
pH: 7 (ref 5.0–8.0)

## 2017-11-23 MED ORDER — ONDANSETRON HCL 8 MG PO TABS: 8.0000 mg | ORAL_TABLET | Freq: Two times a day (BID) | ORAL | 1 refills | Status: AC | PRN

## 2017-11-23 MED ORDER — LIDOCAINE-PRILOCAINE 2.5-2.5 % EX CREA: TOPICAL_CREAM | CUTANEOUS | 3 refills | Status: AC

## 2017-11-23 MED ORDER — MORPHINE SULFATE ER 15 MG PO TBCR: 15.0000 mg | EXTENDED_RELEASE_TABLET | Freq: Two times a day (BID) | ORAL | 0 refills | Status: AC

## 2017-11-23 MED ORDER — OXYCODONE HCL 5 MG PO TABS: 5.0000 mg | ORAL_TABLET | ORAL | 0 refills | Status: AC | PRN

## 2017-11-23 NOTE — Progress Notes (Signed)
  Oncology Nurse Navigator Documentation  Navigator Location: [facility] (11/23/17 1338) Referral date to RadOnc/MedOnc: 11/11/17 (11/23/17 1338) )Navigator Encounter Type: Initial MedOnc (11/23/17 1338)   Met with Judson Roch, husband and two daughters. Explained role of nurse navigator. Contact names and phone numbers were provided for entire [facility] team.  No barriers to care identified at present time.  Will continue to follow as needed I will upload imges to PACS for second opinion. Abnormal Finding Date: 11/11/17 (11/23/17 1338) Confirmed Diagnosis Date: 11/15/17 (11/23/17 1338)               Patient Visit Type: MedOnc;Inpatient (11/23/17 1338) Treatment Phase: Pre-Tx/Tx Discussion (11/23/17 1338) Barriers/Navigation Needs: No barriers at this time (11/23/17 1338)   Interventions: Coordination of Care;Other(Upload Imaging to PACS) (11/23/17 1338)            Acuity: Level 3 (11/23/17 1338)     Acuity Level 3: Ongoing guidance and education provided throughout treatment (11/23/17 1338)   Time Spent with Patient: 30 (11/23/17 1338)

## 2017-11-23 NOTE — Progress Notes (Signed)
START OFF PATHWAY REGIMEN - [Other Dx]   [id number]:Cisplatin 25 mg/m2 D1,8 + Gemcitabine 1,000 mg/m2 D1,8 q21 Days:   A cycle is every 21 days:     Gemcitabine      Cisplatin   **Always confirm dose/schedule in your pharmacy ordering system**  Patient Characteristics: Intent of Therapy: Non-Curative / Palliative Intent, Discussed with Patient 

## 2017-11-23 NOTE — Telephone Encounter (Signed)
Gave pt avs and calendar  °

## 2017-11-23 NOTE — Progress Notes (Signed)
REFERRING PROVIDER: Truitt Merle, MD [address] [place], [place] [address]  PRIMARY PROVIDER:  Chesley Noon, MD  PRIMARY REASON FOR VISIT:  1. 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 Personal history of breast cancer   3. Family history of lung cancer      HISTORY OF PRESENT ILLNESS:   Ms. Belvin, a 57 y.o. female, was seen for a [place] cancer genetics consultation at the request of Dr. Burr Medico due to a personal history of cancer breast cancer.  Ms. Majid presents to clinic today to discuss the possibility of a hereditary predisposition to cancer, genetic testing, and to further clarify her future cancer risks, as well as potential cancer risks for family members.   At the age of 7, Ms. Blades was diagnosed with breast cancer, ER+/PR+/HER2-, she underwent mastectomy, adjuvant chemotherapy and took tamoxifen for 10 years.  In 2019, at the age of 63, Ms. 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   CANCER HISTORY:     Malignant neoplasm of overlapping sites of right breast in female, estrogen receptor positive ([place])   1999 Surgery    status post right mastectomy 1999 with immediate reconstruction, total 9 right axillary lymph nodes removed     04/1998 - 12/2008 Anti-estrogen oral therapy    status post tamoxifen April 2000 through December 2009, then letrozole for 1 year    2000 -  Chemotherapy    status post cyclophosphamide and doxorubicin x4 adjuvantly    11/11/2017 Initial Diagnosis    Malignant neoplasm of overlapping sites of right breast in female, estrogen receptor positive ([place]) - pT1b pN1, 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  First live birth at age 41.  OCP use for approximately 10 years.  Ovaries intact: no.  Hysterectomy: yes.   Menopausal status: postmenopausal.  HRT use: 0 years. Colonoscopy: yes; normal. Mammogram within the last year: yes. Number of breast biopsies: 1.   Past Medical History:  Diagnosis Date  . Breast cancer ([place])   . Breast cancer, stage 2 ([place]) 04/05/2011  . Family history of lung cancer   . HTN (hypertension), benign 04/05/2011  . Hyperglycemia without ketosis 04/05/2011  . IBD (inflammatory bowel disease) 04/05/2011    Past Surgical History:  Procedure Laterality Date  . ABDOMINAL HYSTERECTOMY    . AUGMENTATION MAMMAPLASTY Bilateral   . BILIARY STENT PLACEMENT  11/17/2017   Procedure: BILIARY STENT PLACEMENT;  Surgeon: Arta Silence, MD;  Location: [facility] ENDOSCOPY;  Service: Endoscopy;;  . ERCP N/A 11/17/2017   Procedure: ENDOSCOPIC RETROGRADE CHOLANGIOPANCREATOGRAPHY (ERCP);  Surgeon: Arta Silence, MD;  Location: [facility] ENDOSCOPY;  Service: Endoscopy;  Laterality: N/A;  . KNEE SURGERY    . MASTECTOMY Right   . SPHINCTEROTOMY  11/17/2017   Procedure: SPHINCTEROTOMY;  Surgeon: Arta Silence, MD;  Location: [facility] ENDOSCOPY;  Service: Endoscopy;;  . WRIST SURGERY      Social History   Socioeconomic History  . Marital status: Married    Spouse name: Not on file  . Number of children: Not on file  . Years of education: Not on file  . Highest education level: Not on file  Occupational History  . Not on file  Social Needs  . Financial resource strain: Not on file  . Food insecurity:    Worry: Not  on file    Inability: Not on file  . Transportation needs:    Medical: Not on file    Non-medical: Not on file  Tobacco Use  . Smoking status: Never Smoker  . Smokeless tobacco: Never Used  Substance and Sexual Activity  . Alcohol use: Yes  . Drug use: Never  . Sexual activity: Not on file  Lifestyle  . Physical activity:    Days per week: Not on file    Minutes per session: Not on file  . Stress: Not on file  Relationships  . Social connections:    Talks on phone: Not on  file    Gets together: Not on file    Attends religious service: Not on file    Active member of club or organization: Not on file    Attends meetings of clubs or organizations: Not on file    Relationship status: Not on file  Other Topics Concern  . Not on file  Social History Narrative  . Not on file     FAMILY HISTORY:  We obtained a detailed, 4-generation family history.  Significant diagnoses are listed below: Family History  Problem Relation Age of Onset  . Lung cancer Father        dx 57s, d 68, asbestos exposure  . Heart attack Paternal Uncle    Ms. Lehr has 3 daughters: Georgina Peer, 31, Walker, 29, and Masako, 24. Kelley has a son and a daughter. Ms. Kittleson also has one brother, Will, age 60, with no cancer history. He has a son and a daughter who are doing well.   Ms. Lorence father was diagnosed with lung cancer in his 31's and died at 36. He was exposed to asbestos through his work in Architect. He had two sisters and a brother. His brother passed from a heart attack, and one of his sisters died young but the patient is unsure of the cause. His other sister is living. No cancers for the patient's paternal cousins that she is aware of. Her paternal grandmother lived to be 62, and her paternal grandfather died of a heart attack in his 29's.   Ms. Dolata mother died at 3. She has three sisters and one brother, all are living in their 70's-90's and cancer free. No cancers in the patient's maternal cousins. Ms. Rotter maternal grandmother died at 41 and grandfather died at 21.  Ms. Noorani is unaware of previous family history of genetic testing for hereditary cancer risks. Patient's maternal ancestors are of English/Irish descent, and paternal ancestors are of [place] descent. There is no reported Ashkenazi Jewish ancestry. There is no known consanguinity.  GENETIC COUNSELING ASSESSMENT: TRUDIE CERVANTES is a 57 y.o. female with a personal history of breast  cancer which is somewhat suggestive of a Hereditary Cancer Predisposition Syndrome. We, therefore, discussed and recommended the following at today's visit.   DISCUSSION: We discussed that about 5-10% of breast cancer cases are hereditary with most cases due to BRCA mutations. Other genes associated with hereditary breast cancer cases include ATM, CHEK2 and PALB2.  We reviewed the characteristics, features and inheritance patterns of hereditary cancer syndromes. We also discussed genetic testing, including the appropriate family members to test, the process of testing, insurance coverage and turn-around-time for results. We discussed the implications of a negative, positive and/or variant of uncertain significant result. We recommended Ms. Olivares pursue genetic testing for the [employer].  The Multi-Cancer Panel offered by [facility] includes sequencing and/or deletion  duplication testing of the following 84 genes: AIP, ALK, APC, ATM, AXIN2,BAP1,  BARD1, BLM, BMPR1A, BRCA1, BRCA2, BRIP1, CASR, CDC73, CDH1, CDK4, CDKN1B, CDKN1C, CDKN2A (p14ARF), CDKN2A (p16INK4a), CEBPA, CHEK2, CTNNA1, DICER1, DIS3L2, EGFR (c.2369C>T, p.Thr790Met variant only), EPCAM (Deletion/duplication testing only), FH, FLCN, GATA2, GPC3, GREM1 (Promoter region deletion/duplication testing only), HOXB13 (c.251G>A, p.Gly84Glu), HRAS, KIT, MAX, MEN1, MET, MITF (c.952G>A, p.Glu318Lys variant only), MLH1, MSH2, MSH3, MSH6, MUTYH, NBN, NF1, NF2, NTHL1, PALB2, PDGFRA, PHOX2B, PMS2, POLD1, POLE, POT1, PRKAR1A, PTCH1, PTEN, RAD50, RAD51C, RAD51D, RB1, RECQL4, RET, RUNX1, SDHAF2, SDHA (sequence changes only), SDHB, SDHC, SDHD, SMAD4, SMARCA4, SMARCB1, SMARCE1, STK11, SUFU, TERC, TERT, TMEM127, TP53, TSC1, TSC2, VHL, WRN and WT1.   We discussed that if she is found to have a mutation in one of these genes, it may alter future medical management recommendations such as increased cancer screenings and consideration of risk reducing  surgeries.  A positive result could also have implications for the patient's family members.  A Negative result would mean we were unable to identify a hereditary component to her cancer, but does not rule out the possibility of a hereditary basis for her cancer.  There could be mutations that are undetectable by current technology, or in genes not yet tested or identified to increase cancer risk.    We discussed the potential to find a Variant of Uncertain Significance or VUS.  These are variants that have not yet been identified as pathogenic or benign, and it is unknown if this variant is associated with increased cancer risk or if this is a normal finding.  Most VUS's are reclassified to benign or likely benign.   It should not be used to make medical management decisions. With time, we suspect the lab will determine the significance of any VUS's identified if any.   Based on Ms. Brunette's personal history of breast cancer at 68, she meets NCCN medical criteria for genetic testing. Despite that she meets criteria, she may still have an out of pocket cost. The lab will notify her of an out of pocket cost.   PLAN: After considering the risks, benefits, and limitations, Ms. Masley  provided informed consent to pursue genetic testing and the blood sample was sent to [facility] for analysis of the Multi-Cancer Panel. Results should be available within approximately 2-3 weeks' time, at which point they will be disclosed by telephone to Ms. Salas, as will any additional recommendations warranted by these results. Ms. Christiano will receive a summary of her genetic counseling visit and a copy of her results once available. This information will also be available in Epic.   Lastly, we encouraged Ms. Senteno to remain in contact with cancer genetics annually so that we can continuously update the family history and inform her of any changes in cancer genetics and testing that may be of benefit for this  family.   Ms.  Hoge questions were answered to her satisfaction today. Our contact information was provided should additional questions or concerns arise. Thank you for the referral and allowing [place] to share in the care of your patient.   Faith Rogue, MS Genetic Counselor [email or web address] Phone: [phone]   The patient was seen for a total of 35 minutes in face-to-face genetic counseling.  The patient was accompanied today by her husband, [name] and her daughters, Georgina Peer and Gilford Rile. This patient was discussed with Drs. Magrinat, Lindi Adie and/or Burr Medico who agrees with the above.

## 2017-11-23 NOTE — Progress Notes (Signed)
  Oncology Nurse Navigator Documentation  Navigator Location: [facility]- (11/23/17 1558)   )Navigator Encounter Type: Other(Requested that imaging from 11/15/17 to present be loaded in) (11/23/17 1558)    E-mail sent to Margit Hanks to facilitate imaging  Be uploaded to PACS for second opinion.                                      Acuity: Level 3 (11/23/17 1558)         Time Spent with Patient: 15 (11/23/17 1558)

## 2017-11-24 ENCOUNTER — Ambulatory Visit
Admission: RE | Admit: 2017-11-24 | Discharge: 2017-11-24 | Disposition: A | Discharge status: Home / Self Care | Payer: BLUE CROSS/BLUE SHIELD | Source: Ambulatory Visit | Attending: Radiation Oncology | Admitting: Radiation Oncology

## 2017-11-24 ENCOUNTER — Other Ambulatory Visit: Payer: Self-pay

## 2017-11-24 ENCOUNTER — Telehealth: Payer: Self-pay

## 2017-11-24 DIAGNOSIS — Z510 Encounter for antineoplastic radiation therapy: Secondary | ICD-10-CM | POA: Diagnosis not present

## 2017-11-24 LAB — URINE CULTURE: CULTURE: NO GROWTH

## 2017-11-24 NOTE — Progress Notes (Signed)
Called and spoke with patient's husband to make sure that they had the information needed for second opinion at [facility] on 11/25/17. Images pushed to power share by radiology. Per husband's request I called Rad/On to have  Treatment moved from 2:30 to earlier in the day to accommodate appointment at [facility] at 2:30 PM. Rad/onc tech will reach out to patient to. Patient has my direct line for  questions or concerns.

## 2017-11-24 NOTE — Progress Notes (Signed)
Started radiation 11/18/17. Start Cisplatin and Gemzar 02/07/17. Second opinions at [facility] and [facility].

## 2017-11-24 NOTE — Telephone Encounter (Signed)
Spoke with patient's husband [name] with appointment for port placement 11/12 at 1:00 at  [facility] after 8 am, needs a driver, arrive 15 to 20 minutes early, he verbalized an understanding.

## 2017-11-24 NOTE — Progress Notes (Signed)
Upper EUS scheduled for 11/26/17 at [facility].

## 2017-11-25 ENCOUNTER — Other Ambulatory Visit: Payer: Self-pay | Admitting: Gastroenterology

## 2017-11-25 ENCOUNTER — Other Ambulatory Visit: Payer: Self-pay

## 2017-11-25 ENCOUNTER — Ambulatory Visit
Admission: RE | Admit: 2017-11-25 | Discharge: 2017-11-25 | Disposition: A | Discharge status: Home / Self Care | Payer: BLUE CROSS/BLUE SHIELD | Source: Ambulatory Visit | Attending: Radiation Oncology | Admitting: Radiation Oncology

## 2017-11-25 ENCOUNTER — Encounter (HOSPITAL_COMMUNITY): Payer: Self-pay | Admitting: *Deleted

## 2017-11-25 DIAGNOSIS — Z510 Encounter for antineoplastic radiation therapy: Secondary | ICD-10-CM | POA: Diagnosis not present

## 2017-11-25 NOTE — Progress Notes (Signed)
Mrs Markwood denies chest pain or shortness of breath.  Patient did not want the number ofr ENDO, "we will not need it."  I said ok, if that should change call the operator and ask for ENDO.

## 2017-11-26 ENCOUNTER — Encounter (HOSPITAL_COMMUNITY)
Admission: RE | Disposition: A | Discharge status: Home / Self Care | Payer: Self-pay | Source: Ambulatory Visit | Attending: Gastroenterology

## 2017-11-26 ENCOUNTER — Ambulatory Visit (HOSPITAL_COMMUNITY): Payer: BLUE CROSS/BLUE SHIELD | Admitting: Anesthesiology

## 2017-11-26 ENCOUNTER — Encounter (HOSPITAL_COMMUNITY): Payer: Self-pay

## 2017-11-26 ENCOUNTER — Ambulatory Visit (HOSPITAL_COMMUNITY)
Admission: RE | Admit: 2017-11-26 | Discharge: 2017-11-26 | Disposition: A | Discharge status: Home / Self Care | Payer: BLUE CROSS/BLUE SHIELD | Source: Ambulatory Visit | Attending: Gastroenterology | Admitting: Gastroenterology

## 2017-11-26 ENCOUNTER — Other Ambulatory Visit: Payer: Self-pay

## 2017-11-26 ENCOUNTER — Ambulatory Visit
Admission: RE | Admit: 2017-11-26 | Discharge: 2017-11-26 | Disposition: A | Discharge status: Home / Self Care | Payer: BLUE CROSS/BLUE SHIELD | Source: Ambulatory Visit | Attending: Radiation Oncology | Admitting: Radiation Oncology

## 2017-11-26 DIAGNOSIS — R748 Abnormal levels of other serum enzymes: Secondary | ICD-10-CM | POA: Diagnosis not present

## 2017-11-26 DIAGNOSIS — C787 Secondary malignant neoplasm of liver and intrahepatic bile duct: Secondary | ICD-10-CM | POA: Insufficient documentation

## 2017-11-26 DIAGNOSIS — Z853 Personal history of malignant neoplasm of breast: Secondary | ICD-10-CM | POA: Insufficient documentation

## 2017-11-26 DIAGNOSIS — K831 Obstruction of bile duct: Secondary | ICD-10-CM | POA: Insufficient documentation

## 2017-11-26 DIAGNOSIS — Z9882 Breast implant status: Secondary | ICD-10-CM | POA: Diagnosis not present

## 2017-11-26 DIAGNOSIS — R739 Hyperglycemia, unspecified: Secondary | ICD-10-CM | POA: Insufficient documentation

## 2017-11-26 DIAGNOSIS — I10 Essential (primary) hypertension: Secondary | ICD-10-CM | POA: Diagnosis not present

## 2017-11-26 DIAGNOSIS — C7951 Secondary malignant neoplasm of bone: Secondary | ICD-10-CM | POA: Insufficient documentation

## 2017-11-26 DIAGNOSIS — Z510 Encounter for antineoplastic radiation therapy: Secondary | ICD-10-CM | POA: Diagnosis not present

## 2017-11-26 DIAGNOSIS — R160 Hepatomegaly, not elsewhere classified: Secondary | ICD-10-CM | POA: Insufficient documentation

## 2017-11-26 DIAGNOSIS — K589 Irritable bowel syndrome without diarrhea: Secondary | ICD-10-CM | POA: Diagnosis not present

## 2017-11-26 DIAGNOSIS — Z87891 Personal history of nicotine dependence: Secondary | ICD-10-CM | POA: Insufficient documentation

## 2017-11-26 DIAGNOSIS — R188 Other ascites: Secondary | ICD-10-CM | POA: Diagnosis not present

## 2017-11-26 DIAGNOSIS — K7290 Hepatic failure, unspecified without coma: Secondary | ICD-10-CM | POA: Insufficient documentation

## 2017-11-26 HISTORY — PX: ESOPHAGOGASTRODUODENOSCOPY (EGD) WITH PROPOFOL: SHX5813

## 2017-11-26 HISTORY — PX: EUS: SHX5427

## 2017-11-26 SURGERY — ULTRASOUND, UPPER GI TRACT, ENDOSCOPIC
Anesthesia: Monitor Anesthesia Care

## 2017-11-26 MED ORDER — KETAMINE HCL 10 MG/ML IJ SOLN
INTRAMUSCULAR | Status: DC | PRN
  Administered 2017-11-26: 20 mg via INTRAVENOUS

## 2017-11-26 MED ORDER — LIDOCAINE 2% (20 MG/ML) 5 ML SYRINGE
INTRAMUSCULAR | Status: DC | PRN
  Administered 2017-11-26: 20 mg via INTRAVENOUS
  Administered 2017-11-26: 300 mg via INTRAVENOUS
  Administered 2017-11-26: 40 mg via INTRAVENOUS

## 2017-11-26 MED ORDER — PROPOFOL 10 MG/ML IV BOLUS
INTRAVENOUS | Status: DC | PRN
  Administered 2017-11-26: 40 mg via INTRAVENOUS
  Administered 2017-11-26: 20 mg via INTRAVENOUS
  Administered 2017-11-26: 30 mg via INTRAVENOUS
  Administered 2017-11-26: 75 mg via INTRAVENOUS
  Administered 2017-11-26: 40 mg via INTRAVENOUS
  Administered 2017-11-26: 20 mg via INTRAVENOUS
  Administered 2017-11-26: 40 mg via INTRAVENOUS

## 2017-11-26 MED ORDER — LACTATED RINGERS IV SOLN
INTRAVENOUS | Status: DC | PRN
  Administered 2017-11-26: 12:00:00 via INTRAVENOUS

## 2017-11-26 MED ORDER — KETAMINE HCL 50 MG/5ML IJ SOSY
PREFILLED_SYRINGE | INTRAMUSCULAR | Status: AC
  Filled 2017-11-26: qty 5

## 2017-11-26 MED ORDER — OXYCODONE HCL 5 MG PO TABS
ORAL_TABLET | ORAL | Status: AC
  Administered 2017-11-26: 5 mg
  Filled 2017-11-26: qty 1

## 2017-11-26 MED ORDER — PROPOFOL 500 MG/50ML IV EMUL
INTRAVENOUS | Status: DC | PRN
  Administered 2017-11-26: 75 ug/kg/min via INTRAVENOUS

## 2017-11-26 NOTE — Transfer of Care (Signed)
Immediate Anesthesia Transfer of Care Note  Patient: KYLIYAH STIRN  Procedure(s) Performed: FULL UPPER ENDOSCOPIC ULTRASOUND (EUS) RADIAL (N/A ) ESOPHAGOGASTRODUODENOSCOPY (EGD) WITH PROPOFOL (N/A )  Patient Location: Endoscopy Unit  Anesthesia Type:MAC  Level of Consciousness: drowsy and patient cooperative  Airway & Oxygen Therapy: Patient Spontanous Breathing and Patient connected to nasal cannula oxygen  Post-op Assessment: Report given to RN, Post -op Vital signs reviewed and stable and Patient moving all extremities  Post vital signs: Reviewed and stable  Last Vitals:  Vitals Value Taken Time  BP    Temp    Pulse    Resp    SpO2      Last Pain:  Vitals:   11/26/17 1157  TempSrc: Oral  PainSc: 6          Complications: No apparent anesthesia complications

## 2017-11-26 NOTE — Op Note (Signed)
[facility] Patient Name: Caitlin Pollard Procedure Date : 11/26/2017 MRN: [id number] Attending MD: Arta Silence , MD Date of Birth: 29-Dec-1960 CSN: [id number] Age: 57 Admit Type: Inpatient Procedure:                Upper EUS Indications:              Common bile duct dilation (acquired) seen on MRCP,                            Obstruction of bile duct on MRCP, Elevated liver                            enzymes Providers:                Arta Silence, MD, Carlyn Reichert, RN, Charolette Child, Technician, Claybon Jabs CRNA, CRNA Referring MD:             Truitt Merle, MD Medicines:                Monitored Anesthesia Care Complications:            No immediate complications. Estimated Blood Loss:     Estimated blood loss: none. Procedure:                Pre-Anesthesia Assessment:                           - Prior to the procedure, a History and Physical                            was performed, and patient medications and                            allergies were reviewed. The patient's tolerance of                            previous anesthesia was also reviewed. The risks                            and benefits of the procedure and the sedation                            options and risks were discussed with the patient.                            All questions were answered, and informed consent                            was obtained. Prior Anticoagulants: The patient has                            taken no previous anticoagulant or antiplatelet  agents. ASA Grade Assessment: II - A patient with                            mild systemic disease. After reviewing the risks                            and benefits, the patient was deemed in                            satisfactory condition to undergo the procedure.                           After obtaining informed consent, the endoscope was                            passed under  direct vision. Throughout the                            procedure, the patient's blood pressure, pulse, and                            oxygen saturations were monitored continuously. The                            [id number] ([id number]) Olympus Radial EUS was                            introduced through the mouth, and advanced to the                            second part of duodenum. The upper EUS was                            accomplished without difficulty. The patient                            tolerated the procedure well. Scope In: Scope Out: Findings:      ENDOSONOGRAPHIC FINDING: :      Endosonographic images of the stomach were unremarkable.      There was significant shadowing artifact from biliary metal wallstents       in bile duct (CBD and L-IH), making complete visualization of pancreas       not possible. With this in mind, I did not see any discrete masses in       the pancreas. There was no sign of significant endosonographic       abnormality in the entire pancreas. No pathologic lymphadenopathy, no       masses. PD about 62mm in diameter.      Adenopathy, necrotic-appearing, was seen in periportal space. This did       not appear contiguous with the pancreas. Few scattered lesions was       identified endosonographically in the left lobe of the liver. Lesions       were hypoechoic and heterogenous, have been previously biopsied. The       endosonographic borders were poorly-defined.  Perihepatic free fluid (ascites) noted. Two stents were visualized       endosonographically in the common bile duct and in the left intrahepatic       bile duct(s). Extension of the stent was noted to level of mid left       intrahepatic duct. Impression:               - Endosonographic images of the stomach were                            unremarkable.                           - Significant shadowing artifact of pancreas from                            bile duct stents,  precluding complete                            visualization. However, with this limitation in                            mind, there was no sign of significant pathology in                            the entire pancreas.                           - Irregular liver masses seen in left lobe of                            liver, previously biopsied.                           - Two stents were visualized endosonographically in                            the common bile duct and in the left intrahepatic                            bile duct(s).                           - Periportal necrotic-appearing adenopathy without                            any clear communication or relationship to pancreas.                           - Perihepatic ascites (free fluid).                           - Overall, based on my findings, I would suspect                            gallbladder/bile duct system (and not pancreas) as  source of patient's adenocarcinoma, mindful of some                            limitations in views of pancreas from bile duct                            wallstents. Moderate Sedation:      None Recommendation:           - Discharge patient to home (ambulatory).                           - Resume previous diet today.                           - Continue present medications.                           - Return to GI clinic PRN.                           - Return to referring physician as previously                            scheduled. Procedure Code(s):        --- Professional ---                           [id number], Esophagogastroduodenoscopy, flexible,                            transoral; with endoscopic ultrasound examination                            limited to the esophagus, stomach or duodenum, and                            adjacent structures Diagnosis Code(s):        --- Professional ---                           R16.0, Hepatomegaly, not elsewhere  classified                           K83.1, Obstruction of bile duct                           R74.8, Abnormal levels of other serum enzymes                           K83.8, Other specified diseases of biliary tract CPT copyright 2018 American Medical Association. All rights reserved. The codes documented in this report are preliminary and upon coder review may  be revised to meet current compliance requirements. Arta Silence, MD 11/26/2017 1:40:07 PM This report has been signed electronically. Number of Addenda: 0

## 2017-11-26 NOTE — Anesthesia Postprocedure Evaluation (Signed)
Anesthesia Post Note  Patient: MCKENZYE CUTRIGHT  Procedure(s) Performed: FULL UPPER ENDOSCOPIC ULTRASOUND (EUS) RADIAL (N/A ) ESOPHAGOGASTRODUODENOSCOPY (EGD) WITH PROPOFOL (N/A )     Patient location during evaluation: PACU Anesthesia Type: MAC Level of consciousness: awake and alert Pain management: pain level controlled Vital Signs Assessment: post-procedure vital signs reviewed and stable Respiratory status: spontaneous breathing, nonlabored ventilation and respiratory function stable Cardiovascular status: blood pressure returned to baseline and stable Postop Assessment: no apparent nausea or vomiting Anesthetic complications: no    Last Vitals:  Vitals:   11/26/17 1338 11/26/17 1348  BP: 119/80 126/90  Pulse:  91  Resp: (!) 28 (!) 22  Temp: 37.3 C   SpO2: 100% 98%    Last Pain:  Vitals:   11/26/17 1348  TempSrc:   PainSc: [place]

## 2017-11-26 NOTE — Anesthesia Preprocedure Evaluation (Addendum)
Anesthesia Evaluation  Patient identified by MRN, date of birth, ID band Patient awake    Reviewed: Allergy & Precautions, NPO status , Patient's Chart, lab work & pertinent test results  Airway Mallampati: II  TM Distance: >3 FB Neck ROM: Full    Dental no notable dental hx. (+) Teeth Intact, Dental Advisory Given   Pulmonary neg pulmonary ROS, former smoker,    Pulmonary exam normal breath sounds clear to auscultation       Cardiovascular hypertension, Pt. on medications Normal cardiovascular exam Rhythm:Regular Rate:Normal     Neuro/Psych Anxiety Depression negative neurological ROS     GI/Hepatic negative GI ROS, (+)     substance abuse  ,   Endo/Other  negative endocrine ROS  Renal/GU negative Renal ROS     Musculoskeletal negative musculoskeletal ROS (+) narcotic dependent  Abdominal   Peds  Hematology negative hematology ROS (+)   Anesthesia Other Findings Day of surgery medications reviewed with the patient.  Reproductive/Obstetrics                            Anesthesia Physical Anesthesia Plan  ASA: II  Anesthesia Plan: MAC   Post-op Pain Management:    Induction: Intravenous  PONV Risk Score and Plan: 2 and Treatment may vary due to age or medical condition and Propofol infusion  Airway Management Planned: Natural Airway and Nasal Cannula  Additional Equipment:   Intra-op Plan:   Post-operative Plan:   Informed Consent: I have reviewed the patients History and Physical, chart, labs and discussed the procedure including the risks, benefits and alternatives for the proposed anesthesia with the patient or authorized representative who has indicated his/her understanding and acceptance.     Plan Discussed with: CRNA  Anesthesia Plan Comments:        Anesthesia Quick Evaluation

## 2017-11-26 NOTE — Interval H&P Note (Signed)
History and Physical Interval Note:  11/26/2017 12:50 PM  Caitlin Pollard  has presented today for surgery, with the diagnosis of pancreatic mass  The various methods of treatment have been discussed with the patient and family. After consideration of risks, benefits and other options for treatment, the patient has consented to  Procedure(s) with comments: FULL UPPER ENDOSCOPIC ULTRASOUND (EUS) RADIAL (N/A) - start radial maybe linear to follow - +/- FNA as a surgical intervention .  The patient's history has been reviewed, patient examined, no change in status, stable for surgery.  I have reviewed the patient's chart and labs.  Questions were answered to the patient's satisfaction.     [name]

## 2017-11-26 NOTE — Discharge Instructions (Signed)

## 2017-11-29 ENCOUNTER — Ambulatory Visit
Admission: RE | Admit: 2017-11-29 | Discharge: 2017-11-29 | Disposition: A | Discharge status: Home / Self Care | Payer: BLUE CROSS/BLUE SHIELD | Source: Ambulatory Visit | Attending: Radiation Oncology | Admitting: Radiation Oncology

## 2017-11-29 ENCOUNTER — Telehealth: Payer: Self-pay

## 2017-11-29 ENCOUNTER — Encounter (HOSPITAL_COMMUNITY): Payer: Self-pay | Admitting: Gastroenterology

## 2017-11-29 DIAGNOSIS — Z510 Encounter for antineoplastic radiation therapy: Secondary | ICD-10-CM | POA: Diagnosis not present

## 2017-11-29 NOTE — Telephone Encounter (Signed)
Patient's husband called stating they went to [facility] and are much more impressed with Dr. Burr Medico.  The one thing they do offer is called TheraBionic device which uses radio waves in conjunction with chemo and radiation.  They want to pursue this.  This was developed by the director of GI oncology Boris Pache.  They also requested her labs be drawn this week. Scheduling message was sent for Wednesday prior to her final radiation treatment.

## 2017-11-30 ENCOUNTER — Ambulatory Visit
Admission: RE | Admit: 2017-11-30 | Discharge: 2017-11-30 | Disposition: A | Discharge status: Home / Self Care | Payer: BLUE CROSS/BLUE SHIELD | Source: Ambulatory Visit | Attending: Radiation Oncology | Admitting: Radiation Oncology

## 2017-11-30 DIAGNOSIS — Z510 Encounter for antineoplastic radiation therapy: Secondary | ICD-10-CM | POA: Diagnosis not present

## 2017-12-01 ENCOUNTER — Encounter: Payer: Self-pay | Admitting: Radiation Oncology

## 2017-12-01 ENCOUNTER — Encounter (HOSPITAL_COMMUNITY): Payer: Self-pay | Admitting: Gastroenterology

## 2017-12-01 ENCOUNTER — Inpatient Hospital Stay: Payer: BLUE CROSS/BLUE SHIELD | Attending: Oncology

## 2017-12-01 ENCOUNTER — Ambulatory Visit
Admission: RE | Admit: 2017-12-01 | Discharge: 2017-12-01 | Disposition: A | Discharge status: Home / Self Care | Payer: BLUE CROSS/BLUE SHIELD | Source: Ambulatory Visit | Attending: Radiation Oncology | Admitting: Radiation Oncology

## 2017-12-01 ENCOUNTER — Encounter (HOSPITAL_COMMUNITY): Payer: Self-pay | Admitting: Hematology

## 2017-12-01 DIAGNOSIS — Z170 Estrogen receptor positive status [ER+]: Secondary | ICD-10-CM

## 2017-12-01 DIAGNOSIS — C779 Secondary and unspecified malignant neoplasm of lymph node, unspecified: Secondary | ICD-10-CM | POA: Insufficient documentation

## 2017-12-01 DIAGNOSIS — C50811 Malignant neoplasm of overlapping sites of right female breast: Secondary | ICD-10-CM

## 2017-12-01 DIAGNOSIS — Z720 Tobacco use: Secondary | ICD-10-CM

## 2017-12-01 DIAGNOSIS — C221 Intrahepatic bile duct carcinoma: Secondary | ICD-10-CM | POA: Insufficient documentation

## 2017-12-01 DIAGNOSIS — C7951 Secondary malignant neoplasm of bone: Secondary | ICD-10-CM | POA: Insufficient documentation

## 2017-12-01 DIAGNOSIS — Z510 Encounter for antineoplastic radiation therapy: Secondary | ICD-10-CM | POA: Diagnosis not present

## 2017-12-01 DIAGNOSIS — C787 Secondary malignant neoplasm of liver and intrahepatic bile duct: Secondary | ICD-10-CM | POA: Insufficient documentation

## 2017-12-01 LAB — CBC WITH DIFFERENTIAL/PLATELET
Abs Immature Granulocytes: 0.1 10*3/uL — ABNORMAL HIGH (ref 0.00–0.07)
BASOS ABS: 0 10*3/uL (ref 0.0–0.1)
Basophils Relative: 0 %
EOS ABS: 0.1 10*3/uL (ref 0.0–0.5)
Eosinophils Relative: 0 %
HEMATOCRIT: 28 % — AB (ref 36.0–46.0)
Hemoglobin: 9 g/dL — ABNORMAL LOW (ref 12.0–15.0)
Immature Granulocytes: 1 %
Lymphocytes Relative: 2 %
Lymphs Abs: 0.2 10*3/uL — ABNORMAL LOW (ref 0.7–4.0)
MCH: 31 pg (ref 26.0–34.0)
MCHC: 32.1 g/dL (ref 30.0–36.0)
MCV: 96.6 fL (ref 80.0–100.0)
Monocytes Absolute: 1.1 10*3/uL — ABNORMAL HIGH (ref 0.1–1.0)
Monocytes Relative: 10 %
NRBC: 0 % (ref 0.0–0.2)
Neutro Abs: 10.1 10*3/uL — ABNORMAL HIGH (ref 1.7–7.7)
Neutrophils Relative %: 87 %
Platelets: 305 10*3/uL (ref 150–400)
RBC: 2.9 MIL/uL — AB (ref 3.87–5.11)
RDW: 13.9 % (ref 11.5–15.5)
WBC: 11.5 10*3/uL — ABNORMAL HIGH (ref 4.0–10.5)

## 2017-12-01 LAB — COMPREHENSIVE METABOLIC PANEL
ALK PHOS: 1138 U/L — AB (ref 38–126)
ALT: 44 U/L (ref 0–44)
AST: 82 U/L — AB (ref 15–41)
Albumin: 2.4 g/dL — ABNORMAL LOW (ref 3.5–5.0)
Anion gap: 9 (ref 5–15)
BUN: 13 mg/dL (ref 6–20)
CALCIUM: 9.1 mg/dL (ref 8.9–10.3)
CO2: 27 mmol/L (ref 22–32)
CREATININE: 0.69 mg/dL (ref 0.44–1.00)
Chloride: 98 mmol/L (ref 98–111)
GFR calc non Af Amer: >60 mL/min (ref >60)
GLUCOSE: 108 mg/dL — AB (ref 70–99)
Potassium: 4.3 mmol/L (ref 3.5–5.1)
SODIUM: 134 mmol/L — AB (ref 135–145)
Total Bilirubin: 3 mg/dL — ABNORMAL HIGH (ref 0.3–1.2)
Total Protein: 6.2 g/dL — ABNORMAL LOW (ref 6.5–8.1)

## 2017-12-02 ENCOUNTER — Other Ambulatory Visit: Payer: Self-pay | Admitting: Radiation Oncology

## 2017-12-02 ENCOUNTER — Telehealth: Payer: Self-pay | Admitting: *Deleted

## 2017-12-02 MED ORDER — SUCRALFATE 1 G PO TABS: ORAL_TABLET | ORAL | 1 refills | Status: AC

## 2017-12-02 NOTE — Telephone Encounter (Signed)
Received TC from pt's husband regarding a prescription that was to have been called in yesterday for pt's painful throat.  The medication is magic mouthwash and it was from Dr. Lisbeth Renshaw. VM left for his nurse to call this in to pt's [facility] on [address].

## 2017-12-03 ENCOUNTER — Telehealth: Payer: Self-pay | Admitting: *Deleted

## 2017-12-03 NOTE — Telephone Encounter (Signed)
CALLED PATIENT TO ASK ABOUT CANCELLING FU ON 1-6-120, PATIENT'S HUSBAND - MARK AGREED TO THIS

## 2017-12-06 ENCOUNTER — Other Ambulatory Visit (HOSPITAL_COMMUNITY): Payer: BLUE CROSS/BLUE SHIELD

## 2017-12-06 ENCOUNTER — Other Ambulatory Visit: Payer: Self-pay | Admitting: Radiology

## 2017-12-06 ENCOUNTER — Ambulatory Visit: Payer: Self-pay | Admitting: Licensed Clinical Social Worker

## 2017-12-06 ENCOUNTER — Telehealth: Payer: Self-pay | Admitting: Licensed Clinical Social Worker

## 2017-12-06 ENCOUNTER — Encounter: Payer: Self-pay | Admitting: Licensed Clinical Social Worker

## 2017-12-06 DIAGNOSIS — Z1379 Encounter for other screening for genetic and chromosomal anomalies: Secondary | ICD-10-CM | POA: Insufficient documentation

## 2017-12-06 NOTE — Progress Notes (Signed)
HPI:  Ms. Grosshans was previously seen in [facility] on 11/[facility]/2017 due to a personal history of breast cancer and concerns regarding a hereditary predisposition to cancer. Please refer to our prior cancer genetics clinic note for more information regarding Ms. Parlier's medical, social and family histories, and our assessment and recommendations, at the time. Ms. Encarnacion recent genetic test results were disclosed to her, as well as recommendations warranted by these results. These results and recommendations are discussed in more detail below.  CANCER HISTORY:  Oncology History   Cancer Staging No matching staging information was found for the patient.       Malignant neoplasm of overlapping sites of right breast in female, estrogen receptor positive ([place])   1999 Surgery    status post right mastectomy 1999 with immediate reconstruction, total 9 right axillary lymph nodes removed     04/1998 - 12/2008 Anti-estrogen oral therapy    status post tamoxifen April 2000 through December 2009, then letrozole for 1 year    2000 -  Chemotherapy    status post cyclophosphamide and doxorubicin x4 adjuvantly    11/11/2017 Initial Diagnosis    Malignant neoplasm of overlapping sites of right breast in female, estrogen receptor positive ([place]) - pT1b pN1, 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 Estrogen Receptor: 0%, NEGATIVE Progesterone Receptor: 0%, NEGATIVE Proliferation Marker Ki67: 10%    11/15/2017 Pathology Results    Diagnosis Liver, needle/core biopsy - ADENOCARCINOMA, SEE COMMENT    11/15/2017 Imaging    11/15/2017 CT CAP IMPRESSION: 1. Hepatic metastatic disease with innumerable liver lesions, biopsied earlier today. No evidence of post biopsy hemorrhage or complication. 2. Intrahepatic and proximal  extrahepatic biliary ductal dilatation, abrupt transition proximally in the common duct with associated soft tissue density, which may represent adenopathy but is not well delineated. Ill-defined soft tissue density in the porta hepatis and portacaval region is likely adenopathy. This is contiguous with the uncinate process of the pancreas, focal pancreatic lesion difficult to exclude. No pancreatic ductal dilatation. 3. Attenuated portal vein at the portal splenic confluence, possibly occluded with suspected portal collaterals. Portal vein is otherwise patent. 4. Osseous metastatic disease with primarily sclerotic lesions, primarily involving the spine. Lesions involve T8, T9, T10, T11, and L1. Indeterminate lesion at T4 versus hemangioma. T10 lesion involves the entire vertebral body, with heterogeneous appearance of the lamina and possible extraosseous component and mass-effect on the spinal canal. Recommend thoracic spine MRI to evaluate for cord compression. 5. Nonspecific small pulmonary nodules, largest measuring 4 mm in the left upper lobe. 6. Small amount of perihepatic and pelvic ascites. 7. Aortic Atherosclerosis (ICD10-I70.0). Coronary artery calcifications.     11/15/2017 Imaging     11/15/2017 US Liver IMPRESSION: Ultrasound-guided core biopsy performed of 2 adjacent solid hypoechoic lesions within the left lobe of the liver. Innumerable lesions are seen throughout the liver parenchyma as well as evidence of biliary obstruction with dilated intrahepatic ducts, particularly in the left lobe. Recommend eventual correlation with CT of the chest, abdomen and pelvis for further staging.    11/16/2017 Imaging    11/16/2017 MRI Thoracic Spine IMPRESSION: 1. Multiple osseous metastasis (T5, T7, T8, T9, T10, T12 and L1), known T10 pathologic fracture better seen on yesterday's CT. 2. Epidural tumoral extension from T8-9 to T10-11 resulting in moderate to severe canal  stenosis at T10, no spinal cord edema.    11/16/2017 Imaging    11/16/2017 MRI Abdomen IMPRESSION: Diffuse liver metastases.  Diffuse biliary ductal dilatation due to 2.1 cm soft tissue mass in the porta hepatis and involving the superior margin of the pancreatic head. Differential diagnosis includes primary pancreatic carcinoma, cholangiocarcinoma, and metastatic lymphadenopathy. Consider ERCP for further evaluation.     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       FAMILY HISTORY:  We obtained a detailed, 4-generation family history.  Significant diagnoses are listed below: Family History  Problem Relation Age of Onset  . Lung cancer Father        dx 68s, d 27, asbestos exposure  . Heart attack Paternal Uncle    Ms. Guse has 3 daughters: Georgina Peer, 31, Walker, 29, and Ferrin, 24. Kelley has a son and a daughter. Ms. Pla also has one brother, Will, age 4, with no cancer history. He has a son and a daughter who are doing well.   Ms. Wyble father was diagnosed with lung cancer in his 74's and died at 42. He was exposed to asbestos through his work in Architect. He had two sisters and a brother. His brother passed from a heart attack, and one of his sisters died young but the patient is unsure of the cause. His other sister  is living. No cancers for the patient's paternal cousins that she is aware of. Her paternal grandmother lived to be 37, and her paternal grandfather died of a heart attack in his 24's.   Ms. Tocci mother died at 63. She has three sisters and one brother, all are living in their 95's-90's and cancer free. No cancers in the patient's maternal cousins. Ms. Blacksher maternal grandmother died at 44 and grandfather died at 29.  Ms. Bechler is unaware of previous family history of genetic testing for hereditary cancer risks. Patient's maternal ancestors are of English/Irish descent, and paternal ancestors are of [place] descent. There is no reported Ashkenazi Jewish ancestry. There is no known consanguinity.  GENETIC TEST RESULTS: Genetic testing performed through [facility]'s Multi-Cancer Panel reported out on 12/01/2017 showed no pathogenic mutations. The Multi-Cancer Panel offered by [facility] includes sequencing and/or deletion duplication testing of the following 84 genes: AIP, ALK, APC, ATM, AXIN2,BAP1,  BARD1, BLM, BMPR1A, BRCA1, BRCA2, BRIP1, CASR, CDC73, CDH1, CDK4, CDKN1B, CDKN1C, CDKN2A (p14ARF), CDKN2A (p16INK4a), CEBPA, CHEK2, CTNNA1, DICER1, DIS3L2, EGFR (c.2369C>T, p.Thr790Met variant only), EPCAM (Deletion/duplication testing only), FH, FLCN, GATA2, GPC3, GREM1 (Promoter region deletion/duplication testing only), HOXB13 (c.251G>A, p.Gly84Glu), HRAS, KIT, MAX, MEN1, MET, MITF (c.952G>A, p.Glu318Lys variant only), MLH1, MSH2, MSH3, MSH6, MUTYH, NBN, NF1, NF2, NTHL1, PALB2, PDGFRA, PHOX2B, PMS2, POLD1, POLE, POT1, PRKAR1A, PTCH1, PTEN, RAD50, RAD51C, RAD51D, RB1, RECQL4, RET, RUNX1, SDHAF2, SDHA (sequence changes only), SDHB, SDHC, SDHD, SMAD4, SMARCA4, SMARCB1, SMARCE1, STK11,  SUFU, TERC, TERT, TMEM127, TP53, TSC1, TSC2, VHL, WRN and WT1.   The test report will be scanned into EPIC and will be located under the Molecular Pathology section of the Results Review tab. A portion of the  result report is included below for reference.     We discussed with Ms. Schmutz that because current genetic testing is not perfect, it is possible there may be a gene mutation in one of these genes that current testing cannot detect, but that chance is small.  We also discussed, that there could be another gene that has not yet been discovered, or that we have not yet tested, that is responsible for the cancer diagnoses in the family. It is also possible there is a hereditary cause for the cancer in the family that Ms. Want did not inherit and therefore was not identified in her testing.  Therefore, it is important to remain in touch with cancer genetics in the future so that we can continue to offer Ms. Scrima the most up to date genetic testing.   ADDITIONAL GENETIC TESTING: We discussed with Ms. Steven that her genetic testing was fairly extensive.  If there are are genes identified to increase cancer risk that can be analyzed in the future, we would be happy to discuss and coordinate this testing at that time.    CANCER SCREENING RECOMMENDATIONS: Ms. Ronning test result is considered negative (normal).  This means that we have not identified a hereditary cause for her history of cancer.  While reassuring, this does not definitively rule out a hereditary predisposition to cancer. It is still possible that there could be genetic mutations that are undetectable by current technology, or genetic mutations in genes that have not been tested or identified to increase cancer risk.  Therefore, it is recommended she continue to follow the cancer management and screening guidelines provided by her oncology and primary healthcare provider. An individual's cancer risk is not determined by genetic test results alone.  Overall cancer risk assessment includes additional factors such as personal medical history, family history, etc.  These should be used to make a personalized plan for cancer prevention and  surveillance.    RECOMMENDATIONS FOR FAMILY MEMBERS:  Relatives in this family might be at some increased risk of developing cancer, over the general population risk, simply due to the family history of cancer.  We recommended women in this family have a yearly mammogram beginning at age 38, or 41 years younger than the earliest onset of cancer, an annual clinical breast exam, and perform monthly breast self-exams. Women in this family should also have a gynecological exam as recommended by their primary provider. All family members should have a colonoscopy by age 31 (or as directed by their doctors).  All family members should inform their physicians about the family history of cancer so their doctors can make the most appropriate screening recommendations for them.   FOLLOW-UP: Lastly, we discussed with Ms. Tolen that cancer genetics is a rapidly advancing field and it is possible that new genetic tests will be appropriate for her and/or her family members in the future. We encouraged her to remain in contact with cancer genetics on an annual basis so we can update her personal and family histories and let her know of advances in cancer genetics that may benefit this family.   Our contact number was provided. Ms. Horgan questions were answered to her satisfaction, and she knows she is welcome to call us at anytime with  additional questions or concerns.  Faith Rogue, MS Genetic Counselor [email or web address] Phone: [phone]

## 2017-12-06 NOTE — Telephone Encounter (Signed)
Revealed negative genetic testing.  We discussed that we do not know why she has had breast cancer. It could be due to a different gene that we are not testing, or something our current technology cannot pick up.  It will be important for her to keep in contact with genetics to learn if additional testing may be needed in the future.

## 2017-12-07 ENCOUNTER — Ambulatory Visit (HOSPITAL_COMMUNITY)
Admission: RE | Admit: 2017-12-07 | Discharge: 2017-12-07 | Disposition: A | Discharge status: Home / Self Care | Payer: BLUE CROSS/BLUE SHIELD | Source: Ambulatory Visit | Attending: Hematology | Admitting: Hematology

## 2017-12-07 ENCOUNTER — Inpatient Hospital Stay: Payer: BLUE CROSS/BLUE SHIELD

## 2017-12-07 ENCOUNTER — Other Ambulatory Visit: Payer: Self-pay | Admitting: Hematology

## 2017-12-07 ENCOUNTER — Encounter (HOSPITAL_COMMUNITY): Payer: Self-pay | Admitting: Interventional Radiology

## 2017-12-07 ENCOUNTER — Telehealth: Payer: Self-pay

## 2017-12-07 DIAGNOSIS — K589 Irritable bowel syndrome without diarrhea: Secondary | ICD-10-CM | POA: Insufficient documentation

## 2017-12-07 DIAGNOSIS — Z90710 Acquired absence of both cervix and uterus: Secondary | ICD-10-CM | POA: Insufficient documentation

## 2017-12-07 DIAGNOSIS — Z853 Personal history of malignant neoplasm of breast: Secondary | ICD-10-CM | POA: Insufficient documentation

## 2017-12-07 DIAGNOSIS — C221 Intrahepatic bile duct carcinoma: Secondary | ICD-10-CM | POA: Diagnosis not present

## 2017-12-07 DIAGNOSIS — Z9011 Acquired absence of right breast and nipple: Secondary | ICD-10-CM | POA: Insufficient documentation

## 2017-12-07 DIAGNOSIS — Z79899 Other long term (current) drug therapy: Secondary | ICD-10-CM | POA: Insufficient documentation

## 2017-12-07 DIAGNOSIS — N3000 Acute cystitis without hematuria: Secondary | ICD-10-CM | POA: Diagnosis present

## 2017-12-07 DIAGNOSIS — R739 Hyperglycemia, unspecified: Secondary | ICD-10-CM | POA: Diagnosis not present

## 2017-12-07 DIAGNOSIS — Z95820 Peripheral vascular angioplasty status with implants and grafts: Secondary | ICD-10-CM | POA: Insufficient documentation

## 2017-12-07 DIAGNOSIS — Z9889 Other specified postprocedural states: Secondary | ICD-10-CM | POA: Insufficient documentation

## 2017-12-07 DIAGNOSIS — I10 Essential (primary) hypertension: Secondary | ICD-10-CM | POA: Insufficient documentation

## 2017-12-07 HISTORY — PX: IR IMAGING GUIDED PORT INSERTION: IMG5740

## 2017-12-07 LAB — PROTIME-INR
INR: 1.07
Prothrombin Time: 13.8 seconds (ref 11.4–15.2)

## 2017-12-07 LAB — CBC WITH DIFFERENTIAL/PLATELET
ABS IMMATURE GRANULOCYTES: 0.1 10*3/uL — AB (ref 0.00–0.07)
BASOS PCT: 0 %
Basophils Absolute: 0 10*3/uL (ref 0.0–0.1)
EOS ABS: 0 10*3/uL (ref 0.0–0.5)
EOS PCT: 0 %
HEMATOCRIT: 29.5 % — AB (ref 36.0–46.0)
Hemoglobin: 9 g/dL — ABNORMAL LOW (ref 12.0–15.0)
Immature Granulocytes: 1 %
Lymphocytes Relative: 3 %
Lymphs Abs: 0.3 10*3/uL — ABNORMAL LOW (ref 0.7–4.0)
MCH: 29.7 pg (ref 26.0–34.0)
MCHC: 30.5 g/dL (ref 30.0–36.0)
MCV: 97.4 fL (ref 80.0–100.0)
MONO ABS: 1.6 10*3/uL — AB (ref 0.1–1.0)
Monocytes Relative: 13 %
Neutro Abs: 9.6 10*3/uL — ABNORMAL HIGH (ref 1.7–7.7)
Neutrophils Relative %: 83 %
Platelets: 196 10*3/uL (ref 150–400)
RBC: 3.03 MIL/uL — ABNORMAL LOW (ref 3.87–5.11)
RDW: 13.7 % (ref 11.5–15.5)
WBC: 11.7 10*3/uL — ABNORMAL HIGH (ref 4.0–10.5)
nRBC: 0 % (ref 0.0–0.2)

## 2017-12-07 MED ORDER — CEFAZOLIN SODIUM-DEXTROSE 2-4 GM/100ML-% IV SOLN
INTRAVENOUS | Status: AC
  Administered 2017-12-07: 2 g via INTRAVENOUS
  Filled 2017-12-07: qty 100

## 2017-12-07 MED ORDER — FENTANYL CITRATE (PF) 100 MCG/2ML IJ SOLN
INTRAMUSCULAR | Status: AC
  Filled 2017-12-07: qty 2

## 2017-12-07 MED ORDER — HEPARIN SOD (PORK) LOCK FLUSH 100 UNIT/ML IV SOLN
INTRAVENOUS | Status: AC
  Filled 2017-12-07: qty 5

## 2017-12-07 MED ORDER — MIDAZOLAM HCL 2 MG/2ML IJ SOLN
INTRAMUSCULAR | Status: AC | PRN
  Administered 2017-12-07 (×3): 1 mg via INTRAVENOUS

## 2017-12-07 MED ORDER — LIDOCAINE HCL (PF) 1 % IJ SOLN
INTRAMUSCULAR | Status: AC | PRN
  Administered 2017-12-07: 5 mL

## 2017-12-07 MED ORDER — LIDOCAINE HCL 1 % IJ SOLN
INTRAMUSCULAR | Status: AC
  Filled 2017-12-07: qty 20

## 2017-12-07 MED ORDER — LIDOCAINE HCL (PF) 1 % IJ SOLN
INTRAMUSCULAR | Status: AC | PRN
  Administered 2017-12-07: 10 mL

## 2017-12-07 MED ORDER — CEFAZOLIN SODIUM-DEXTROSE 2-4 GM/100ML-% IV SOLN
2.0000 g | INTRAVENOUS | Status: AC
  Administered 2017-12-07: 2 g via INTRAVENOUS

## 2017-12-07 MED ORDER — MIDAZOLAM HCL 2 MG/2ML IJ SOLN
INTRAMUSCULAR | Status: AC
  Filled 2017-12-07: qty 4

## 2017-12-07 MED ORDER — FENTANYL CITRATE (PF) 100 MCG/2ML IJ SOLN
INTRAMUSCULAR | Status: AC | PRN
  Administered 2017-12-07 (×2): 50 ug via INTRAVENOUS

## 2017-12-07 MED ORDER — HEPARIN SOD (PORK) LOCK FLUSH 100 UNIT/ML IV SOLN
INTRAVENOUS | Status: AC | PRN
  Administered 2017-12-07: 500 [IU] via INTRAVENOUS

## 2017-12-07 MED ORDER — SODIUM CHLORIDE 0.9 % IV SOLN: INTRAVENOUS | Status: DC

## 2017-12-07 NOTE — Discharge Instructions (Signed)
Moderate Conscious Sedation, Adult, Care After °These instructions provide you with information about caring for yourself after your procedure. Your health care provider may also give you more specific instructions. Your treatment has been planned according to current medical practices, but problems sometimes occur. Call your health care provider if you have any problems or questions after your procedure. °What can I expect after the procedure? °After your procedure, it is common: °· To feel sleepy for several hours. °· To feel clumsy and have poor balance for several hours. °· To have poor judgment for several hours. °· To vomit if you eat too soon. ° °Follow these instructions at home: °For at least 24 hours after the procedure: ° °· Do not: °? Participate in activities where you could fall or become injured. °? Drive. °? Use heavy machinery. °? Drink alcohol. °? Take sleeping pills or medicines that cause drowsiness. °? Make important decisions or sign legal documents. °? Take care of children on your own. °· Rest. °Eating and drinking °· Follow the diet recommended by your health care provider. °· If you vomit: °? Drink water, juice, or soup when you can drink without vomiting. °? Make sure you have little or no nausea before eating solid foods. °General instructions °· Have a responsible adult stay with you until you are awake and alert. °· Take over-the-counter and prescription medicines only as told by your health care provider. °· If you smoke, do not smoke without supervision. °· Keep all follow-up visits as told by your health care provider. This is important. °Contact a health care provider if: °· You keep feeling nauseous or you keep vomiting. °· You feel light-headed. °· You develop a rash. °· You have a fever. °Get help right away if: °· You have trouble breathing. °This information is not intended to replace advice given to you by your health care provider. Make sure you discuss any questions you have  with your health care provider. °Document Released: 11/02/2012 Document Revised: 06/17/2015 Document Reviewed: 05/04/2015 °Elsevier Interactive Patient Education © 2018 [employer] ° ° °Implanted Port Insertion, Care After °This sheet gives you information about how to care for yourself after your procedure. Your health care provider may also give you more specific instructions. If you have problems or questions, contact your health care provider. °What can I expect after the procedure? °After your procedure, it is common to have: °· Discomfort at the port insertion site. °· Bruising on the skin over the port. This should improve over 3-4 days. ° °Follow these instructions at home: °Port care °· After your port is placed, you will get a manufacturer's information card. The card has information about your port. Keep this card with you at all times. °· Take care of the port as told by your health care provider. Ask your health care provider if you or a family member can get training for taking care of the port at home. A home health care nurse may also take care of the port. °· Make sure to remember what type of port you have. °Incision care °· Follow instructions from your health care provider about how to take care of your port insertion site. Make sure you: °? Wash your hands with soap and water before you change your bandage (dressing). If soap and water are not available, use hand sanitizer. °? Change your dressing as told by your health care provider.  You may remove your dressing tomorrow. °? Leave stitches (sutures), skin glue, or adhesive strips   in place. These skin closures may need to stay in place for 2 weeks or longer. If adhesive strip edges start to loosen and curl up, you may trim the loose edges. Do not remove adhesive strips completely unless your health care provider tells you to do that.  DO NOT use EMLA cream for 2 weeks after port placement as this cream will remove surgical glue on your  incision. °· Check your port insertion site every day for signs of infection. Check for: °? More redness, swelling, or pain. °? More fluid or blood. °? Warmth. °? Pus or a bad smell. °General instructions °· Do not take baths, swim, or use a hot tub until your health care provider approves.  You may shower tomorrow. °· Do not lift anything that is heavier than 10 lb (4.5 kg) for a week, or as told by your health care provider. °· Ask your health care provider when it is okay to: °? Return to work or school. °? Resume usual physical activities or sports. °· Do not drive for 24 hours if you were given a medicine to help you relax (sedative). °· Take over-the-counter and prescription medicines only as told by your health care provider. °· Wear a medical alert bracelet in case of an emergency. This will tell any health care providers that you have a port. °· Keep all follow-up visits as told by your health care provider. This is important. °Contact a health care provider if: °· You cannot flush your port with saline as directed, or you cannot draw blood from the port. °· You have a fever or chills. °· You have more redness, swelling, or pain around your port insertion site. °· You have more fluid or blood coming from your port insertion site. °· Your port insertion site feels warm to the touch. °· You have pus or a bad smell coming from the port insertion site. °Get help right away if: °· You have chest pain or shortness of breath. °· You have bleeding from your port that you cannot control. °Summary °· Take care of the port as told by your health care provider. °· Change your dressing as told by your health care provider. °· Keep all follow-up visits as told by your health care provider. °This information is not intended to replace advice given to you by your health care provider. Make sure you discuss any questions you have with your health care provider. °Document Released: 11/02/2012 Document Revised: 12/04/2015  Document Reviewed: 12/04/2015 °Elsevier Interactive Patient Education © 2017 [employer] Inc. ° ° °

## 2017-12-07 NOTE — Telephone Encounter (Signed)
Made of copy of information from [facility] brought in by patient's husband [name] for Dr. Burr Medico and Dr. Tammi Klippel (hand delivered to [place]) left with his nurse.

## 2017-12-07 NOTE — H&P (Signed)
Referring Physician(s): Feng,Yan  Supervising Physician: Corrie Mckusick  Patient Status:  [facility]  Chief Complaint:  "I'm getting a port a cath"  Subjective: Patient familiar to IR service from prior liver lesion biopsy on 11/15/2017.  She has a remote history of right breast cancer with prior mastectomy and chemotherapy.  She has been recently diagnosed with metastatic cholangiocarcinoma and has undergone common bile duct/left hepatic duct stenting on 11/17/2017. She presents again today for Port-A-Cath placement for planned treatment.  She denies fever, chest pain, worsening dyspnea, or abnormal bleeding. She does have occasional headaches, occasional cough, intermittent abdominal/back pain as well as occasional nausea.  Past Medical History:  Diagnosis Date  . Breast cancer ([place])   . Breast cancer, stage 2 ([place]) 04/05/2011   Mestatic to liver,bones, possible gallbladder  . Family history of lung cancer   . HTN (hypertension), benign 04/05/2011  . Hyperglycemia without ketosis 04/05/2011  . IBD (inflammatory bowel disease) 04/05/2011   Past Surgical History:  Procedure Laterality Date  . ABDOMINAL HYSTERECTOMY    . AUGMENTATION MAMMAPLASTY Bilateral   . BILIARY STENT PLACEMENT  11/17/2017   Procedure: BILIARY STENT PLACEMENT;  Surgeon: Arta Silence, MD;  Location: [facility] ENDOSCOPY;  Service: Endoscopy;;  . COLONOSCOPY    . ERCP N/A 11/17/2017   Procedure: ENDOSCOPIC RETROGRADE CHOLANGIOPANCREATOGRAPHY (ERCP);  Surgeon: Arta Silence, MD;  Location: [facility] ENDOSCOPY;  Service: Endoscopy;  Laterality: N/A;  . ESOPHAGOGASTRODUODENOSCOPY (EGD) WITH PROPOFOL N/A 11/26/2017   Procedure: ESOPHAGOGASTRODUODENOSCOPY (EGD) WITH PROPOFOL;  Surgeon: Arta Silence, MD;  Location: [place];  Service: Endoscopy;  Laterality: N/A;  . EUS N/A 11/26/2017   Procedure: FULL UPPER ENDOSCOPIC ULTRASOUND (EUS) RADIAL;  Surgeon: Arta Silence, MD;  Location: [place];  Service: Endoscopy;   Laterality: N/A;  . KNEE SURGERY Right    ACL   . MASTECTOMY Right   . SPHINCTEROTOMY  11/17/2017   Procedure: SPHINCTEROTOMY;  Surgeon: Arta Silence, MD;  Location: [facility] ENDOSCOPY;  Service: Endoscopy;;      Allergies: Patient has no known allergies.  Medications: Prior to Admission medications   Medication Sig Start Date End Date Taking? Authorizing Provider  FLUoxetine (PROZAC) 10 MG capsule Take 10 mg by mouth daily as needed (depression).  07/14/17 07/15/18  [provider]  ibuprofen (ADVIL,MOTRIN) 400 MG tablet Take 1 tablet (400 mg total) by mouth every 6 (six) hours as needed (back pain). 11/19/17   Arrien, Jimmy Picket, MD  lidocaine-prilocaine (EMLA) cream Apply to affected area once 11/23/17   Truitt Merle, MD  LORazepam (ATIVAN) 0.5 MG tablet Take 1 tablet (0.5 mg total) by mouth 2 (two) times daily. Patient taking differently: Take 0.5 mg by mouth 2 (two) times daily as needed for anxiety or sleep.  11/12/17   Magrinat, Virgie Dad, MD  losartan (COZAAR) 100 MG tablet Take 100 mg by mouth at bedtime.    [provider]  morphine (MS CONTIN) 15 MG 12 hr tablet Take 1 tablet (15 mg total) by mouth every 12 (twelve) hours. 11/23/17   Truitt Merle, MD  ondansetron (ZOFRAN) 8 MG tablet Take 1 tablet (8 mg total) by mouth 2 (two) times daily as needed. Start on the third day after chemotherapy. Patient taking differently: Take 8 mg by mouth 2 (two) times daily as needed for nausea or vomiting. Start on the third day after chemotherapy. 11/23/17   Truitt Merle, MD  oxyCODONE (OXY IR/ROXICODONE) 5 MG immediate release tablet Take 1 tablet (5 mg total) by mouth every  4 (four) hours as needed for moderate pain. 11/23/17   Truitt Merle, MD  pantoprazole (PROTONIX) 40 MG tablet Take 1 tablet (40 mg total) by mouth daily. Patient taking differently: Take 40 mg by mouth daily as needed (acid reflux).  11/20/17 12/20/17  Arrien, Jimmy Picket, MD  polyethylene glycol [facility] /  Floria Raveling) packet Take 17 g by mouth daily. Patient taking differently: Take 17 g by mouth daily as needed for mild constipation.  11/20/17   Arrien, Jimmy Picket, MD  promethazine (PHENERGAN) 25 MG tablet Take 1 tablet (25 mg total) by mouth every 6 (six) hours as needed for nausea or vomiting. 11/19/17   Arrien, Jimmy Picket, MD  senna (SENOKOT) 8.6 MG TABS tablet Take 1 tablet by mouth daily as needed for mild constipation.    [provider]  sucralfate (CARAFATE) 1 g tablet Crush 1 tab and mix in 1 oz water 12 qAC and HS 12/02/17   Hayden Pedro, PA-C     Vital Signs: Blood pressure 132/95, heart rate 88, respirations 19, O2 sat 94% room air   Physical Exam awake, alert.  Scleral icterus noted.  Chest clear to auscultation bilaterally.  Heart with regular rate and rhythm.  Abdomen soft, positive bowel sounds, mild generalized tenderness to palpation, more so epigastric region; no lower extremity edema  Imaging: No results found.  Labs:  CBC: Recent Labs    11/19/17 1333 11/23/17 1305 12/01/17 1154 12/07/17 1310  WBC 12.4* 11.7* 11.5* 11.7*  HGB 9.0* 9.0* 9.0* 9.0*  HCT 26.1* 26.6* 28.0* 29.5*  PLT 221 364 305 196    COAGS: Recent Labs    11/15/17 1113 11/15/17 1706 12/07/17 1310  INR 0.91 0.95 1.07  APTT 27  --   --     BMP: Recent Labs    11/18/17 0353 11/19/17 0421 11/23/17 1305 12/01/17 1154  NA 132* 129* 132* 134*  K 3.7 3.4* 3.8 4.3  CL 99 95* 95* 98  CO2 24 24 27 27   GLUCOSE 157* 98 111* 108*  BUN 8 11 12 13   CALCIUM 8.3* 8.0* 8.9 9.1  CREATININE 0.31* 0.44 0.60 0.69  GFRNONAA >60 >60 >60 >60  GFRAA >60 >60 >60 >60    LIVER FUNCTION TESTS: Recent Labs    11/18/17 0353 11/19/17 0421 11/23/17 1305 12/01/17 1154  BILITOT 9.9* 7.3* 4.3* 3.0*  AST 153* 108* 95* 82*  ALT 130* 95* 66* 44  ALKPHOS 1,066* 912* 1,330* 1,138*  PROT 5.2* 5.0* 5.9* 6.2*  ALBUMIN 2.1* 2.2* 2.3* 2.4*    Assessment and Plan: Pt with remote  history of right breast cancer with prior mastectomy and chemotherapy.  She has been recently diagnosed with metastatic cholangiocarcinoma and has undergone common bile duct/left hepatic duct stenting on 11/17/2017. She presents today for Port-A-Cath placement for planned treatment.Risks and benefits of image guided port-a-catheter placement was discussed with the patient including, but not limited to bleeding, infection, pneumothorax, or fibrin sheath development and need for additional procedures.  All of the patient's questions were answered, patient is agreeable to proceed. Consent signed and in chart.     Electronically Signed: D. Rowe Robert, PA-C 12/07/2017, 2:00 PM   I spent a total of 20 minutes at the the patient's bedside AND on the patient's hospital floor or unit, greater than 50% of which was counseling/coordinating care for Port-A-Cath placement

## 2017-12-07 NOTE — Procedures (Signed)
Interventional Radiology Procedure Note  Procedure: Placement of a right IJ approach single lumen PowerPort.  Tip is positioned at the superior cavoatrial junction and catheter is ready for immediate use.  Complications: None Recommendations:  - Ok to shower tomorrow - Do not submerge for 7 days - Routine line care   Signed,  Madalene Mickler S. Charish Schroepfer, DO   

## 2017-12-08 ENCOUNTER — Inpatient Hospital Stay: Payer: BLUE CROSS/BLUE SHIELD

## 2017-12-08 ENCOUNTER — Inpatient Hospital Stay: Payer: BLUE CROSS/BLUE SHIELD | Admitting: Nurse Practitioner

## 2017-12-10 ENCOUNTER — Other Ambulatory Visit: Payer: Self-pay | Admitting: *Deleted

## 2017-12-10 MED ORDER — LORAZEPAM 0.5 MG PO TABS: 0.5000 mg | ORAL_TABLET | Freq: Two times a day (BID) | ORAL | 0 refills | Status: AC | PRN

## 2017-12-13 ENCOUNTER — Encounter: Payer: Self-pay | Admitting: Hematology

## 2017-12-13 NOTE — Progress Notes (Signed)
Calledpt to introduce myself as her Arboriculturist and to discuss copay assistance.  Pt was unavailable so I spoke to her husband and he gave me consent to apply in her behalf so I enrolled her in the [place] $15,000 for 12 months from 12/13/17.Pt will pay $0 for her first treatment and $0 for each subsequent treatment (up to $7,200 per treatment).  I informed him of the [place], went over what it covers and gave him the income requirement.  He stated they are well over the requirement so she doesn't qualify at this time.  I will give her my card at her next visit to contact me if she has any questions or concerns in the future.

## 2017-12-14 NOTE — Progress Notes (Deleted)
[name]  Telephone:([phone]) [phone] [phone]  Clinic Follow up Note   Patient Care Team: Chesley Noon, MD as PCP - General (Family Medicine) Annia Belt, MD as Consulting Physician (Oncology) Clarene Essex, MD as Consulting Physician (Gastroenterology) Magrinat, Virgie Dad, MD as Consulting Physician (Oncology) Selinda Orion as Physician Assistant (Physician Assistant) 12/14/2017  SUMMARY OF ONCOLOGIC HISTORY: Oncology History   Cancer Staging No matching staging information was found for the patient.       Malignant neoplasm of overlapping sites of right breast in female, estrogen receptor positive ([place])   1999 Surgery    status post right mastectomy 1999 with immediate reconstruction, total 9 right axillary lymph nodes removed     04/1998 - 12/2008 Anti-estrogen oral therapy    status post tamoxifen April 2000 through December 2009, then letrozole for 1 year    2000 -  Chemotherapy    status post cyclophosphamide and doxorubicin x4 adjuvantly    11/11/2017 Initial Diagnosis    Malignant neoplasm of overlapping sites of right breast in female, estrogen receptor positive ([place]) - pT1b pN1, 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 Estrogen Receptor: 0%, NEGATIVE Progesterone Receptor: 0%, NEGATIVE Proliferation Marker Ki67: 10%    11/15/2017 Pathology Results    Diagnosis Liver, needle/core biopsy - ADENOCARCINOMA, SEE COMMENT    11/15/2017 Imaging    11/15/2017 CT CAP IMPRESSION: 1. Hepatic metastatic disease with innumerable liver lesions, biopsied earlier today. No evidence of post biopsy hemorrhage or complication. 2. Intrahepatic and proximal extrahepatic biliary ductal dilatation, abrupt transition proximally in the common duct with associated soft tissue density,  which may represent adenopathy but is not well delineated. Ill-defined soft tissue density in the porta hepatis and portacaval region is likely adenopathy. This is contiguous with the uncinate process of the pancreas, focal pancreatic lesion difficult to exclude. No pancreatic ductal dilatation. 3. Attenuated portal vein at the portal splenic confluence, possibly occluded with suspected portal collaterals. Portal vein is otherwise patent. 4. Osseous metastatic disease with primarily sclerotic lesions, primarily involving the spine. Lesions involve T8, T9, T10, T11, and L1. Indeterminate lesion at T4 versus hemangioma. T10 lesion involves the entire vertebral body, with heterogeneous appearance of the lamina and possible extraosseous component and mass-effect on the spinal canal. Recommend thoracic spine MRI to evaluate for cord compression. 5. Nonspecific small pulmonary nodules, largest measuring 4 mm in the left upper lobe. 6. Small amount of perihepatic and pelvic ascites. 7. Aortic Atherosclerosis (ICD10-I70.0). Coronary artery calcifications.     11/15/2017 Imaging     11/15/2017 US Liver IMPRESSION: Ultrasound-guided core biopsy performed of 2 adjacent solid hypoechoic lesions within the left lobe of the liver. Innumerable lesions are seen throughout the liver parenchyma as well as evidence of biliary obstruction with dilated intrahepatic ducts, particularly in the left lobe. Recommend eventual correlation with CT of the chest, abdomen and pelvis for further staging.    11/16/2017 Imaging    11/16/2017 MRI Thoracic Spine IMPRESSION: 1. Multiple osseous metastasis (T5, T7, T8, T9, T10, T12 and L1), known T10 pathologic fracture better seen on yesterday's CT. 2. Epidural tumoral extension from T8-9 to T10-11 resulting in moderate to severe canal stenosis at T10, no spinal cord edema.    11/16/2017 Imaging    11/16/2017 MRI Abdomen IMPRESSION: Diffuse liver  metastases.  Diffuse biliary  ductal dilatation due to 2.1 cm soft tissue mass in the porta hepatis and involving the superior margin of the pancreatic head. Differential diagnosis includes primary pancreatic carcinoma, cholangiocarcinoma, and metastatic lymphadenopathy. Consider ERCP for further evaluation.     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[place])    12/07/2017 -  Chemotherapy    The patient had palonosetron (ALOXI) injection 0.25 mg, 0.25 mg, Intravenous,  Once, 0 of 4 cycles pegfilgrastim-cbqv (UDENYCA) injection 6 mg, 6 mg, Subcutaneous, Once, 0 of 4 cycles CISplatin (PLATINOL) 45 mg in sodium chloride 0.9 % 250 mL chemo infusion, 25 mg/m2, Intravenous,  Once, 0 of 4 cycles gemcitabine (GEMZAR) 1,786 mg in sodium chloride 0.9 % 100 mL chemo infusion, 1,000 mg/m2, Intravenous,  Once, 0 of 4 cycles fosaprepitant (EMEND) 150 mg, dexamethasone (DECADRON) 12 mg in sodium chloride 0.9 % 145 mL IVPB, , Intravenous,  Once, 0 of 4 cycles  for chemotherapy treatment.    CURRENT THERAPY  Pending first line chemo with cisplatin and gemcitabine, 2 weeks on, 1 week off.   INTERVAL HISTORY: Please see below for problem oriented charting.  REVIEW OF SYSTEMS:   Constitutional: Denies fevers, chills or abnormal weight loss Eyes: Denies blurriness of vision Ears, nose, mouth, throat, and face: Denies mucositis or sore throat Respiratory: Denies cough, dyspnea or wheezes Cardiovascular: Denies palpitation, chest discomfort or lower extremity swelling Gastrointestinal:  Denies nausea, heartburn or change in bowel habits Skin: Denies abnormal skin rashes Lymphatics: Denies new lymphadenopathy or easy bruising Neurological:Denies numbness, tingling or new weaknesses Behavioral/Psych: Mood is stable, no new changes  All other systems were reviewed with the patient and are negative.  MEDICAL HISTORY:  Past Medical  History:  Diagnosis Date  . Breast cancer ([place])   . Breast cancer, stage 2 ([place]) 04/05/2011   Mestatic to liver,bones, possible gallbladder  . Family history of lung cancer   . HTN (hypertension), benign 04/05/2011  . Hyperglycemia without ketosis 04/05/2011  . IBD (inflammatory bowel disease) 04/05/2011    SURGICAL HISTORY: Past Surgical History:  Procedure Laterality Date  . ABDOMINAL HYSTERECTOMY    . AUGMENTATION MAMMAPLASTY Bilateral   . BILIARY STENT PLACEMENT  11/17/2017   Procedure: BILIARY STENT PLACEMENT;  Surgeon: Arta Silence, MD;  Location: [facility] ENDOSCOPY;  Service: Endoscopy;;  . COLONOSCOPY    . ERCP N/A 11/17/2017   Procedure: ENDOSCOPIC RETROGRADE CHOLANGIOPANCREATOGRAPHY (ERCP);  Surgeon: Arta Silence, MD;  Location: [facility] ENDOSCOPY;  Service: Endoscopy;  Laterality: N/A;  . ESOPHAGOGASTRODUODENOSCOPY (EGD) WITH PROPOFOL N/A 11/26/2017   Procedure: ESOPHAGOGASTRODUODENOSCOPY (EGD) WITH PROPOFOL;  Surgeon: Arta Silence, MD;  Location: [place];  Service: Endoscopy;  Laterality: N/A;  . EUS N/A 11/26/2017   Procedure: FULL UPPER ENDOSCOPIC ULTRASOUND (EUS) RADIAL;  Surgeon: Arta Silence, MD;  Location: [place];  Service: Endoscopy;  Laterality: N/A;  . IR IMAGING GUIDED PORT INSERTION  12/07/2017  . KNEE SURGERY Right    ACL   . MASTECTOMY Right   . SPHINCTEROTOMY  11/17/2017   Procedure: SPHINCTEROTOMY;  Surgeon: Arta Silence, MD;  Location: [facility] ENDOSCOPY;  Service: Endoscopy;;    I have reviewed the social history and family history with the patient and they are unchanged from previous note.  ALLERGIES:  has No Known Allergies.  MEDICATIONS:  Current Outpatient Medications  Medication Sig Dispense Refill  . FLUoxetine (PROZAC) 10 MG capsule Take 10 mg by mouth daily as needed (depression).     Marland Kitchen ibuprofen (  ADVIL,MOTRIN) 400 MG tablet Take 1 tablet (400 mg total) by mouth every 6 (six) hours as needed (back pain). 30 tablet 0  .  lidocaine-prilocaine (EMLA) cream Apply to affected area once 30 g 3  . LORazepam (ATIVAN) 0.5 MG tablet Take 1 tablet (0.5 mg total) by mouth 2 (two) times daily as needed for anxiety or sleep. 30 tablet 0  . losartan (COZAAR) 100 MG tablet Take 100 mg by mouth at bedtime.    Marland Kitchen morphine (MS CONTIN) 15 MG 12 hr tablet Take 1 tablet (15 mg total) by mouth every 12 (twelve) hours. 60 tablet 0  . ondansetron (ZOFRAN) 8 MG tablet Take 1 tablet (8 mg total) by mouth 2 (two) times daily as needed. Start on the third day after chemotherapy. (Patient taking differently: Take 8 mg by mouth 2 (two) times daily as needed for nausea or vomiting. Start on the third day after chemotherapy.) 30 tablet 1  . oxyCODONE (OXY IR/ROXICODONE) 5 MG immediate release tablet Take 1 tablet (5 mg total) by mouth every 4 (four) hours as needed for moderate pain. 60 tablet 0  . pantoprazole (PROTONIX) 40 MG tablet Take 1 tablet (40 mg total) by mouth daily. (Patient taking differently: Take 40 mg by mouth daily as needed (acid reflux). ) 30 tablet 0  . polyethylene glycol (MIRALAX / GLYCOLAX) packet Take 17 g by mouth daily. (Patient taking differently: Take 17 g by mouth daily as needed for mild constipation. ) 14 each 0  . promethazine (PHENERGAN) 25 MG tablet Take 1 tablet (25 mg total) by mouth every 6 (six) hours as needed for nausea or vomiting. 20 tablet 0  . senna (SENOKOT) 8.6 MG TABS tablet Take 1 tablet by mouth daily as needed for mild constipation.    . sucralfate (CARAFATE) 1 g tablet Crush 1 tab and mix in 1 oz water 12 qAC and HS 120 tablet 1   No current facility-administered medications for this visit.     PHYSICAL EXAMINATION: ECOG PERFORMANCE STATUS: {CHL ONC ECOG [phone]}  There were no vitals filed for this visit. There were no vitals filed for this visit.  GENERAL:alert, no distress and comfortable SKIN: skin color, texture, turgor are normal, no rashes or significant lesions EYES: normal,  Conjunctiva are pink and non-injected, sclera clear OROPHARYNX:no exudate, no erythema and lips, buccal mucosa, and tongue normal  NECK: supple, thyroid normal size, non-tender, without nodularity LYMPH:  no palpable lymphadenopathy in the cervical, axillary or inguinal LUNGS: clear to auscultation and percussion with normal breathing effort HEART: regular rate & rhythm and no murmurs and no lower extremity edema ABDOMEN:abdomen soft, non-tender and normal bowel sounds Musculoskeletal:no cyanosis of digits and no clubbing  NEURO: alert & oriented x 3 with fluent speech, no focal motor/sensory deficits  LABORATORY DATA:  I have reviewed the data as listed CBC Latest Ref Rng & Units 12/07/2017 12/01/2017 11/23/2017  WBC 4.0 - 10.5 K/uL 11.7(H) 11.5(H) 11.7(H)  Hemoglobin 12.0 - 15.0 g/dL 9.0(L) 9.0(L) 9.0(L)  Hematocrit 36.0 - 46.0 % 29.5(L) 28.0(L) 26.6(L)  Platelets 150 - 400 K/uL 196 305 364     CMP Latest Ref Rng & Units 12/01/2017 11/23/2017 11/19/2017  Glucose 70 - 99 mg/dL 108(H) 111(H) 98  BUN 6 - 20 mg/dL 13 12 11   Creatinine 0.44 - 1.00 mg/dL 0.69 0.60 0.44  Sodium 135 - 145 mmol/L 134(L) 132(L) 129(L)  Potassium 3.5 - 5.1 mmol/L 4.3 3.8 3.4(L)  Chloride 98 - 111 mmol/L 98 95(L)  95(L)  CO2 22 - 32 mmol/L 27 27 24   Calcium 8.9 - 10.3 mg/dL 9.1 8.9 8.0(L)  Total Protein 6.5 - 8.1 g/dL 6.2(L) 5.9(L) 5.0(L)  Total Bilirubin 0.3 - 1.2 mg/dL 3.0(H) 4.3(HH) 7.3(H)  Alkaline Phos 38 - 126 U/L 1,138(H) 1,330(H) 912(H)  AST 15 - 41 U/L 82(H) 95(H) 108(H)  ALT 0 - 44 U/L 44 66(H) 95(H)      RADIOGRAPHIC STUDIES: I have personally reviewed the radiological images as listed and agreed with the findings in the report. No results found.   ASSESSMENT & PLAN: Caitlin Pollard is a 56 y.o. female with history of  1. 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 HTN 3. Depression and Anxiety 4. Back pain  5.  Urinary Frequency 6. Goal of care discussion     PLAN No problem-specific Assessment & Plan notes found for this encounter.   No orders of the defined types were placed in this encounter.  All questions were answered. The patient knows to call the clinic with any problems, questions or concerns. No barriers to learning was detected. I spent {CHL ONC TIME VISIT - [id number]} counseling the patient face to face. The total time spent in the appointment was {CHL ONC TIME VISIT - VHSJW:[id number]} and more than 50% was on counseling and review of test results     Alla Feeling, NP 12/14/17

## 2017-12-15 ENCOUNTER — Inpatient Hospital Stay: Payer: BLUE CROSS/BLUE SHIELD

## 2017-12-15 ENCOUNTER — Inpatient Hospital Stay: Payer: BLUE CROSS/BLUE SHIELD | Admitting: Nurse Practitioner

## 2017-12-27 ENCOUNTER — Ambulatory Visit: Payer: BLUE CROSS/BLUE SHIELD

## 2017-12-27 ENCOUNTER — Telehealth: Payer: Self-pay | Admitting: Emergency Medicine

## 2017-12-27 NOTE — Telephone Encounter (Signed)
-----   Message from Jonnie Finner, RN sent at 12/27/2017  3:34 PM EST ----- She is in a clinical trial at [facility] clinic.  No action required.   [name]  ----- Message ----- From: Hughie Closs, LPN Sent: 11/0/3159   3:24 PM EST To: Jonnie Finner, RN  [name], Do you know anything about this?  Tanaia Hawkey ----- Message ----- From: Truitt Merle, MD Sent: 12/16/2017   3:17 PM EST To: Jonnie Finner, RN, Alla Feeling, NP, #  I believe [name] has spoken with her husband last week, what was the plan, [name]?  Krista Blue  ----- Message ----- From: Alla Feeling, NP Sent: 12/16/2017   2:19 PM EST To: Hughie Closs, LPN, [facility]  Caitlin Pollard,   Can you please call the patient. She missed her apt yesterday, but it appears she's been enrolled in clinical trial at [facility] clinic. Please offer her lab and f/u with Dr. Burr Medico in next few weeks or when she will be back in town.   Thanks, Regan Rakers

## 2017-12-30 NOTE — Progress Notes (Signed)
  Radiation Oncology         [id number]) [phone] ________________________________  Name: Caitlin Pollard MRN: [id number]  Date: 12/01/2017  DOB: 05/27/1960  End of Treatment Note  Diagnosis:   57 y.o. female with Probable Stage IV GI malignancy     Indication for treatment:  Palliative       Radiation treatment dates:   11/18/2017 - 12/01/2017  Site/dose:   Thoracic-Lumbar Spine (T8-L1) / 30 Gy in 10 fractions  Beams/energy:   Isodose Plan / 10X, 15X Photon  Narrative: The patient tolerated radiation treatment relatively well.   She experienced moderate fatigue and a slight cough but otherwise denied any significant difficulties.  Plan: The patient has completed radiation treatment. The patient will return to radiation oncology clinic for routine followup in one month. I advised them to call or return sooner if they have any questions or concerns related to their recovery or treatment.  ------------------------------------------------  Jodelle Gross, MD, PhD  This document serves as a record of services personally performed by Kyung Rudd, MD. It was created on his behalf by Rae Lips, a trained medical scribe. The creation of this record is based on the scribe's personal observations and the provider's statements to them. This document has been checked and approved by the attending provider.

## 2018-01-03 ENCOUNTER — Ambulatory Visit
Admission: RE | Admit: 2018-01-03 | Discharge: 2018-01-03 | Disposition: A | Discharge status: Home / Self Care | Payer: BLUE CROSS/BLUE SHIELD | Source: Ambulatory Visit | Attending: Radiation Oncology | Admitting: Radiation Oncology

## 2018-01-03 ENCOUNTER — Other Ambulatory Visit: Payer: Self-pay

## 2018-01-03 ENCOUNTER — Encounter: Payer: Self-pay | Admitting: Radiation Oncology

## 2018-01-03 VITALS — BP 68/48 | HR 85 | Temp 98.0°F | Resp 18 | Ht 67.0 in | Wt 130.4 lb

## 2018-01-03 DIAGNOSIS — C221 Intrahepatic bile duct carcinoma: Secondary | ICD-10-CM

## 2018-01-03 DIAGNOSIS — I959 Hypotension, unspecified: Secondary | ICD-10-CM | POA: Diagnosis not present

## 2018-01-03 DIAGNOSIS — Z79899 Other long term (current) drug therapy: Secondary | ICD-10-CM | POA: Diagnosis not present

## 2018-01-03 DIAGNOSIS — C50919 Malignant neoplasm of unspecified site of unspecified female breast: Secondary | ICD-10-CM

## 2018-01-03 DIAGNOSIS — M4854XA Collapsed vertebra, not elsewhere classified, thoracic region, initial encounter for fracture: Secondary | ICD-10-CM | POA: Diagnosis not present

## 2018-01-03 DIAGNOSIS — C7951 Secondary malignant neoplasm of bone: Secondary | ICD-10-CM

## 2018-01-03 NOTE — Progress Notes (Signed)
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 She is doing better with pain relief since completing radiotherapy but has not had complete resolution of her symptoms. She still has pain deep in the mid spine. She continues to take morphine as needed. She also continues her antihypertensive with Losartan at 100 mg. She has lost about 25 pounds in the past few months and reports she's quite fatigued. Her recent blood work from [facility] clinic where she's under the care of Dr. Benjie Karvonen was evaluated and stable, likely noncontributory to her symptoms. She is currently enrolled in a clinical trial, targeting the FGFR2 receptor.                             ALLERGIES:  has No Known Allergies.  Meds: Current Outpatient Medications  Medication Sig Dispense Refill  . FLUoxetine (PROZAC) 10 MG capsule Take 10 mg by mouth daily as needed (depression).     Marland Kitchen ibuprofen (ADVIL,MOTRIN) 400 MG tablet Take 1 tablet (400 mg total) by mouth every 6 (six) hours as needed (back pain). 30 tablet 0  . lidocaine-prilocaine (EMLA) cream Apply to affected area once 30 g 3  . LORazepam (ATIVAN) 0.5 MG tablet Take 1 tablet (0.5 mg total) by mouth 2 (two) times daily as needed for anxiety or sleep. 30 tablet 0  . losartan (COZAAR) 100 MG tablet Take 100 mg by mouth at bedtime.    Marland Kitchen morphine (MS CONTIN) 15 MG 12 hr tablet Take 1 tablet (15 mg total) by mouth every 12 (twelve) hours. 60 tablet 0  . NON FORMULARY 13.5 mg once.    Marland Kitchen oxyCODONE (OXY IR/ROXICODONE) 5 MG immediate release tablet Take 1  tablet (5 mg total) by mouth every 4 (four) hours as needed for moderate pain. 60 tablet 0  . polyethylene glycol (MIRALAX / GLYCOLAX) packet Take 17 g by mouth daily. (Patient taking differently: Take 17 g by mouth daily as needed for mild constipation. ) 14 each 0  . senna (SENOKOT) 8.6 MG TABS tablet Take 1 tablet by mouth daily as needed for mild constipation.    . ondansetron (ZOFRAN) 8 MG tablet Take 1 tablet (8 mg total) by mouth 2 (two) times daily as needed. Start on the third day after chemotherapy. (Patient not taking: Reported on 01/03/2018) 30 tablet 1  . pantoprazole (PROTONIX) 40 MG tablet Take 1 tablet (40 mg total) by mouth daily. (Patient taking differently: Take 40 mg by mouth daily as needed (acid reflux). ) 30 tablet 0  . promethazine (PHENERGAN) 25 MG tablet Take 1 tablet (25 mg total) by mouth every 6 (six) hours as needed for nausea or vomiting. (Patient not taking: Reported on 01/03/2018) 20 tablet 0  . sucralfate (CARAFATE) 1 g tablet Crush 1 tab and mix in 1 oz water 12 qAC and HS (Patient not taking: Reported on 01/03/2018) 120 tablet 1   No current facility-administered medications for this encounter.     Physical Findings:  height is 5' 7"  (1.702 m)  and weight is 130 lb 6.4 oz (59.1 kg). Her oral temperature is 98 F (36.7 C). Her blood pressure is 68/48 (abnormal) and her pulse is 85. Her respiration is 18 and oxygen saturation is 100%.  Pain Assessment Pain Score: 2  Pain Loc: Back/10 In general this is a well appearing though tired caucasian female in no acute distress. She's alert and oriented x4 and appropriate throughout the examination. Cardiopulmonary assessment is negative for acute distress and she exhibits normal effort.   Lab Findings: Lab Results  Component Value Date   WBC 11.7 (H) 12/07/2017   HGB 9.0 (L) 12/07/2017   HCT 29.5 (L) 12/07/2017   MCV 97.4 12/07/2017   PLT 196 12/07/2017     Radiographic Findings: Ir Imaging Guided Port  Insertion  Result Date: 12/07/2017 INDICATION: 57 year old female with a history of cholangiocarcinoma. She presents for port catheter EXAM: IMPLANTED PORT A CATH PLACEMENT WITH ULTRASOUND AND FLUOROSCOPIC GUIDANCE MEDICATIONS: 2 g Ancef; The antibiotic was administered within an appropriate time interval prior to skin puncture. ANESTHESIA/SEDATION: Moderate (conscious) sedation was employed during this procedure. A total of Versed 2 mg and Fentanyl 100 mcg was administered intravenously. Moderate Sedation Time: 23 minutes. The patient's level of consciousness and vital signs were monitored continuously by radiology nursing throughout the procedure under my direct supervision. FLUOROSCOPY TIME:  0 minutes, 12 seconds (0.7 mGy) COMPLICATIONS: None PROCEDURE: The procedure, risks, benefits, and alternatives were explained to the patient. Questions regarding the procedure were encouraged and answered. The patient understands and consents to the procedure. Ultrasound survey was performed with images stored and sent to PACs. The right neck and chest was prepped with chlorhexidine, and draped in the usual sterile fashion using maximum barrier technique (cap and mask, sterile gown, sterile gloves, large sterile sheet, hand hygiene and cutaneous antiseptic). Antibiotic prophylaxis was provided with 2.0g Ancef administered IV one hour prior to skin incision. Local anesthesia was attained by infiltration with 1% lidocaine without epinephrine. Ultrasound demonstrated patency of the right internal jugular vein, and this was documented with an image. Under real-time ultrasound guidance, this vein was accessed with a 21 gauge micropuncture needle and image documentation was performed. A small dermatotomy was made at the access site with an 11 scalpel. A 0.018" wire was advanced into the SVC and used to estimate the length of the internal catheter. The access needle exchanged for a 1F micropuncture vascular sheath. The 0.018"  wire was then removed and a 0.035" wire advanced into the IVC. An appropriate location for the subcutaneous reservoir was selected below the clavicle and an incision was made through the skin and underlying soft tissues. The subcutaneous tissues were then dissected using a combination of blunt and sharp surgical technique and a pocket was formed. A single lumen power injectable portacatheter was then tunneled through the subcutaneous tissues from the pocket to the dermatotomy and the port reservoir placed within the subcutaneous pocket. The venous access site was then serially dilated and a peel away vascular sheath placed over the wire. The wire was removed and the port catheter advanced into position under fluoroscopic guidance. The catheter tip is positioned in the cavoatrial junction. This was documented with a spot image. The portacatheter was then tested and found to flush and aspirate well. The port was flushed with saline followed by 100 units/mL heparinized saline. The pocket was then closed in two layers using first subdermal inverted interrupted absorbable sutures followed by a running subcuticular suture. The epidermis was then sealed with  Dermabond. The dermatotomy at the venous access site was also seal with Dermabond. Patient tolerated the procedure well and remained hemodynamically stable throughout. No complications encountered and no significant blood loss encountered IMPRESSION: Status post right IJ port catheter placement. Catheter ready for use. Signed, Dulcy Fanny. Dellia Nims, RPVI [facility] Radiology Electronically Signed   By: Corrie Mckusick D.O.   On: 12/07/2017 17:13    Impression/Plan: 1. Metastatic cholangiocarcinoma to the T spine. The patient is doing well since recovering from the effects of radiotherapy. She has had improvement in her pain which we're very happy to hear. We will see her back as needed moving forward, and she will  continue on clinical trial with Dr. Benjie Karvonen at [facility] in medical oncology. She was encouraged to call if she has any concerns regarding her prior therapy or need for referral to address her compression fracture. 2. Hypotension. I think this is most likely due to her antihypertensive in the scenario of being on the same dose as she had been on when she was 25 pounds heavier. She was counseled on stopping this, and I also confirmed this with her medical oncologist Dr. Benjie Karvonen. Her BUN/Creatinine was stable and I don't believe she needs IVF, but if her clinical picture changes, we can reassess. She is in agreement. 3. Compression fracture at T10. I did discuss with Dr. Benjie Karvonen her previous findings and that if the trial would allow for vertebral augmentation she'd want to do so here in [employer]. He will keep the patient informed on the process regarding how this would fit into her trial.      Carola Rhine, PAC

## 2018-01-04 ENCOUNTER — Telehealth: Payer: Self-pay | Admitting: *Deleted

## 2018-01-04 NOTE — Telephone Encounter (Signed)
Received vm call from pt asking about getting strep test d/t having a progressively worsening sore throat.  Returned call & daughter who is a PA was there & just thinks she has sinusitis.  She crushed a carafate for her & after drinking feels a little better.  She will go to PCP if worse.

## 2018-01-06 ENCOUNTER — Ambulatory Visit: Payer: Self-pay | Admitting: Radiation Oncology

## 2018-01-17 ENCOUNTER — Ambulatory Visit
Admission: RE | Admit: 2018-01-17 | Discharge: 2018-01-17 | Disposition: A | Discharge status: Home / Self Care | Payer: BLUE CROSS/BLUE SHIELD | Source: Ambulatory Visit | Attending: Physician Assistant | Admitting: Physician Assistant

## 2018-01-17 ENCOUNTER — Other Ambulatory Visit: Payer: Self-pay | Admitting: Physician Assistant

## 2018-01-17 DIAGNOSIS — C7952 Secondary malignant neoplasm of bone marrow: Principal | ICD-10-CM

## 2018-01-17 DIAGNOSIS — R1900 Intra-abdominal and pelvic swelling, mass and lump, unspecified site: Secondary | ICD-10-CM

## 2018-01-17 DIAGNOSIS — K5903 Drug induced constipation: Secondary | ICD-10-CM

## 2018-01-17 DIAGNOSIS — C799 Secondary malignant neoplasm of unspecified site: Secondary | ICD-10-CM

## 2018-01-17 DIAGNOSIS — C7951 Secondary malignant neoplasm of bone: Secondary | ICD-10-CM | POA: Insufficient documentation

## 2018-01-17 NOTE — Progress Notes (Signed)
  Radiation Oncology         [phone]) [phone] ________________________________  Name: Caitlin Pollard MRN: [id number]  Date: 11/18/2017  DOB: [birth year]  SIMULATION AND TREATMENT PLANNING NOTE  DIAGNOSIS:     ICD-10-CM   1. 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  Identity was confirmed.  All relevant records and images related to the planned course of therapy were reviewed.   Written consent to proceed with treatment was confirmed which was freely given after reviewing the details related to the planned course of therapy had been reviewed with the patient.  Then, the patient was set-up in a stable reproducible  supine position for radiation therapy.  CT images were obtained.  Surface markings were placed.    Medically necessary complex treatment device(s) for immobilization:  Vac-lock bag.   The CT images were loaded into the planning software.  Then the target and avoidance structures were contoured.  Treatment planning then occurred.  The radiation prescription was entered and confirmed.  A total of 3 complex treatment devices were fabricated which relate to the designed radiation treatment fields. Each of these customized fields/ complex treatment devices will be used on a daily basis during the radiation course. I have requested : Isodose Plan.   PLAN:  The patient will receive 30 Gy in 10 fractions.  ________________________________   Jodelle Gross, MD, PhD

## 2018-01-21 ENCOUNTER — Telehealth: Payer: Self-pay

## 2018-01-21 NOTE — Telephone Encounter (Signed)
Patient's husband Caitlin Pollard called stating his wife has been experiencing a lot of bloating which is causing her discomfort.  Saw GI doctor on Monday took x-rays bowels were normal.  Taking stool softener and laxatives having bowel movements, appetite is very poor.    Next f/u with [facility] is 01/28/18, it is 5 weeks today she has been taking the clinical trial medication and 2 weeks ago her CA 19.9 was 9,000.  She no longer is taking Losartan because it was dropping her blood pressure.  BPs currently running 110s/60s.    They are asking for advice from Dr. Burr Medico.   [phone]

## 2018-01-21 NOTE — Telephone Encounter (Signed)
I called back and spoke with her husband [name]. She has abdominal distension, mild dyspnea, is still able to function at home, no fever or other new concerns. We discussed that her tumor marker CA 19.9 has significant drop to since she started the clinical trial medicine, which is a good sign.  She was seen by PA at [facility] earlier this week, abdominal x-ray was unremarkable, the PA did not feel she has enough ascites to be drained.  [name] does not feel her symptoms are concerning enough to bring her in or go to emergency room.  She will continue watching at home, and to follow-up with [facility] next week.  Truitt Merle MD

## 2018-01-28 ENCOUNTER — Other Ambulatory Visit: Payer: Self-pay | Admitting: Gastroenterology

## 2018-01-28 ENCOUNTER — Other Ambulatory Visit (HOSPITAL_COMMUNITY): Payer: Self-pay | Admitting: Gastroenterology

## 2018-01-28 DIAGNOSIS — R180 Malignant ascites: Secondary | ICD-10-CM

## 2018-01-31 ENCOUNTER — Emergency Department (HOSPITAL_COMMUNITY): Payer: BLUE CROSS/BLUE SHIELD

## 2018-01-31 ENCOUNTER — Inpatient Hospital Stay: Admission: RE | Admit: 2018-01-31 | Payer: Self-pay | Source: Ambulatory Visit | Admitting: Radiation Oncology

## 2018-01-31 ENCOUNTER — Encounter (HOSPITAL_COMMUNITY): Payer: Self-pay | Admitting: *Deleted

## 2018-01-31 ENCOUNTER — Emergency Department (HOSPITAL_COMMUNITY)
Admission: EM | Admit: 2018-01-31 | Discharge: 2018-01-31 | Disposition: A | Discharge status: Home / Self Care | Payer: BLUE CROSS/BLUE SHIELD | Attending: Emergency Medicine | Admitting: Emergency Medicine

## 2018-01-31 ENCOUNTER — Other Ambulatory Visit (HOSPITAL_COMMUNITY): Payer: BLUE CROSS/BLUE SHIELD

## 2018-01-31 ENCOUNTER — Telehealth: Payer: Self-pay | Admitting: Hematology

## 2018-01-31 DIAGNOSIS — R202 Paresthesia of skin: Secondary | ICD-10-CM | POA: Insufficient documentation

## 2018-01-31 DIAGNOSIS — Z853 Personal history of malignant neoplasm of breast: Secondary | ICD-10-CM | POA: Diagnosis not present

## 2018-01-31 DIAGNOSIS — R188 Other ascites: Secondary | ICD-10-CM | POA: Insufficient documentation

## 2018-01-31 DIAGNOSIS — R0602 Shortness of breath: Secondary | ICD-10-CM | POA: Diagnosis not present

## 2018-01-31 DIAGNOSIS — C787 Secondary malignant neoplasm of liver and intrahepatic bile duct: Secondary | ICD-10-CM | POA: Diagnosis not present

## 2018-01-31 DIAGNOSIS — I10 Essential (primary) hypertension: Secondary | ICD-10-CM | POA: Insufficient documentation

## 2018-01-31 DIAGNOSIS — R109 Unspecified abdominal pain: Secondary | ICD-10-CM

## 2018-01-31 DIAGNOSIS — C7951 Secondary malignant neoplasm of bone: Secondary | ICD-10-CM | POA: Diagnosis not present

## 2018-01-31 DIAGNOSIS — Z87891 Personal history of nicotine dependence: Secondary | ICD-10-CM | POA: Insufficient documentation

## 2018-01-31 LAB — CBC WITH DIFFERENTIAL/PLATELET
Abs Immature Granulocytes: 0.19 10*3/uL — ABNORMAL HIGH (ref 0.00–0.07)
BASOS ABS: 0.1 10*3/uL (ref 0.0–0.1)
BASOS PCT: 0 %
EOS ABS: 0.1 10*3/uL (ref 0.0–0.5)
Eosinophils Relative: 0 %
HCT: 35.2 % — ABNORMAL LOW (ref 36.0–46.0)
Hemoglobin: 11.7 g/dL — ABNORMAL LOW (ref 12.0–15.0)
Immature Granulocytes: 1 %
Lymphocytes Relative: 2 %
Lymphs Abs: 0.3 10*3/uL — ABNORMAL LOW (ref 0.7–4.0)
MCH: 28.7 pg (ref 26.0–34.0)
MCHC: 33.2 g/dL (ref 30.0–36.0)
MCV: 86.3 fL (ref 80.0–100.0)
Monocytes Absolute: 1.7 10*3/uL — ABNORMAL HIGH (ref 0.1–1.0)
Monocytes Relative: 11 %
NEUTROS PCT: 86 %
NRBC: 0 % (ref 0.0–0.2)
Neutro Abs: 13 10*3/uL — ABNORMAL HIGH (ref 1.7–7.7)
PLATELETS: 227 10*3/uL (ref 150–400)
RBC: 4.08 MIL/uL (ref 3.87–5.11)
RDW: 17.2 % — AB (ref 11.5–15.5)
WBC: 15.3 10*3/uL — AB (ref 4.0–10.5)

## 2018-01-31 LAB — COMPREHENSIVE METABOLIC PANEL
ALBUMIN: 2 g/dL — AB (ref 3.5–5.0)
ALT: 40 U/L (ref 0–44)
ANION GAP: 9 (ref 5–15)
AST: 99 U/L — AB (ref 15–41)
Alkaline Phosphatase: 1139 U/L — ABNORMAL HIGH (ref 38–126)
BILIRUBIN TOTAL: 3.4 mg/dL — AB (ref 0.3–1.2)
BUN: 12 mg/dL (ref 6–20)
CHLORIDE: 98 mmol/L (ref 98–111)
CO2: 20 mmol/L — ABNORMAL LOW (ref 22–32)
Calcium: 8.1 mg/dL — ABNORMAL LOW (ref 8.9–10.3)
Creatinine, Ser: 0.76 mg/dL (ref 0.44–1.00)
GFR calc Af Amer: >60 mL/min (ref >60)
GFR calc non Af Amer: >60 mL/min (ref >60)
GLUCOSE: 114 mg/dL — AB (ref 70–99)
POTASSIUM: 4.6 mmol/L (ref 3.5–5.1)
SODIUM: 127 mmol/L — AB (ref 135–145)
TOTAL PROTEIN: 4.9 g/dL — AB (ref 6.5–8.1)

## 2018-01-31 LAB — PROTIME-INR
INR: 1.19
Prothrombin Time: 15 seconds (ref 11.4–15.2)

## 2018-01-31 LAB — LIPASE, BLOOD: LIPASE: 43 U/L (ref 11–51)

## 2018-01-31 LAB — BODY FLUID CELL COUNT WITH DIFFERENTIAL
Lymphs, Fluid: 46 %
Monocyte-Macrophage-Serous Fluid: 46 % — ABNORMAL LOW (ref 50–90)
Neutrophil Count, Fluid: 8 % (ref 0–25)
WBC FLUID: 42 uL (ref 0–1000)

## 2018-01-31 LAB — AMYLASE, PLEURAL OR PERITONEAL FLUID: Amylase, Fluid: 12 U/L

## 2018-01-31 LAB — GLUCOSE, PLEURAL OR PERITONEAL FLUID: Glucose, Fluid: 101 mg/dL

## 2018-01-31 LAB — GRAM STAIN

## 2018-01-31 LAB — PROTEIN, PLEURAL OR PERITONEAL FLUID: Total protein, fluid: <3 g/dL

## 2018-01-31 LAB — LACTATE DEHYDROGENASE, PLEURAL OR PERITONEAL FLUID: LD, Fluid: 120 U/L — ABNORMAL HIGH (ref 3–23)

## 2018-01-31 LAB — ALBUMIN, PLEURAL OR PERITONEAL FLUID: Albumin, Fluid: 1.1 g/dL

## 2018-01-31 MED ORDER — FENTANYL CITRATE (PF) 100 MCG/2ML IJ SOLN
50.0000 ug | Freq: Once | INTRAMUSCULAR | Status: AC
  Administered 2018-01-31: 50 ug via INTRAVENOUS
  Filled 2018-01-31: qty 2

## 2018-01-31 MED ORDER — HYDROMORPHONE HCL 1 MG/ML IJ SOLN
1.0000 mg | Freq: Once | INTRAMUSCULAR | Status: AC
  Administered 2018-01-31: 1 mg via INTRAVENOUS
  Filled 2018-01-31: qty 1

## 2018-01-31 MED ORDER — SODIUM CHLORIDE 0.9 % IV BOLUS
1000.0000 mL | Freq: Once | INTRAVENOUS | Status: AC
  Administered 2018-01-31: 1000 mL via INTRAVENOUS

## 2018-01-31 MED ORDER — IOHEXOL 300 MG/ML  SOLN
100.0000 mL | Freq: Once | INTRAMUSCULAR | Status: AC | PRN
  Administered 2018-01-31: 100 mL via INTRAVENOUS

## 2018-01-31 NOTE — ED Notes (Signed)
Pt returned from ultrasound, no distress noted at this time.

## 2018-01-31 NOTE — ED Notes (Signed)
Patient Alert and oriented to baseline. Stable and ambulatory to baseline. Patient verbalized understanding of the discharge instructions.  Patient belongings were taken by the patient.   

## 2018-01-31 NOTE — ED Notes (Signed)
Patient transported to US 

## 2018-01-31 NOTE — Progress Notes (Signed)
Spoke to [employer] to inform her that neither IR nor [place] would be able to get the para done. RN said she would let the ordering physician know.  The patient already had a scheduled appointment for 02/01/2018 at 42 for Para.  I told the RN that we would hold that time slot for patient.  [facility]

## 2018-01-31 NOTE — ED Provider Notes (Signed)
Emergency Department Provider Note   I have reviewed the triage vital signs and the nursing notes.   HISTORY  Chief Complaint Abnormal Lab and Ascites   HPI Caitlin Pollard is a 58 y.o. female with a history of cholangiocarcinoma, currently on a clinical trial treatment at [facility], that presents to the emergency department today secondary to worsening abdominal pain, shortness of breath.  States this is been progressively worsening for the last month and her doctors have been putting off doing a paracentesis until tomorrow.  Also has a questionable portal vein thrombosis so scheduled for ultrasound. No other associated or modifying symptoms.    Past Medical History:  Diagnosis Date  . Breast cancer ([place])   . Breast cancer, stage 2 ([place]) 04/05/2011   Mestatic to liver,bones, possible gallbladder  . Family history of lung cancer   . HTN (hypertension), benign 04/05/2011  . Hyperglycemia without ketosis 04/05/2011  . IBD (inflammatory bowel disease) 04/05/2011    Patient Active Problem List   Diagnosis Date Noted  . 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 Genetic testing 12/06/2017  . Personal history of breast cancer 11/23/2017  . Goals of care, counseling/discussion 11/23/2017  . Family history of lung cancer   . 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 Obstructive jaundice   . Right upper quadrant abdominal pain   . Hepatic failure, acute 11/15/2017  . Tobacco abuse disorder 11/11/2017  . Malignant neoplasm of overlapping sites of right breast in female, estrogen receptor positive ([place]) 11/11/2017  . Liver lesion 11/11/2017  . Bronchitis, chronic ([place]) 11/11/2017  . Breast cancer, stage 2 ([place]) 04/05/2011  . IBD (inflammatory bowel disease) 04/05/2011  . HTN (hypertension), benign 04/05/2011  . Hyperglycemia without ketosis 04/05/2011    Past Surgical History:  Procedure Laterality Date  . ABDOMINAL HYSTERECTOMY    .  AUGMENTATION MAMMAPLASTY Bilateral   . BILIARY STENT PLACEMENT  11/17/2017   Procedure: BILIARY STENT PLACEMENT;  Surgeon: Arta Silence, MD;  Location: [facility] ENDOSCOPY;  Service: Endoscopy;;  . COLONOSCOPY    . ERCP N/A 11/17/2017   Procedure: ENDOSCOPIC RETROGRADE CHOLANGIOPANCREATOGRAPHY (ERCP);  Surgeon: Arta Silence, MD;  Location: [facility] ENDOSCOPY;  Service: Endoscopy;  Laterality: N/A;  . ESOPHAGOGASTRODUODENOSCOPY (EGD) WITH PROPOFOL N/A 11/26/2017   Procedure: ESOPHAGOGASTRODUODENOSCOPY (EGD) WITH PROPOFOL;  Surgeon: Arta Silence, MD;  Location: [place];  Service: Endoscopy;  Laterality: N/A;  . EUS N/A 11/26/2017   Procedure: FULL UPPER ENDOSCOPIC ULTRASOUND (EUS) RADIAL;  Surgeon: Arta Silence, MD;  Location: [place];  Service: Endoscopy;  Laterality: N/A;  . IR IMAGING GUIDED PORT INSERTION  12/07/2017  . KNEE SURGERY Right    ACL   . MASTECTOMY Right   . SPHINCTEROTOMY  11/17/2017   Procedure: SPHINCTEROTOMY;  Surgeon: Arta Silence, MD;  Location: [facility] ENDOSCOPY;  Service: Endoscopy;;    Current Outpatient Rx  . Order #: [id number] Class: Historical Med  . Order #: [id number] Class: Historical Med  . Order #: [id number] Class: Normal  . Order #: [id number] Class: Normal  . Order #: [id number] Class: Phone In  . Order #: [id number] Class: Historical Med  . Order #: [id number] Class: Normal  . Order #: [id number] Class: Historical Med  . Order #: [id number] Class: Normal  . Order #: [id number] Class: Normal  . Order #: [id number] Class: Normal  . Order #: [id number] Class: Normal  . Order #: [id number] Class: Historical Med  . Order #: [id number] Class: Normal  . Order #: [id number] Class: Normal    Allergies Patient has no known allergies.  Family History  Problem Relation Age of Onset  . Lung cancer Father        dx 34s, d 53, asbestos exposure  . Heart attack Paternal Uncle     Social History Social History   Tobacco Use  . Smoking status: Former Smoker    Years: 20.00     Last attempt to quit: 2019    Years since quitting: 1.0  . Smokeless tobacco: Never Used  Substance Use Topics  . Alcohol use: Not Currently  . Drug use: Never    Review of Systems  All other systems negative except as documented in the HPI. All pertinent positives and negatives as reviewed in the HPI. ____________________________________________   PHYSICAL EXAM:  VITAL SIGNS: ED Triage Vitals  Enc Vitals Group     BP 01/31/18 1055 112/84     Pulse Rate 01/31/18 1055 94     Resp 01/31/18 1055 18     Temp 01/31/18 1055 98 F (36.7 C)     Temp Source 01/31/18 1055 Oral     SpO2 01/31/18 1055 99 %     Weight --      Height --      Head Circumference --      Peak Flow --      Pain Score 01/31/18 1054 8     Pain Loc --      Pain Edu? --      Excl. in [place]? --     Constitutional: Alert and oriented. Well appearing and in no acute distress. Eyes: Conjunctivae are normal. PERRL. EOMI. Mild scleral icterus. Head: Atraumatic. Nose: No congestion/rhinnorhea. Mouth/Throat: Mucous membranes are moist.  Oropharynx non-erythematous. Neck: No stridor.  No meningeal signs.   Cardiovascular: Normal rate, regular rhythm. Good peripheral circulation. Grossly normal heart sounds.   Respiratory: Normal respiratory effort.  No retractions. Lungs CTAB. Gastrointestinal: Soft and usually tender worse in the right upper quadrant.  Significant distention.  Musculoskeletal: No lower extremity tenderness nor edema. No gross deformities of extremities. Neurologic:  Normal speech and language. No gross focal neurologic deficits are appreciated.  Skin:  Skin is warm, dry and intact.  Mild jaundice.   ____________________________________________   LABS (all labs ordered are listed, but only abnormal results are displayed)  Labs Reviewed  CBC WITH DIFFERENTIAL/PLATELET - Abnormal; Notable for the following components:      Result Value   WBC 15.3 (*)    Hemoglobin 11.7 (*)    HCT 35.2 (*)     RDW 17.2 (*)    Neutro Abs 13.0 (*)    Lymphs Abs 0.3 (*)    Monocytes Absolute 1.7 (*)    Abs Immature Granulocytes 0.19 (*)    All other components within normal limits  COMPREHENSIVE METABOLIC PANEL - Abnormal; Notable for the following components:   Sodium 127 (*)    CO2 20 (*)    Glucose, Bld 114 (*)    Calcium 8.1 (*)    Total Protein 4.9 (*)    Albumin 2.0 (*)    AST 99 (*)    Alkaline Phosphatase 1,139 (*)    Total Bilirubin 3.4 (*)    All other components within normal limits  LACTATE DEHYDROGENASE, PLEURAL OR PERITONEAL FLUID - Abnormal; Notable for the following components:   LD, Fluid 120 (*)    All other components within normal limits  BODY FLUID CELL COUNT WITH DIFFERENTIAL - Abnormal; Notable for the following components:   Color, Fluid YELLOW (*)  Monocyte-Macrophage-Serous Fluid 46 (*)    All other components within normal limits  CULTURE, BODY FLUID-BOTTLE  GRAM STAIN  FUNGUS CULTURE WITH STAIN  GRAM STAIN  PROTIME-INR  LIPASE, BLOOD  GLUCOSE, PLEURAL OR PERITONEAL FLUID  PROTEIN, PLEURAL OR PERITONEAL FLUID  ALBUMIN, PLEURAL OR PERITONEAL FLUID  AMYLASE, PLEURAL OR PERITONEAL FLUID   PH, BODY FLUID  TRIGLYCERIDES, BODY FLUIDS  CYTOLOGY - NON PAP    ____________________________________________  RADIOLOGY  Ct Abdomen Pelvis W Contrast  Result Date: 01/31/2018 CLINICAL DATA:  Increasing abdominal bloating with right-sided abdominal pain for 3 weeks. On chemotherapy for metastatic breast cancer. EXAM: CT ABDOMEN AND PELVIS WITH CONTRAST TECHNIQUE: Multidetector CT imaging of the abdomen and pelvis was performed using the standard protocol following bolus administration of intravenous contrast. CONTRAST:  130mL OMNIPAQUE IOHEXOL 300 MG/ML  SOLN COMPARISON:  Abdominopelvic CT 11/15/2017. Abdominal MRI 11/16/2017. FINDINGS: Lower chest: Enlarging nodules at both lung bases consistent with metastatic disease. These measure up to 5 mm in the  right lower lobe on image 5/4. There is a small dependent left pleural effusion with mild left lower lobe atelectasis. Coronary artery atherosclerosis and a small hiatal hernia are noted. Hepatobiliary: Previously demonstrated widespread hepatic abnormalities are smaller and lower in density, likely treated metastatic disease. The largest focal components measures 7.7 x 4.5 cm posteriorly in the right hepatic lobe on image 18/3 (previously 8.3 x 5.7 cm) and 8.2 x 6.0 cm anteriorly in the right lobe on image 25/3 (previously 10.0 x 6.8 cm). Residual viable tumor is certainly possible. There is mild irregular gallbladder wall thickening without calcified cholelithiasis. Metallic biliary stents are in place with gas in the stents. No intrahepatic pneumobilia or significant biliary dilatation identified. Pancreas: Unremarkable. No pancreatic ductal dilatation or surrounding inflammatory changes. Spleen: Normal in size without focal abnormality. Adrenals/Urinary Tract: Both adrenal glands appear normal. The kidneys appear normal without evidence of urinary tract calculus, suspicious lesion or hydronephrosis. No bladder abnormalities are seen. Stomach/Bowel: No evidence of focal bowel wall thickening, distention or surrounding inflammatory change. Vascular/Lymphatic: Previously demonstrated adenopathy in the porta hepatis has resolved. There are small residual retroperitoneal lymph nodes. There is mild aortic and branch vessel atherosclerosis. The portal, superior mesenteric and splenic veins are patent. Reproductive: The cervix appears stable post hysterectomy. No adnexal mass. Other: There is a large amount of ascites. There is minimal nodularity in the right pericolic gutter (image 71/6). Musculoskeletal: Multifocal blastic osseous metastatic disease again noted in the thoracolumbar spine, similar to previous study and further detailed on separate examination of the lumbar spine. Bilateral breast implants without chest  wall lesion. IMPRESSION: 1. New large amount of ascites throughout the peritoneal cavity, accounting for the abdominal distension. Minimal peritoneal nodularity in the right pericolic gutter, potentially malignant ascites. 2. The hepatic and porta hepatis nodal metastatic disease has improved. Osseous metastatic disease is grossly stable. 3. Increased nodularity at the lung bases, suspicious for progressive metastatic disease. 4. Status post interval biliary stenting. There is air within the lumen of the stent, but not within the intrahepatic bile ducts. Electronically Signed   By: Richardean Sale M.D.   On: 01/31/2018 14:43   Ct L-spine No Charge  Result Date: 01/31/2018 CLINICAL DATA:  Increasing abdominal bloating with right-sided abdominal pain for 3 weeks. On chemotherapy for metastatic breast cancer. EXAM: CT LUMBAR SPINE WITHOUT CONTRAST TECHNIQUE: Multiplanar CT image reconstructions of the lumbar spine were generated from the abdominal CT data obtained concurrently. COMPARISON: Abdominal radiographs 01/17/2018.  Abdominopelvic CT  11/15/2017. FINDINGS: Segmentation: Normal. Alignment: Normal. Vertebrae: Multifocal osseous metastatic disease is again noted with relatively limited involvement in the lumbar spine. There is a sclerotic component within the left aspect of the L1 vertebral body and the left pedicle which has not significantly changed. Prominent osseous metastases are present in the T8, T9 and T10 vertebral bodies and posterior elements, with lesser involvement at T11 and T12. Small sclerotic metastases are seen in the bony pelvis. No pathologic fracture or epidural tumor identified. Paraspinal and other soft tissues: Reported separately on abdominopelvic CT. Disc levels: No large disc herniation, spinal stenosis, nerve root encroachment or epidural tumor. Chronic degenerative disc disease at L5-S1 with mild foraminal narrowing bilaterally. Asymmetric facet hypertrophy on the right at L4-5.  IMPRESSION: 1. Similar multifocal osseous metastatic disease involving the thoracolumbar spine and bony pelvis compared with prior CT from 10 weeks ago. No pathologic fracture or epidural tumor identified. 2. Mild lumbar spondylosis without large disc herniation or significant spinal stenosis. 3. Abdominal findings dictated separately. Electronically Signed   By: Richardean Sale M.D.   On: 01/31/2018 14:50   US Abdomen Limited Ruq  Result Date: 01/31/2018 CLINICAL DATA:  Abdominal pain for 2 days. Biliary stent placement 11/17/2017. Metastatic breast cancer. EXAM: ULTRASOUND ABDOMEN LIMITED RIGHT UPPER QUADRANT COMPARISON:  CT same date. FINDINGS: Gallbladder: 18 mm echogenic focus in the gallbladder lumen demonstrates no shadowing and internal blood flow, consistent with a polyp or metastasis. There is moderate diffuse gallbladder wall thickening to 8 mm. No sonographic Murphy's sign. Common bile duct: Diameter: Not visualized.  Metallic biliary stent in place. Liver: Widespread metastatic disease is present throughout the liver. The liver has nodular contours. Portal vein is patent on color Doppler imaging with normal direction of blood flow towards the liver. Moderate amount of perihepatic ascites noted. IMPRESSION: 1. Widespread metastatic disease to the liver and ascites as demonstrated on earlier CT. 2. The biliary system is not well evaluated by ultrasound given the metallic biliary stent. 3. Nonspecific gallbladder wall thickening with small vascular nodule in the gallbladder lumen, likely a polyp or metastasis. Electronically Signed   By: Richardean Sale M.D.   On: 01/31/2018 19:21    ____________________________________________   PROCEDURES  Procedure(s) performed:   ABDOMINAL PARACENTESIS Date/Time: 02/01/2018 8:16 AM Performed by: Merrily Pew, MD Authorized by: Merrily Pew, MD  Consent: Verbal consent obtained. Written consent obtained. Risks and benefits: risks, benefits and  alternatives were discussed Consent given by: patient and spouse Patient understanding: patient states understanding of the procedure being performed Patient consent: the patient's understanding of the procedure matches consent given Procedure consent: procedure consent matches procedure scheduled Required items: required blood products, implants, devices, and special equipment available Patient identity confirmed: verbally with patient Time out: Immediately prior to procedure a "time out" was called to verify the correct patient, procedure, equipment, support staff and site/side marked as required. Preparation: Patient was prepped and draped in the usual sterile fashion. Local anesthesia used: yes Anesthesia: local infiltration  Anesthesia: Local anesthesia used: yes Local Anesthetic: lidocaine 1% without epinephrine Anesthetic total: 5 mL  Sedation: Patient sedated: no  Patient tolerance: Patient tolerated the procedure well with no immediate complications   ____________________________________________   INITIAL IMPRESSION / ASSESSMENT AND PLAN / ED COURSE  Patient borderline in distress secondary to the large amount of ascites.  Blood pressures were soft and she was tachypneic and could barely move secondary to the not of pain so paracentesis was done in the emergency room.  Patient subsequently still had significant right upper quadrant tenderness and nodularity to the liver.  Suspect this with the tenderness is coming from but with her history we will check a ultrasound to evaluate the portal vein for thrombosis but also to evaluate her gallbladder and other possible abnormalities.  Care transferred pending ultrasound and likely discharge.  Also follow-up labs to make sure that no evidence of infection in the peritoneal fluid. Has been have some paresthesia in her left thigh.  No new metastasis noted to her vertebral bodies however she did have some type of tumor in her spinal canal  previously.  This may have worsened and she probably needs a follow-up MRI.  No cauda equina symptoms at this time so we will wait for couple days after the paracentesis to ensure that this did not help the symptoms and if not she will probably need an MRI.  Pertinent labs & imaging results that were available during my care of the patient were reviewed by me and considered in my medical decision making (see chart for details).  ____________________________________________  FINAL CLINICAL IMPRESSION(S) / ED DIAGNOSES  Final diagnoses:  Ascites  Paresthesia  Abdominal pain     MEDICATIONS GIVEN DURING THIS VISIT:  Medications  sodium chloride 0.9 % bolus 1,000 mL (0 mLs Intravenous Stopped 01/31/18 1252)  fentaNYL (SUBLIMAZE) injection 50 mcg (50 mcg Intravenous Given 01/31/18 1139)  HYDROmorphone (DILAUDID) injection 1 mg (1 mg Intravenous Given 01/31/18 1252)  iohexol (OMNIPAQUE) 300 MG/ML solution 100 mL (100 mLs Intravenous Contrast Given 01/31/18 1349)     NEW OUTPATIENT MEDICATIONS STARTED DURING THIS VISIT:  Discharge Medication List as of 01/31/2018  8:08 PM      Note:  This note was prepared with assistance of Dragon voice recognition software. Occasional wrong-word or sound-a-like substitutions may have occurred due to the inherent limitations of voice recognition software.   Keysha Damewood, Corene Cornea, MD 02/01/18 [phone]

## 2018-01-31 NOTE — Consult Note (Signed)
Referring Provider:  Dr. Dorise Bullion, [facility] EDP Primary Care Physician:  Chesley Noon, MD Primary Gastroenterologist:  Dr. Watt Climes  Reason for Consultation: New onset ascites in the setting of metastatic cholangiocarcinoma  HPI: Caitlin Pollard is a 58 y.o. 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  Endoscopic ultrasound did not show evidence of a pancreatic neoplasm.  There is a remote history of breast cancer as well.  With all that background, the patient had been receiving treatment through the cancer center here, but also has been going to the [facility] [place] [facility] for second opinion regarding treatment.  In recent days, there has been progressive abdominal pain, especially when the patient tries to sit up from a supine position.  There has been some associated swelling of the legs.  The pain became so intense that, at the time of routine blood work at her primary physician's office today, the practitioner there took 1 look at the patient and advised her to come to the emergency room.  An updated CT scan in the emergency room show some regression of the patient's hepatic burden of metastatic disease, but new onset significant large volume ascites.  The CT did not show any evidence of significant biliary dilatation, and her labs show stable elevation of alk phos around 1100 (presumably due to infiltrative disease in the liver), with a bilirubin of 3.4 which is fairly stable for the patient.  Thus, it appears that the 2 biliary stents are working.  An outpatient paracentesis had previously been scheduled for tomorrow but the patient could not make it until then because of the intensity of her abdominal pain.  There is no problem with significant dyspnea.   Past Medical History:  Diagnosis Date  . Breast cancer ([place])   .  Breast cancer, stage 2 ([place]) 04/05/2011   Mestatic to liver,bones, possible gallbladder  . Family history of lung cancer   . HTN (hypertension), benign 04/05/2011  . Hyperglycemia without ketosis 04/05/2011  . IBD (inflammatory bowel disease) 04/05/2011    Past Surgical History:  Procedure Laterality Date  . ABDOMINAL HYSTERECTOMY    . AUGMENTATION MAMMAPLASTY Bilateral   . BILIARY STENT PLACEMENT  11/17/2017   Procedure: BILIARY STENT PLACEMENT;  Surgeon: Arta Silence, MD;  Location: [facility] ENDOSCOPY;  Service: Endoscopy;;  . COLONOSCOPY    . ERCP N/A 11/17/2017   Procedure: ENDOSCOPIC RETROGRADE CHOLANGIOPANCREATOGRAPHY (ERCP);  Surgeon: Arta Silence, MD;  Location: [facility] ENDOSCOPY;  Service: Endoscopy;  Laterality: N/A;  . ESOPHAGOGASTRODUODENOSCOPY (EGD) WITH PROPOFOL N/A 11/26/2017   Procedure: ESOPHAGOGASTRODUODENOSCOPY (EGD) WITH PROPOFOL;  Surgeon: Arta Silence, MD;  Location: [place];  Service: Endoscopy;  Laterality: N/A;  . EUS N/A 11/26/2017   Procedure: FULL UPPER ENDOSCOPIC ULTRASOUND (EUS) RADIAL;  Surgeon: Arta Silence, MD;  Location: [place];  Service: Endoscopy;  Laterality: N/A;  . IR IMAGING GUIDED PORT INSERTION  12/07/2017  . KNEE SURGERY Right    ACL   . MASTECTOMY Right   . SPHINCTEROTOMY  11/17/2017   Procedure: SPHINCTEROTOMY;  Surgeon: Arta Silence, MD;  Location: [facility] ENDOSCOPY;  Service: Endoscopy;;    Prior to Admission medications   Medication Sig Start Date End Date Taking? Authorizing Provider  fluconazole (DIFLUCAN) 100 MG tablet Take 100 mg by mouth daily at 12 noon. 01/28/18 03/10/18 Yes [provider]  FLUoxetine (PROZAC) 10 MG capsule Take 10 mg by mouth  daily as needed (depression).  07/14/17 07/15/18 Yes [provider]  ibuprofen (ADVIL,MOTRIN) 400 MG tablet Take 1 tablet (400 mg total) by mouth every 6 (six) hours as needed (back pain). 11/19/17  Yes Arrien, Jimmy Picket, MD  lidocaine-prilocaine (EMLA) cream Apply  to affected area once Patient taking differently: Apply 1 application topically as needed. Apply to affected area once 11/23/17  Yes Truitt Merle, MD  LORazepam (ATIVAN) 0.5 MG tablet Take 1 tablet (0.5 mg total) by mouth 2 (two) times daily as needed for anxiety or sleep. 12/10/17  Yes Truitt Merle, MD  magic mouthwash w/lidocaine SOLN Take 5 mLs by mouth 2 (two) times daily. Take 15 ml by mouth qid before meals and bedtime. For chemotherapy induced mucositis 01/10/18 03/12/18 Yes [provider]  morphine (MS CONTIN) 15 MG 12 hr tablet Take 1 tablet (15 mg total) by mouth every 12 (twelve) hours. 11/23/17  Yes Truitt Merle, MD  NON FORMULARY Take 13.5 mg by mouth daily at 12 noon. [facility] [phone] pemigatinib ([phone]) 13.5 mg tablet  Add as: unknown  Dose: 13.5 mg Take 1 tablet (13.5 mg total) by mouth daily for 21 days. 01/27/2018 02/18/2018 [facility] 01/28/2018   Yes [provider]  oxyCODONE (OXY IR/ROXICODONE) 5 MG immediate release tablet Take 1 tablet (5 mg total) by mouth every 4 (four) hours as needed for moderate pain. 11/23/17  Yes Truitt Merle, MD  pantoprazole (PROTONIX) 40 MG tablet Take 1 tablet (40 mg total) by mouth daily. Patient taking differently: Take 40 mg by mouth daily as needed (acid reflux).  11/20/17 01/31/18 Yes Arrien, Jimmy Picket, MD  polyethylene glycol [facility] Floria Raveling) packet Take 17 g by mouth daily. Patient taking differently: Take 17 g by mouth daily as needed for mild constipation.  11/20/17  Yes Arrien, Jimmy Picket, MD  promethazine (PHENERGAN) 25 MG tablet Take 1 tablet (25 mg total) by mouth every 6 (six) hours as needed for nausea or vomiting. 11/19/17  Yes Arrien, Jimmy Picket, MD  senna (SENOKOT) 8.6 MG TABS tablet Take 1 tablet by mouth daily as needed for mild constipation.   Yes [provider]  sucralfate (CARAFATE) 1 g tablet Crush 1 tab and mix in 1 oz water 12 qAC and HS Patient taking differently: Take 1 g by mouth  as needed. Crush 1 tab and mix in 1 oz water 12 qAC and HS 12/02/17  Yes Hayden Pedro, PA-C  ondansetron (ZOFRAN) 8 MG tablet Take 1 tablet (8 mg total) by mouth 2 (two) times daily as needed. Start on the third day after chemotherapy. 11/23/17   Truitt Merle, MD    No current facility-administered medications for this encounter.    Current Outpatient Medications  Medication Sig Dispense Refill  . fluconazole (DIFLUCAN) 100 MG tablet Take 100 mg by mouth daily at 12 noon.    Marland Kitchen FLUoxetine (PROZAC) 10 MG capsule Take 10 mg by mouth daily as needed (depression).     Marland Kitchen ibuprofen (ADVIL,MOTRIN) 400 MG tablet Take 1 tablet (400 mg total) by mouth every 6 (six) hours as needed (back pain). 30 tablet 0  . lidocaine-prilocaine (EMLA) cream Apply to affected area once (Patient taking differently: Apply 1 application topically as needed. Apply to affected area once) 30 g 3  . LORazepam (ATIVAN) 0.5 MG tablet Take 1 tablet (0.5 mg total) by mouth 2 (two) times daily as needed for anxiety or sleep. 30 tablet 0  . magic mouthwash w/lidocaine SOLN Take 5  mLs by mouth 2 (two) times daily. Take 15 ml by mouth qid before meals and bedtime. For chemotherapy induced mucositis    . morphine (MS CONTIN) 15 MG 12 hr tablet Take 1 tablet (15 mg total) by mouth every 12 (twelve) hours. 60 tablet 0  . NON FORMULARY Take 13.5 mg by mouth daily at 12 noon. [facility] [id number] pemigatinib [phone]) 13.5 mg tablet  Add as: unknown  Dose: 13.5 mg Take 1 tablet (13.5 mg total) by mouth daily for 21 days. 01/27/2018 02/18/2018 [facility] 01/28/2018    . oxyCODONE (OXY IR/ROXICODONE) 5 MG immediate release tablet Take 1 tablet (5 mg total) by mouth every 4 (four) hours as needed for moderate pain. 60 tablet 0  . pantoprazole (PROTONIX) 40 MG tablet Take 1 tablet (40 mg total) by mouth daily. (Patient taking differently: Take 40 mg by mouth daily as needed (acid reflux). ) 30 tablet 0  . polyethylene glycol (MIRALAX /  GLYCOLAX) packet Take 17 g by mouth daily. (Patient taking differently: Take 17 g by mouth daily as needed for mild constipation. ) 14 each 0  . promethazine (PHENERGAN) 25 MG tablet Take 1 tablet (25 mg total) by mouth every 6 (six) hours as needed for nausea or vomiting. 20 tablet 0  . senna (SENOKOT) 8.6 MG TABS tablet Take 1 tablet by mouth daily as needed for mild constipation.    . sucralfate (CARAFATE) 1 g tablet Crush 1 tab and mix in 1 oz water 12 qAC and HS (Patient taking differently: Take 1 g by mouth as needed. Crush 1 tab and mix in 1 oz water 12 qAC and HS) 120 tablet 1  . ondansetron (ZOFRAN) 8 MG tablet Take 1 tablet (8 mg total) by mouth 2 (two) times daily as needed. Start on the third day after chemotherapy. 30 tablet 1    Allergies as of 01/31/2018  . (No Known Allergies)    Family History  Problem Relation Age of Onset  . Lung cancer Father        dx 35s, d 50, asbestos exposure  . Heart attack Paternal Uncle     Social History   Socioeconomic History  . Marital status: Married    Spouse name: Not on file  . Number of children: Not on file  . Years of education: Not on file  . Highest education level: Not on file  Occupational History  . Not on file  Social Needs  . Financial resource strain: Not on file  . Food insecurity:    Worry: Not on file    Inability: Not on file  . Transportation needs:    Medical: Not on file    Non-medical: Not on file  Tobacco Use  . Smoking status: Former Smoker    Years: 20.00    Last attempt to quit: 2019    Years since quitting: 1.0  . Smokeless tobacco: Never Used  Substance and Sexual Activity  . Alcohol use: Not Currently  . Drug use: Never  . Sexual activity: Not on file  Lifestyle  . Physical activity:    Days per week: Not on file    Minutes per session: Not on file  . Stress: Not on file  Relationships  . Social connections:    Talks on phone: Not on file    Gets together: Not on file    Attends  religious service: Not on file    Active member of club or organization: Not on file  Attends meetings of clubs or organizations: Not on file    Relationship status: Not on file  . Intimate partner violence:    Fear of current or ex partner: Not on file    Emotionally abused: Not on file    Physically abused: Not on file    Forced sexual activity: Not on file  Other Topics Concern  . Not on file  Social History Narrative  . Not on file    Review of Systems: See HPI  Physical Exam: Vital signs in last 24 hours: Temp:  [98 F (36.7 C)] 98 F (36.7 C) (01/06 1055) Pulse Rate:  [74-94] 74 (01/06 1630) Resp:  [14-20] 20 (01/06 1630) BP: (96-118)/(71-91) 118/91 (01/06 1630) SpO2:  [94 %-99 %] 99 % (01/06 1630)   This is a cognitively intact, articulate chronically ill-appearing Caucasian female, not in acute distress at the time of her exam, but definitely uncomfortable.  There is no frank jaundice.  The chest is clear anteriorly, heart normal, abdomen moderately protuberant to perhaps 6 or 7 months of pregnancy equivalent, not tense, rigid, or particularly tender.  No mass-effect.  1-2+ tibial pitting edema.  No evident focal neurologic deficits.  Intake/Output from previous day: No intake/output data recorded. Intake/Output this shift: Total I/O In: 1000 [IV Piggyback:1000] Out: -   Lab Results: Recent Labs    01/31/18 1136  WBC 15.3*  HGB 11.7*  HCT 35.2*  PLT 227   BMET Recent Labs    01/31/18 1136  NA 127*  K 4.6  CL 98  CO2 20*  GLUCOSE 114*  BUN 12  CREATININE 0.76  CALCIUM 8.1*   LFT Recent Labs    01/31/18 1136  PROT 4.9*  ALBUMIN 2.0*  AST 99*  ALT 40  ALKPHOS 1,139*  BILITOT 3.4*   PT/INR Recent Labs    01/31/18 1136  LABPROT 15.0  INR 1.19    Studies/Results: Ct Abdomen Pelvis W Contrast  Result Date: 01/31/2018 CLINICAL DATA:  Increasing abdominal bloating with right-sided abdominal pain for 3 weeks. On chemotherapy for  metastatic breast cancer. EXAM: CT ABDOMEN AND PELVIS WITH CONTRAST TECHNIQUE: Multidetector CT imaging of the abdomen and pelvis was performed using the standard protocol following bolus administration of intravenous contrast. CONTRAST:  146m OMNIPAQUE IOHEXOL 300 MG/ML  SOLN COMPARISON:  Abdominopelvic CT 11/15/2017. Abdominal MRI 11/16/2017. FINDINGS: Lower chest: Enlarging nodules at both lung bases consistent with metastatic disease. These measure up to 5 mm in the right lower lobe on image 5/4. There is a small dependent left pleural effusion with mild left lower lobe atelectasis. Coronary artery atherosclerosis and a small hiatal hernia are noted. Hepatobiliary: Previously demonstrated widespread hepatic abnormalities are smaller and lower in density, likely treated metastatic disease. The largest focal components measures 7.7 x 4.5 cm posteriorly in the right hepatic lobe on image 18/3 (previously 8.3 x 5.7 cm) and 8.2 x 6.0 cm anteriorly in the right lobe on image 25/3 (previously 10.0 x 6.8 cm). Residual viable tumor is certainly possible. There is mild irregular gallbladder wall thickening without calcified cholelithiasis. Metallic biliary stents are in place with gas in the stents. No intrahepatic pneumobilia or significant biliary dilatation identified. Pancreas: Unremarkable. No pancreatic ductal dilatation or surrounding inflammatory changes. Spleen: Normal in size without focal abnormality. Adrenals/Urinary Tract: Both adrenal glands appear normal. The kidneys appear normal without evidence of urinary tract calculus, suspicious lesion or hydronephrosis. No bladder abnormalities are seen. Stomach/Bowel: No evidence of focal bowel wall thickening, distention or surrounding  inflammatory change. Vascular/Lymphatic: Previously demonstrated adenopathy in the porta hepatis has resolved. There are small residual retroperitoneal lymph nodes. There is mild aortic and branch vessel atherosclerosis. The portal,  superior mesenteric and splenic veins are patent. Reproductive: The cervix appears stable post hysterectomy. No adnexal mass. Other: There is a large amount of ascites. There is minimal nodularity in the right pericolic gutter (image 53/6). Musculoskeletal: Multifocal blastic osseous metastatic disease again noted in the thoracolumbar spine, similar to previous study and further detailed on separate examination of the lumbar spine. Bilateral breast implants without chest wall lesion. IMPRESSION: 1. New large amount of ascites throughout the peritoneal cavity, accounting for the abdominal distension. Minimal peritoneal nodularity in the right pericolic gutter, potentially malignant ascites. 2. The hepatic and porta hepatis nodal metastatic disease has improved. Osseous metastatic disease is grossly stable. 3. Increased nodularity at the lung bases, suspicious for progressive metastatic disease. 4. Status post interval biliary stenting. There is air within the lumen of the stent, but not within the intrahepatic bile ducts. Electronically Signed   By: Richardean Sale M.D.   On: 01/31/2018 14:43   Ct L-spine No Charge  Result Date: 01/31/2018 CLINICAL DATA:  Increasing abdominal bloating with right-sided abdominal pain for 3 weeks. On chemotherapy for metastatic breast cancer. EXAM: CT LUMBAR SPINE WITHOUT CONTRAST TECHNIQUE: Multiplanar CT image reconstructions of the lumbar spine were generated from the abdominal CT data obtained concurrently. COMPARISON: Abdominal radiographs 01/17/2018.  Abdominopelvic CT 11/15/2017. FINDINGS: Segmentation: Normal. Alignment: Normal. Vertebrae: Multifocal osseous metastatic disease is again noted with relatively limited involvement in the lumbar spine. There is a sclerotic component within the left aspect of the L1 vertebral body and the left pedicle which has not significantly changed. Prominent osseous metastases are present in the T8, T9 and T10 vertebral bodies and posterior  elements, with lesser involvement at T11 and T12. Small sclerotic metastases are seen in the bony pelvis. No pathologic fracture or epidural tumor identified. Paraspinal and other soft tissues: Reported separately on abdominopelvic CT. Disc levels: No large disc herniation, spinal stenosis, nerve root encroachment or epidural tumor. Chronic degenerative disc disease at L5-S1 with mild foraminal narrowing bilaterally. Asymmetric facet hypertrophy on the right at L4-5. IMPRESSION: 1. Similar multifocal osseous metastatic disease involving the thoracolumbar spine and bony pelvis compared with prior CT from 10 weeks ago. No pathologic fracture or epidural tumor identified. 2. Mild lumbar spondylosis without large disc herniation or significant spinal stenosis. 3. Abdominal findings dictated separately. Electronically Signed   By: Richardean Sale M.D.   On: 01/31/2018 14:50    Impression: 1.  Metastatic cholangiocarcinoma with new onset ascites 2.  Intensified abdominal pain, predominantly in the right upper quadrant and right mid abdominal area, made worse by movement.  Not clear if this is due to pressure from the ascitic fluid, peritoneal involvement with tumor, or infection. 3.  History of metastatic biliary obstruction, satisfactorily controlled by stent placement.  Plan: Agree with plan for paracentesis today.  It is hoped that the relief of pressure from the fluid will itself bring about improvement in the patient's pain, but if it fails to do so, that would imply either tumor infiltration or infection as likely causes for the patient's pain.  There has not yet been a definite decision about whether the patient will be released from the emergency room or whether she will be admitted.  In my opinion, if she does have satisfactory pain relief from the paracentesis (which will be performed by the ER  physician, since interventional radiology does not have time on their schedule this afternoon), it would be  reasonable for the patient to go home.  If not, she should probably come in for symptom control and perhaps more definitive drainage of the peritoneal fluid by interventional radiology tomorrow.   LOS: 0 days   [place]  01/31/2018, 4:45 PM   Pager [phone] If no answer or after 5 PM call [phone]

## 2018-01-31 NOTE — ED Triage Notes (Signed)
Pt in c/o possible low sodium and increased ascites, pt is currently receiving chemo, c/o increased abdominal pain

## 2018-01-31 NOTE — ED Notes (Signed)
Patient transported to CT 

## 2018-01-31 NOTE — Telephone Encounter (Signed)
I have reviewed her chart, including her CT scan done at ED today. I called pt's husband Caitlin Pollard and discussed her condition. She just had paracentesis done in the ED today with 3 L of ascites removed.  She now feels much better.  She is awaiting for the Doppler to rule out portal vein thrombosis, and then likely will be discharged home from ED today.  I will set up her follow-up with me, with lab tests this Thursday or Friday. I plan to call her oncologist Dr. Benjie Karvonen at [facility] clinic tomorrow and update her current condition.   Mark agreed with my plan and appreciated the call.  Caitlin Pollard  01/31/2018 5:54 PM

## 2018-02-01 ENCOUNTER — Telehealth (HOSPITAL_BASED_OUTPATIENT_CLINIC_OR_DEPARTMENT_OTHER): Payer: Self-pay | Admitting: Emergency Medicine

## 2018-02-01 ENCOUNTER — Inpatient Hospital Stay (HOSPITAL_COMMUNITY): Admission: RE | Admit: 2018-02-01 | Payer: BLUE CROSS/BLUE SHIELD | Source: Ambulatory Visit

## 2018-02-01 ENCOUNTER — Encounter (HOSPITAL_COMMUNITY): Payer: Self-pay

## 2018-02-01 ENCOUNTER — Ambulatory Visit (HOSPITAL_COMMUNITY): Payer: BLUE CROSS/BLUE SHIELD

## 2018-02-01 LAB — TRIGLYCERIDES, BODY FLUIDS: Triglycerides, Fluid: 39 mg/dL

## 2018-02-02 ENCOUNTER — Telehealth: Payer: Self-pay | Admitting: Hematology

## 2018-02-02 LAB — PH, BODY FLUID: pH, Body Fluid: 7.8

## 2018-02-02 NOTE — Progress Notes (Signed)
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       Malignant neoplasm of overlapping sites of right breast in female, estrogen receptor positive ([place])   1999 Surgery    status post right mastectomy 1999 with immediate reconstruction, total 9 right axillary lymph nodes removed     04/1998 - 12/2008 Anti-estrogen oral therapy    status post tamoxifen April 2000 through December 2009, then letrozole for 1 year    2000 -  Chemotherapy    status post cyclophosphamide and doxorubicin x4 adjuvantly    11/11/2017 Initial Diagnosis    Malignant neoplasm of overlapping sites of right breast in female, estrogen receptor positive ([place]) - pT1b pN1, 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 Estrogen Receptor: 0%, NEGATIVE Progesterone Receptor: 0%, NEGATIVE Proliferation Marker Ki67: 10%    11/15/2017 Pathology Results    Diagnosis Liver, needle/core biopsy - ADENOCARCINOMA, SEE COMMENT    11/15/2017 Imaging    11/15/2017 CT CAP IMPRESSION: 1. Hepatic metastatic disease with innumerable liver lesions, biopsied earlier today. No evidence of post biopsy hemorrhage or complication. 2. Intrahepatic and proximal extrahepatic biliary ductal dilatation, abrupt  transition proximally in the common duct with associated soft tissue density, which may represent adenopathy but is not well delineated. Ill-defined soft tissue density in the porta hepatis and portacaval region is likely adenopathy. This is contiguous with the uncinate process of the pancreas, focal pancreatic lesion difficult to exclude. No pancreatic ductal dilatation. 3. Attenuated portal vein at the portal splenic confluence, possibly occluded with suspected portal collaterals. Portal vein is otherwise patent. 4. Osseous metastatic disease with primarily sclerotic lesions, primarily involving the spine. Lesions involve T8, T9, T10, T11, and L1. Indeterminate lesion at T4 versus hemangioma. T10 lesion involves the entire vertebral body, with heterogeneous appearance of the lamina and possible extraosseous component and mass-effect on the spinal canal. Recommend thoracic spine MRI to evaluate for cord compression. 5. Nonspecific small pulmonary nodules, largest measuring 4 mm in the left upper lobe. 6. Small amount of perihepatic and pelvic ascites. 7. Aortic Atherosclerosis (ICD10-I70.0). Coronary artery calcifications.     11/15/2017 Imaging     11/15/2017 US Liver IMPRESSION: Ultrasound-guided core biopsy performed of 2 adjacent solid hypoechoic lesions within the left lobe of the liver. Innumerable lesions are seen throughout the liver parenchyma as well as evidence of biliary obstruction with dilated intrahepatic ducts, particularly in the left lobe. Recommend eventual correlation with CT of the chest, abdomen and pelvis for further staging.    11/16/2017 Imaging    11/16/2017 MRI Thoracic Spine IMPRESSION: 1. Multiple osseous metastasis (T5, T7, T8, T9, T10, T12 and L1), known T10 pathologic fracture better seen on yesterday's CT. 2. Epidural tumoral extension from T8-9 to T10-11 resulting in moderate to severe canal stenosis at T10, no spinal cord edema.  11/16/2017 Imaging    11/16/2017 MRI Abdomen IMPRESSION: Diffuse liver metastases.  Diffuse biliary ductal dilatation due to 2.1 cm soft tissue mass in the porta hepatis and involving the superior margin of the pancreatic head. Differential diagnosis includes primary pancreatic carcinoma, cholangiocarcinoma, and metastatic lymphadenopathy. Consider ERCP for further evaluation.     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   She experienced moderate fatigue and a slight cough but otherwise denied any significant difficulties.    11/26/2017 Procedure    Upper Endoscopy [place] by Dr. Paulita Fujita 11/26/17 IMPRESSION - Endosonographic images of the stomach were unremarkable. - Significant shadowing artifact of pancreas from bile duct stents, precluding complete visualization. However, with this limitation in mind, there was no sign of significant pathology in the entire pancreas. - Irregular liver masses seen in left lobe of liver, previously biopsied. - Two stents were visualized endosonographically in the common bile duct and in the left intrahepatic bile duct(s). - Periportal necrotic-appearing adenopathy without any clear communication or relationship to pancreas. - Perihepatic ascites (free fluid). - Overall, based on my findings, I would suspect gallbladder/bile duct system (and not pancreas) as source of patient's adenocarcinoma, mindful of some limitations in views of pancreas from bile duct wallstents.    12/16/2017 Miscellaneous    Pt participated a clinical trial at [facility] clinic in [facility]: Research Study: [facility]  FLA;[id number];INCB [id number];FIGHT-302;A Phase 3, Open-Label, Randomized, Active-Controlled, Multicenter Study to Evaluate the Efficacy and Safety of Pemigatinib Versus Gemcitabine Plus Cisplat... Treatment Protocol: Novinger [id number] Group A [phone] (Pemigatinib) )       CURRENT THERAPY: Research Study:  A Phase 3, Open-Label, Randomized, Active-Controlled, Multicenter Study to Evaluate the Efficacy and Safety of Pemigatinib Versus Gemcitabine Plus Cisplatin, pt was randomized to Pemigatinib   INTERVAL HISTORY:  Caitlin Pollard is here for a follow up after her recent paracentesis.  I last saw her on November 23, 2017.  She has participated a phase 3 clinical trial in the [facility] [place].  She was randomized to a FGFR inhibitor Pemigatinib started on December 16, 2017.  She has been tolerating treatment well overall, and her restaging scan showed good response.  However she has developed worsening ascites about 2-3 weeks ago, and presented to [facility] ED on 01/31/2018, had paracentesis with 3.3 L fluids removed in the ED. she felt significantly better, and was discharged home.  She still has mild abdominal pain, mainly in the right upper quadrant, and left lower quadrant, below see paracentesis site.  She denies any fever or chills in the past few weeks.  Her back pain has much improved since palliative radiation.  She has moderate fatigue, but able to do ADLs.  She also went out for shopping with her friend for 3 to 4 hours yesterday.  She was seen by GI Dr. Driscilla Grammes this morning, and plan to have ERCP next week due to her increasing bilirubin level.   REVIEW OF SYSTEMS:   Constitutional: Denies fevers, chills or abnormal weight loss, (+) fatigue  Eyes: Denies blurriness of vision Ears, nose, mouth, throat, and face: Denies mucositis or sore throat Respiratory: Denies cough,  dyspnea or wheezes Cardiovascular: Denies palpitation, chest discomfort or lower extremity  swelling Gastrointestinal:  Denies nausea, heartburn or change in bowel habits, (+) abdominal pain and bloating  Skin: Denies abnormal skin rashes Lymphatics: Denies new lymphadenopathy or easy bruising Neurological:Denies numbness, tingling or new weaknesses Behavioral/Psych: Mood is stable, no new changes  All other systems were reviewed with the patient and are negative.  MEDICAL HISTORY:  Past Medical History:  Diagnosis Date  . Breast cancer ([place])   . Breast cancer, stage 2 ([place]) 04/05/2011   Mestatic to liver,bones, possible gallbladder  . Family history of lung cancer   . HTN (hypertension), benign 04/05/2011  . Hyperglycemia without ketosis 04/05/2011  . IBD (inflammatory bowel disease) 04/05/2011    SURGICAL HISTORY: Past Surgical History:  Procedure Laterality Date  . ABDOMINAL HYSTERECTOMY    . AUGMENTATION MAMMAPLASTY Bilateral   . BILIARY STENT PLACEMENT  11/17/2017   Procedure: BILIARY STENT PLACEMENT;  Surgeon: Arta Silence, MD;  Location: [facility] ENDOSCOPY;  Service: Endoscopy;;  . COLONOSCOPY    . ERCP N/A 11/17/2017   Procedure: ENDOSCOPIC RETROGRADE CHOLANGIOPANCREATOGRAPHY (ERCP);  Surgeon: Arta Silence, MD;  Location: [facility] ENDOSCOPY;  Service: Endoscopy;  Laterality: N/A;  . ESOPHAGOGASTRODUODENOSCOPY (EGD) WITH PROPOFOL N/A 11/26/2017   Procedure: ESOPHAGOGASTRODUODENOSCOPY (EGD) WITH PROPOFOL;  Surgeon: Arta Silence, MD;  Location: [place];  Service: Endoscopy;  Laterality: N/A;  . EUS N/A 11/26/2017   Procedure: FULL UPPER ENDOSCOPIC ULTRASOUND (EUS) RADIAL;  Surgeon: Arta Silence, MD;  Location: [place];  Service: Endoscopy;  Laterality: N/A;  . IR IMAGING GUIDED PORT INSERTION  12/07/2017  . KNEE SURGERY Right    ACL   . MASTECTOMY Right   . SPHINCTEROTOMY  11/17/2017   Procedure: SPHINCTEROTOMY;  Surgeon: Arta Silence, MD;  Location: [facility] ENDOSCOPY;  Service: Endoscopy;;    I have reviewed the social history and family history with the  patient and they are unchanged from previous note.  ALLERGIES:  has No Known Allergies.  MEDICATIONS:  Current Outpatient Medications  Medication Sig Dispense Refill  . FLUoxetine (PROZAC) 10 MG capsule Take 10 mg by mouth daily as needed (depression).     Marland Kitchen ibuprofen (ADVIL,MOTRIN) 400 MG tablet Take 1 tablet (400 mg total) by mouth every 6 (six) hours as needed (back pain). 30 tablet 0  . lidocaine-prilocaine (EMLA) cream Apply to affected area once 30 g 3  . LORazepam (ATIVAN) 0.5 MG tablet Take 1 tablet (0.5 mg total) by mouth 2 (two) times daily as needed for anxiety or sleep. (Patient taking differently: Take 0.5 mg by mouth at bedtime. ) 30 tablet 0  . magic mouthwash w/lidocaine SOLN Take 5 mLs by mouth 2 (two) times daily as needed for mouth pain. Take 15 ml by mouth qid before meals and bedtime. For chemotherapy induced mucositis    . morphine (MS CONTIN) 15 MG 12 hr tablet Take 1 tablet (15 mg total) by mouth every 12 (twelve) hours. 60 tablet 0  . NON FORMULARY Take 13.5 mg by mouth daily at 12 noon. Research IRB [phone] pemigatinib [phone]) 13.5 mg tablet  Add as: unknown  Dose: 13.5 mg Take 1 tablet (13.5 mg total) by mouth daily for 21 days. 01/27/2018 02/18/2018 [facility] 01/28/2018    . oxyCODONE (OXY IR/ROXICODONE) 5 MG immediate release tablet Take 1 tablet (5 mg total) by mouth every 4 (four) hours as needed for moderate pain. 60 tablet 0  . pantoprazole (PROTONIX) 40 MG tablet Take 1 tablet (40 mg total) by  mouth daily. 30 tablet 0  . polyethylene glycol (MIRALAX / GLYCOLAX) packet Take 17 g by mouth daily. 14 each 0  . senna (SENOKOT) 8.6 MG TABS tablet Take 1 tablet by mouth daily as needed for mild constipation.    . ondansetron (ZOFRAN) 8 MG tablet Take 1 tablet (8 mg total) by mouth 2 (two) times daily as needed. Start on the third day after chemotherapy. (Patient not taking: Reported on 02/04/2018) 30 tablet 1  . promethazine (PHENERGAN) 25 MG tablet Take 1 tablet  (25 mg total) by mouth every 6 (six) hours as needed for nausea or vomiting. (Patient not taking: Reported on 02/04/2018) 20 tablet 0  . sucralfate (CARAFATE) 1 g tablet Crush 1 tab and mix in 1 oz water 12 qAC and HS (Patient not taking: Reported on 02/04/2018) 120 tablet 1   No current facility-administered medications for this visit.     PHYSICAL EXAMINATION: ECOG PERFORMANCE STATUS: 2 - Symptomatic, <50% confined to bed  Vitals:   02/04/18 1242  BP: 100/70  Pulse: 82  Resp: 18  Temp: 97.7 F (36.5 C)  SpO2: 99%   Filed Weights   02/04/18 1242  Weight: 142 lb 8 oz (64.6 kg)   GENERAL:alert, no distress and comfortable SKIN: skin color, texture, turgor are normal, no rashes or significant lesions EYES: normal, Conjunctiva are pink and non-injected, sclera icterus  OROPHARYNX:no exudate, no erythema and lips, buccal mucosa, and tongue normal  NECK: supple, thyroid normal size, non-tender, without nodularity LYMPH:  no palpable lymphadenopathy in the cervical, axillary or inguinal LUNGS: clear to auscultation and percussion with normal breathing effort HEART: regular rate & rhythm and no murmurs and no lower extremity edema ABDOMEN:abdomen ended, soft, diffuse mild tenderness, especially in the left lower quadrant, and right upper quadrant, no rebound pain.  Significant hepatomegaly, 5cm below rib cage. (+) Moderate ascites. Musculoskeletal:no cyanosis of digits and no clubbing  NEURO: alert & oriented x 3 with fluent speech, no focal motor/sensory deficits EXT: (+) Lateral pitting edema up to knee, left more than right, no calf tenderness.  LABORATORY DATA:  I have reviewed the data as listed CBC Latest Ref Rng & Units 01/31/2018 12/07/2017 12/01/2017  WBC 4.0 - 10.5 K/uL 15.3(H) 11.7(H) 11.5(H)  Hemoglobin 12.0 - 15.0 g/dL 11.7(L) 9.0(L) 9.0(L)  Hematocrit 36.0 - 46.0 % 35.2(L) 29.5(L) 28.0(L)  Platelets 150 - 400 K/uL 227 196 305     CMP Latest Ref Rng & Units 01/31/2018  12/01/2017 11/23/2017  Glucose 70 - 99 mg/dL 114(H) 108(H) 111(H)  BUN 6 - 20 mg/dL 12 13 12   Creatinine 0.44 - 1.00 mg/dL 0.76 0.69 0.60  Sodium 135 - 145 mmol/L 127(L) 134(L) 132(L)  Potassium 3.5 - 5.1 mmol/L 4.6 4.3 3.8  Chloride 98 - 111 mmol/L 98 98 95(L)  CO2 22 - 32 mmol/L 20(L) 27 27  Calcium 8.9 - 10.3 mg/dL 8.1(L) 9.1 8.9  Total Protein 6.5 - 8.1 g/dL 4.9(L) 6.2(L) 5.9(L)  Total Bilirubin 0.3 - 1.2 mg/dL 3.4(H) 3.0(H) 4.3(HH)  Alkaline Phos 38 - 126 U/L 1,139(H) 1,138(H) 1,330(H)  AST 15 - 41 U/L 99(H) 82(H) 95(H)  ALT 0 - 44 U/L 40 44 66(H)      RADIOGRAPHIC STUDIES: I have personally reviewed the radiological images as listed and agreed with the findings in the report. Vas [place] Lower Extremity Venous (dvt)  Result Date: 02/04/2018  Lower Venous Study Indications: Swelling.  Performing Technologist: Toma Copier, D  Examination Guidelines: A complete evaluation includes  B-mode imaging, spectral Doppler, color Doppler, and power Doppler as needed of all accessible portions of each vessel. Bilateral testing is considered an integral part of a complete examination. Limited examinations for reoccurring indications may be performed as noted.  Right Venous Findings: +---------+---------------+---------+-----------+----------+-------+          CompressibilityPhasicitySpontaneityPropertiesSummary +---------+---------------+---------+-----------+----------+-------+ CFV      Full           Yes      Yes                          +---------+---------------+---------+-----------+----------+-------+ SFJ      Full                                                 +---------+---------------+---------+-----------+----------+-------+ FV Prox  Full           Yes      Yes                          +---------+---------------+---------+-----------+----------+-------+ FV Mid   Full           Yes      Yes                           +---------+---------------+---------+-----------+----------+-------+ FV DistalFull           Yes      Yes                          +---------+---------------+---------+-----------+----------+-------+ PFV      Full           Yes      Yes                          +---------+---------------+---------+-----------+----------+-------+ POP      Full           Yes      Yes                          +---------+---------------+---------+-----------+----------+-------+ PTV      Full                                                 +---------+---------------+---------+-----------+----------+-------+ PERO     Full                                                 +---------+---------------+---------+-----------+----------+-------+  Left Venous Findings: +---------+---------------+---------+-----------+----------+-------+          CompressibilityPhasicitySpontaneityPropertiesSummary +---------+---------------+---------+-----------+----------+-------+ CFV      Full           Yes      Yes                          +---------+---------------+---------+-----------+----------+-------+ SFJ      Full                                                 +---------+---------------+---------+-----------+----------+-------+  FV Prox  Full           Yes      Yes                          +---------+---------------+---------+-----------+----------+-------+ FV Mid   Full           Yes      Yes                          +---------+---------------+---------+-----------+----------+-------+ FV DistalFull           Yes      Yes                          +---------+---------------+---------+-----------+----------+-------+ PFV      Full           Yes      Yes                          +---------+---------------+---------+-----------+----------+-------+ POP      Full           Yes      Yes                           +---------+---------------+---------+-----------+----------+-------+ PTV      Full                                                 +---------+---------------+---------+-----------+----------+-------+ PERO     Full                                                 +---------+---------------+---------+-----------+----------+-------+    Summary: Right: There is no evidence of deep vein thrombosis in the lower extremity. No cystic structure found in the popliteal fossa. Left: There is no evidence of deep vein thrombosis in the lower extremity. No cystic structure found in the popliteal fossa.  *See table(s) above for measurements and observations. Electronically signed by Monica Martinez MD on 02/04/2018 at 5:48:39 PM.    Final      ASSESSMENT & PLAN:  Caitlin Pollard is a 58 y.o. female with   1. 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 S/P ERCP and palliative radiation to spine  -she has enrolled to a phase 3 clinical trial at [facility] clinic in [facility], and was randomized to a F GFR inhibitor Pemigatinib.  She is overall tolerating well, restaging CT scan showed good partial response. -Due to her hyponatremia, Pemigatinib was held for several days, and restarted yesterday per her treating physician at [facility]  -I reviewed her recent CT scan which was done in the ED on January 31, 2018, which showed improvement in liver metastasis, new large amount of ascites, with minimal peritoneal nodularity.  Increased nodularity at the lung bases, indeterminate. -She is scheduled to follow-up with Dr. Benjie Karvonen at [facility] on 1/21 with restaging scan and labs  -I will continue support her locally as needed   2. New onset large volume ascites with (+)  culture  -Unknown etiology.  She underwent paracentesis on January 31, 2018, cytology was negative for malignant cells, but it showed atypical cells. -CT scan on  January 07/16/2018, showed minimal nodularity in the peritoneum, I think peritoneal metastasis is a possibility. -Her ascites culture showed positive strep mitis/oralis, however the cell count from ascites showed WBC 42.  Neutrophil 8%, lymphocytes 46% and Mono46%, normal glucose, which does not support bacterial infection.  Clinically, she has no fever, significant abdominal tenderness or rebound pain, no high suspicious for acute peritonitis.  I discussed with ID Dr. Baxter Flattery, both of [place] feel the positive culture could be skin contamination, will observe her clinically without antibiotics.  She knows to go to emergency room if she spikes fever, or develops worsening abdominal pain.  3.  Obstructive jaundice -Due to cholangiocarcinoma.  She initially underwent ERCP with stent placement by Dr. Laurence Ferrari. -Her total bilirubin has been trending up lately, up to 4.0, Dr. Laurence Ferrari plans to repeat ERCP next week.   3. LE edema, L>R -Given her underlying metastatic cancer, she is at high risk for thrombosis -Bilateral lower extremity Doppler was done today, which was negative for DVT. I spoke with pt after the test   4. HTN -f/u with PCP  5. Depression and Anxiety -Will continue fluoxetine and as needed lorazepam.  4. 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 -Pain is mild and tolerable    PLAN -she is scheduled to have ERCP next week -she will return to [facility] for clinical trial f/u on 1/21 -I will continue to see her as needed as her local oncologist  -I will call Dr. Benjie Karvonen at [facility] to update him -pt knows to call us if needed     No problem-specific Assessment & Plan notes found for this encounter.   No orders of the defined types were placed in this encounter.  All questions were answered. The patient knows to call the clinic  with any problems, questions or concerns. No barriers to learning was detected. I spent 30 minutes counseling the patient face to face. The total time spent in the appointment was 40 minutes and more than 50% was on counseling and review of test results     Truitt Merle, MD 02/04/2018   I, Joslyn Devon, am acting as scribe for Truitt Merle, MD.   I have reviewed the above documentation for accuracy and completeness, and I agree with the above.

## 2018-02-02 NOTE — Telephone Encounter (Signed)
Scheduled appt per 1/7 sch message - pt is aware of appt date and time   

## 2018-02-03 ENCOUNTER — Other Ambulatory Visit: Payer: Self-pay | Admitting: Gastroenterology

## 2018-02-04 ENCOUNTER — Ambulatory Visit (HOSPITAL_COMMUNITY)
Admission: RE | Admit: 2018-02-04 | Discharge: 2018-02-04 | Disposition: A | Discharge status: Home / Self Care | Payer: BLUE CROSS/BLUE SHIELD | Source: Ambulatory Visit | Attending: Hematology | Admitting: Hematology

## 2018-02-04 ENCOUNTER — Encounter: Payer: Self-pay | Admitting: Hematology

## 2018-02-04 ENCOUNTER — Inpatient Hospital Stay: Payer: BLUE CROSS/BLUE SHIELD

## 2018-02-04 ENCOUNTER — Inpatient Hospital Stay: Payer: BLUE CROSS/BLUE SHIELD | Attending: Oncology | Admitting: Hematology

## 2018-02-04 VITALS — BP 100/70 | HR 82 | Temp 97.7°F | Resp 18 | Ht 67.0 in | Wt 142.5 lb

## 2018-02-04 DIAGNOSIS — C787 Secondary malignant neoplasm of liver and intrahepatic bile duct: Secondary | ICD-10-CM | POA: Diagnosis not present

## 2018-02-04 DIAGNOSIS — G893 Neoplasm related pain (acute) (chronic): Secondary | ICD-10-CM | POA: Diagnosis not present

## 2018-02-04 DIAGNOSIS — C7951 Secondary malignant neoplasm of bone: Secondary | ICD-10-CM | POA: Diagnosis not present

## 2018-02-04 DIAGNOSIS — R1032 Left lower quadrant pain: Secondary | ICD-10-CM

## 2018-02-04 DIAGNOSIS — I10 Essential (primary) hypertension: Secondary | ICD-10-CM | POA: Insufficient documentation

## 2018-02-04 DIAGNOSIS — K831 Obstruction of bile duct: Secondary | ICD-10-CM | POA: Insufficient documentation

## 2018-02-04 DIAGNOSIS — F418 Other specified anxiety disorders: Secondary | ICD-10-CM | POA: Insufficient documentation

## 2018-02-04 DIAGNOSIS — C779 Secondary and unspecified malignant neoplasm of lymph node, unspecified: Secondary | ICD-10-CM

## 2018-02-04 DIAGNOSIS — C221 Intrahepatic bile duct carcinoma: Secondary | ICD-10-CM | POA: Insufficient documentation

## 2018-02-04 DIAGNOSIS — Z720 Tobacco use: Secondary | ICD-10-CM

## 2018-02-04 DIAGNOSIS — R1011 Right upper quadrant pain: Secondary | ICD-10-CM

## 2018-02-04 DIAGNOSIS — R5383 Other fatigue: Secondary | ICD-10-CM

## 2018-02-04 DIAGNOSIS — R188 Other ascites: Secondary | ICD-10-CM | POA: Diagnosis not present

## 2018-02-04 DIAGNOSIS — R600 Localized edema: Secondary | ICD-10-CM

## 2018-02-04 DIAGNOSIS — C50811 Malignant neoplasm of overlapping sites of right female breast: Secondary | ICD-10-CM

## 2018-02-04 DIAGNOSIS — Z170 Estrogen receptor positive status [ER+]: Secondary | ICD-10-CM

## 2018-02-04 LAB — CULTURE, BODY FLUID W GRAM STAIN -BOTTLE

## 2018-02-04 LAB — CULTURE, BODY FLUID-BOTTLE

## 2018-02-04 NOTE — Progress Notes (Signed)
Bilateral lower extremity venous duplex has been completed. Negative for DVT. Results were given to Dr. Burr Medico.  5:09 PM 02/04/18 Caitlin Pollard

## 2018-02-04 NOTE — Progress Notes (Signed)
ED Antimicrobial Stewardship Positive Culture Follow Up   Caitlin Pollard is an 58 y.o. female who presented to [facility] on 01/31/2018 with a chief complaint of  Chief Complaint  Patient presents with  . Abnormal Lab  . Ascites    Recent Results (from the past 720 hour(s))  Culture, body fluid-bottle     Status: Abnormal   Collection Time: 01/31/18  5:31 PM  Result Value Ref Range Status   Specimen Description PERITONEAL  Final   Special Requests NONE  Final   Gram Stain   Final    GRAM POSITIVE COCCI IN CHAINS IN BOTH AEROBIC AND ANAEROBIC BOTTLES CRITICAL RESULT CALLED TO, READ BACK BY AND VERIFIED WITH: M. FINNS RN, AT 1017 02/01/18 BY Rush Landmark Performed at [facility], [place] [address], [place], [place] [address]    Culture STREPTOCOCCUS MITIS/ORALIS (A)  Final   Report Status 02/04/2018 FINAL  Final   Organism ID, Bacteria STREPTOCOCCUS MITIS/ORALIS  Final      Susceptibility   Streptococcus mitis/oralis - MIC*    TETRACYCLINE >=16 RESISTANT Resistant     VANCOMYCIN 0.5 SENSITIVE Sensitive     CLINDAMYCIN <=0.25 SENSITIVE Sensitive     PENICILLIN (non-meningitis) SENSITIVE Sensitive     * STREPTOCOCCUS MITIS/ORALIS  Gram stain     Status: None   Collection Time: 01/31/18  5:31 PM  Result Value Ref Range Status   Specimen Description PERITONEAL  Final   Special Requests NONE  Final   Gram Stain   Final    RARE WBC PRESENT, PREDOMINANTLY PMN NO ORGANISMS SEEN Performed at [facility], [place] [address] [place], [place] [address]    Report Status 01/31/2018 FINAL  Final  Fungus Culture With Stain     Status: None (Preliminary result)   Collection Time: 01/31/18  5:51 PM  Result Value Ref Range Status   Fungus Stain Final report  Final    Comment: (NOTE) Performed At: [facility] [place], [place] [id number] Rush Farmer MD NO:[id number]    Fungus (Mycology) Culture PENDING  Incomplete   Fungal Source PERITONEAL  Final   Comment: Performed at [facility], [place] [address] [place], [place] [address]  Fungus Culture Result     Status: None   Collection Time: 01/31/18  5:51 PM  Result Value Ref Range Status   Result 1 Comment  Final    Comment: (NOTE) KOH/Calcofluor preparation:  no fungus observed. Performed At: [facility] [address] [place], [place] [id number] Rush Farmer MD KC:[id number]     []  Treated with, organism resistant to prescribed antimicrobial [x]  Patient discharged originally without antimicrobial agent and treatment is now indicated.   Patient has an oncology appointment scheduled today, sent message to Valda Favia, RN and Dr. Burr Medico to make sure Dr. Burr Medico was aware of culture result.  Harrietta Guardian, PharmD PGY1 Pharmacy Resident 02/04/2018    9:20 AM Monday - Friday phone -  [phone] Saturday - Sunday phone - [phone]

## 2018-02-05 ENCOUNTER — Telehealth: Payer: Self-pay | Admitting: Emergency Medicine

## 2018-02-05 NOTE — Telephone Encounter (Signed)
Post ED Visit - Positive Culture Follow-up  Culture report reviewed by antimicrobial stewardship pharmacist:  []  Elenor Quinones, Pharm.D. []  Heide Guile, Pharm.D., BCPS AQ-ID []  Parks Neptune, Pharm.D., BCPS []  Alycia Rossetti, Pharm.D., BCPS []  [place], Pharm.D., BCPS, AAHIVP []  Legrand Como, Pharm.D., BCPS, AAHIVP [x]  Salome Arnt, PharmD, BCPS []  Johnnette Gourd, PharmD, BCPS []  Hughes Better, PharmD, BCPS []  Leeroy Cha, PharmD  Positive Body fluid culture Oncologist already aware and instructed patient to go to ED if fever develops. Most likley contaminant. No  further patient follow-up is required at this time.  Larene Beach Evert Wenrich 02/05/2018, 9:56 AM

## 2018-02-07 ENCOUNTER — Other Ambulatory Visit: Payer: Self-pay | Admitting: Gastroenterology

## 2018-02-07 ENCOUNTER — Telehealth: Payer: Self-pay | Admitting: Hematology

## 2018-02-07 NOTE — Telephone Encounter (Signed)
No los per 01/10.

## 2018-02-08 ENCOUNTER — Encounter (HOSPITAL_COMMUNITY): Payer: Self-pay | Admitting: *Deleted

## 2018-02-08 ENCOUNTER — Other Ambulatory Visit: Payer: Self-pay

## 2018-02-09 ENCOUNTER — Ambulatory Visit (HOSPITAL_COMMUNITY): Payer: BLUE CROSS/BLUE SHIELD

## 2018-02-09 ENCOUNTER — Encounter (HOSPITAL_COMMUNITY)
Admission: RE | Disposition: A | Discharge status: Home / Self Care | Payer: Self-pay | Source: Home / Self Care | Attending: Gastroenterology

## 2018-02-09 ENCOUNTER — Ambulatory Visit (HOSPITAL_COMMUNITY): Payer: BLUE CROSS/BLUE SHIELD | Admitting: Anesthesiology

## 2018-02-09 ENCOUNTER — Ambulatory Visit (HOSPITAL_COMMUNITY)
Admission: RE | Admit: 2018-02-09 | Discharge: 2018-02-09 | Disposition: A | Discharge status: Home / Self Care | Payer: BLUE CROSS/BLUE SHIELD | Source: Home / Self Care | Attending: Gastroenterology | Admitting: Gastroenterology

## 2018-02-09 ENCOUNTER — Other Ambulatory Visit: Payer: Self-pay

## 2018-02-09 ENCOUNTER — Ambulatory Visit (HOSPITAL_COMMUNITY)
Admission: RE | Admit: 2018-02-09 | Discharge: 2018-02-09 | Disposition: A | Discharge status: Home / Self Care | Payer: BLUE CROSS/BLUE SHIELD | Attending: Gastroenterology | Admitting: Gastroenterology

## 2018-02-09 ENCOUNTER — Other Ambulatory Visit: Payer: Self-pay | Admitting: Radiology

## 2018-02-09 ENCOUNTER — Encounter (HOSPITAL_COMMUNITY): Payer: Self-pay | Admitting: *Deleted

## 2018-02-09 DIAGNOSIS — C787 Secondary malignant neoplasm of liver and intrahepatic bile duct: Secondary | ICD-10-CM | POA: Diagnosis not present

## 2018-02-09 DIAGNOSIS — K831 Obstruction of bile duct: Secondary | ICD-10-CM | POA: Diagnosis not present

## 2018-02-09 DIAGNOSIS — Z79891 Long term (current) use of opiate analgesic: Secondary | ICD-10-CM | POA: Insufficient documentation

## 2018-02-09 DIAGNOSIS — Z79899 Other long term (current) drug therapy: Secondary | ICD-10-CM | POA: Diagnosis not present

## 2018-02-09 DIAGNOSIS — F419 Anxiety disorder, unspecified: Secondary | ICD-10-CM | POA: Diagnosis not present

## 2018-02-09 DIAGNOSIS — Y831 Surgical operation with implant of artificial internal device as the cause of abnormal reaction of the patient, or of later complication, without mention of misadventure at the time of the procedure: Secondary | ICD-10-CM | POA: Diagnosis not present

## 2018-02-09 DIAGNOSIS — I10 Essential (primary) hypertension: Secondary | ICD-10-CM | POA: Diagnosis not present

## 2018-02-09 DIAGNOSIS — R748 Abnormal levels of other serum enzymes: Secondary | ICD-10-CM | POA: Diagnosis present

## 2018-02-09 DIAGNOSIS — Z853 Personal history of malignant neoplasm of breast: Secondary | ICD-10-CM | POA: Diagnosis not present

## 2018-02-09 DIAGNOSIS — T85590A Other mechanical complication of bile duct prosthesis, initial encounter: Secondary | ICD-10-CM | POA: Diagnosis not present

## 2018-02-09 DIAGNOSIS — C779 Secondary and unspecified malignant neoplasm of lymph node, unspecified: Secondary | ICD-10-CM | POA: Diagnosis not present

## 2018-02-09 DIAGNOSIS — C221 Intrahepatic bile duct carcinoma: Secondary | ICD-10-CM | POA: Insufficient documentation

## 2018-02-09 DIAGNOSIS — Z87891 Personal history of nicotine dependence: Secondary | ICD-10-CM | POA: Diagnosis not present

## 2018-02-09 DIAGNOSIS — F329 Major depressive disorder, single episode, unspecified: Secondary | ICD-10-CM | POA: Insufficient documentation

## 2018-02-09 DIAGNOSIS — R188 Other ascites: Secondary | ICD-10-CM

## 2018-02-09 DIAGNOSIS — C7951 Secondary malignant neoplasm of bone: Secondary | ICD-10-CM | POA: Insufficient documentation

## 2018-02-09 HISTORY — PX: REMOVAL OF STONES: SHX5545

## 2018-02-09 HISTORY — DX: Other specified complications of surgical and medical care, not elsewhere classified, initial encounter: T88.8XXA

## 2018-02-09 HISTORY — PX: ERCP: SHX5425

## 2018-02-09 HISTORY — PX: PARACENTESIS: SHX5436

## 2018-02-09 HISTORY — PX: BILIARY STENT PLACEMENT: SHX5538

## 2018-02-09 SURGERY — ERCP, WITH INTERVENTION IF INDICATED
Anesthesia: Monitor Anesthesia Care

## 2018-02-09 MED ORDER — CIPROFLOXACIN IN D5W 400 MG/200ML IV SOLN
INTRAVENOUS | Status: AC
  Filled 2018-02-09: qty 200

## 2018-02-09 MED ORDER — PROPOFOL 10 MG/ML IV BOLUS
INTRAVENOUS | Status: DC | PRN
  Administered 2018-02-09 (×2): 100 mg via INTRAVENOUS

## 2018-02-09 MED ORDER — SODIUM CHLORIDE 0.9 % IV SOLN: INTRAVENOUS | Status: DC

## 2018-02-09 MED ORDER — LIDOCAINE HCL 1 % IJ SOLN
INTRAMUSCULAR | Status: AC
  Filled 2018-02-09: qty 20

## 2018-02-09 MED ORDER — ALBUMIN HUMAN 25 % IV SOLN
50.0000 g | Freq: Once | INTRAVENOUS | Status: AC
  Administered 2018-02-09: 50 g via INTRAVENOUS
  Filled 2018-02-09: qty 200

## 2018-02-09 MED ORDER — PROMETHAZINE HCL 25 MG/ML IJ SOLN: 6.2500 mg | INTRAMUSCULAR | Status: DC | PRN

## 2018-02-09 MED ORDER — FENTANYL CITRATE (PF) 100 MCG/2ML IJ SOLN
INTRAMUSCULAR | Status: AC
  Filled 2018-02-09: qty 2

## 2018-02-09 MED ORDER — OXYCODONE HCL 5 MG PO TABS: 5.0000 mg | ORAL_TABLET | Freq: Once | ORAL | Status: DC

## 2018-02-09 MED ORDER — ONDANSETRON HCL 4 MG/2ML IJ SOLN
INTRAMUSCULAR | Status: DC | PRN
  Administered 2018-02-09: 4 mg via INTRAVENOUS

## 2018-02-09 MED ORDER — DEXAMETHASONE SODIUM PHOSPHATE 10 MG/ML IJ SOLN
INTRAMUSCULAR | Status: DC | PRN
  Administered 2018-02-09: 10 mg via INTRAVENOUS

## 2018-02-09 MED ORDER — LIDOCAINE 2% (20 MG/ML) 5 ML SYRINGE
INTRAMUSCULAR | Status: DC | PRN
  Administered 2018-02-09: 60 mg via INTRAVENOUS

## 2018-02-09 MED ORDER — ROCURONIUM BROMIDE 10 MG/ML (PF) SYRINGE
PREFILLED_SYRINGE | INTRAVENOUS | Status: DC | PRN
  Administered 2018-02-09: 50 mg via INTRAVENOUS

## 2018-02-09 MED ORDER — SUGAMMADEX SODIUM 200 MG/2ML IV SOLN
INTRAVENOUS | Status: DC | PRN
  Administered 2018-02-09: 200 mg via INTRAVENOUS

## 2018-02-09 MED ORDER — FENTANYL CITRATE (PF) 250 MCG/5ML IJ SOLN
INTRAMUSCULAR | Status: DC | PRN
  Administered 2018-02-09 (×2): 25 ug via INTRAVENOUS
  Administered 2018-02-09 (×3): 50 ug via INTRAVENOUS

## 2018-02-09 MED ORDER — MIDAZOLAM HCL 2 MG/2ML IJ SOLN
INTRAMUSCULAR | Status: AC
  Filled 2018-02-09: qty 2

## 2018-02-09 MED ORDER — MIDAZOLAM HCL 2 MG/2ML IJ SOLN
INTRAMUSCULAR | Status: DC | PRN
  Administered 2018-02-09: 2 mg via INTRAVENOUS

## 2018-02-09 MED ORDER — ALBUMIN HUMAN 25 % IV SOLN
50.0000 g | Freq: Once | INTRAVENOUS | Status: DC
  Filled 2018-02-09: qty 200

## 2018-02-09 MED ORDER — LACTATED RINGERS IV SOLN
INTRAVENOUS | Status: DC | PRN
  Administered 2018-02-09: 13:00:00 via INTRAVENOUS

## 2018-02-09 MED ORDER — PROPOFOL 10 MG/ML IV BOLUS
INTRAVENOUS | Status: AC
  Filled 2018-02-09: qty 20

## 2018-02-09 MED ORDER — SUCCINYLCHOLINE CHLORIDE 200 MG/10ML IV SOSY
PREFILLED_SYRINGE | INTRAVENOUS | Status: DC | PRN
  Administered 2018-02-09: 100 mg via INTRAVENOUS

## 2018-02-09 MED ORDER — CIPROFLOXACIN IN D5W 400 MG/200ML IV SOLN
400.0000 mg | Freq: Once | INTRAVENOUS | Status: AC
  Administered 2018-02-09: 400 mg via INTRAVENOUS

## 2018-02-09 SURGICAL SUPPLY — 1 items: Wallflex Biliary Uncovered Stent ×2 IMPLANT

## 2018-02-09 NOTE — Discharge Instructions (Signed)
YOU HAD AN ENDOSCOPIC PROCEDURE TODAY: Refer to the procedure report and other information in the discharge instructions given to you for any specific questions about what was found during the examination. If this information does not answer your questions, please call [facility] office at [phone] to clarify.   YOU SHOULD EXPECT: Some feelings of bloating in the abdomen. Passage of more gas than usual. Walking can help get rid of the air that was put into your GI tract during the procedure and reduce the bloating. If you had a lower endoscopy (such as a colonoscopy or flexible sigmoidoscopy) you may notice spotting of blood in your stool or on the toilet paper. Some abdominal soreness may be present for a day or two, also.  DIET: Your first meal following the procedure should be a light meal and then it is ok to progress to your normal diet. A half-sandwich or bowl of soup is an example of a good first meal. Heavy or fried foods are harder to digest and may make you feel nauseous or bloated. Drink plenty of fluids but you should avoid alcoholic beverages for 24 hours. If you had a esophageal dilation, please see attached instructions for diet.   ACTIVITY: Your care partner should take you home directly after the procedure. You should plan to take it easy, moving slowly for the rest of the day. You can resume normal activity the day after the procedure however YOU SHOULD NOT DRIVE, use power tools, machinery or perform tasks that involve climbing or major physical exertion for 24 hours (because of the sedation medicines used during the test).   SYMPTOMS TO REPORT IMMEDIATELY: A gastroenterologist can be reached at any hour. Please call [phone]  for any of the following symptoms:    Following upper endoscopy (EGD, EUS, ERCP, esophageal dilation) Vomiting of blood or coffee ground material  New, significant abdominal pain  New, significant chest pain or pain under the shoulder blades  Painful or  persistently difficult swallowing  New shortness of breath  Black, tarry-looking or red, bloody stools  FOLLOW UP:  If any biopsies were taken you will be contacted by phone or by letter within the next 1-3 weeks. Call [phone]  if you have not heard about the biopsies in 3 weeks.  Please also call with any specific questions about appointments or follow up tests.   Clear liquid diet until 8 PM and if doing well may have soft solids and call if question or problem otherwise happy to see back after visit to [place] clinic

## 2018-02-09 NOTE — Transfer of Care (Signed)
Immediate Anesthesia Transfer of Care Note  Patient: Caitlin Pollard  Procedure(s) Performed: ENDOSCOPIC RETROGRADE CHOLANGIOPANCREATOGRAPHY (ERCP) (N/A ) REMOVAL OF STONES BILIARY DILATION BILIARY STENT PLACEMENT (N/A )  Patient Location: PACU and Endoscopy Unit  Anesthesia Type:General  Level of Consciousness: awake, alert  and sedated  Airway & Oxygen Therapy: Patient Spontanous Breathing and Patient connected to face mask oxygen  Post-op Assessment: Report given to RN, Post -op Vital signs reviewed and stable and Patient moving all extremities  Post vital signs: Reviewed and stable  Last Vitals:  Vitals Value Taken Time  BP 155/91 02/09/2018  2:35 PM  Temp    Pulse 96 02/09/2018  2:39 PM  Resp 27 02/09/2018  2:39 PM  SpO2 100 % 02/09/2018  2:39 PM  Vitals shown include unvalidated device data.  Last Pain:  Vitals:   02/09/18 1435  TempSrc: Oral  PainSc: 0-No pain         Complications: No apparent anesthesia complications

## 2018-02-09 NOTE — Anesthesia Procedure Notes (Addendum)
Procedure Name: Intubation Performed by: Eben Burow, CRNA Pre-anesthesia Checklist: Patient identified, Emergency Drugs available, Suction available, Patient being monitored and Timeout performed Patient Re-evaluated:Patient Re-evaluated prior to induction Oxygen Delivery Method: Circle system utilized Preoxygenation: Pre-oxygenation with 100% oxygen Induction Type: IV induction Ventilation: Mask ventilation without difficulty Laryngoscope Size: Mac and 4 Grade View: Grade I Tube type: Oral Number of attempts: 1 Airway Equipment and Method: Stylet Placement Confirmation: ETT inserted through vocal cords under direct vision,  positive ETCO2 and breath sounds checked- equal and bilateral Secured at: 21 cm Tube secured with: Tape Dental Injury: Teeth and Oropharynx as per pre-operative assessment

## 2018-02-09 NOTE — Op Note (Signed)
[facility] Patient Name: Caitlin Pollard Procedure Date: 02/09/2018 MRN: [id number] Attending MD: Clarene Essex , MD Date of Birth: 08-06-1960 CSN: [id number] Age: 58 Admit Type: Inpatient Procedure:                ERCP Indications:              Biliary stent with some distal sludge on Computed                            Tomogram Scan, Elevated liver enzymes metastatic                            cancer previous stenting Providers:                Clarene Essex, MD, Elna Breslow, RN, Cherylynn Ridges,                            Technician, Courtney Heys. [place], CRNA Referring MD:              Medicines:                General Anesthesia Complications:            No immediate complications. Estimated Blood Loss:     Estimated blood loss: none. Procedure:                Pre-Anesthesia Assessment:                           - Prior to the procedure, a History and Physical                            was performed, and patient medications and                            allergies were reviewed. The patient's tolerance of                            previous anesthesia was also reviewed. The risks                            and benefits of the procedure and the sedation                            options and risks were discussed with the patient.                            All questions were answered, and informed consent                            was obtained. Prior Anticoagulants: The patient has                            taken no previous anticoagulant or antiplatelet                            agents.  ASA Grade Assessment: III - A patient with                            severe systemic disease. After reviewing the risks                            and benefits, the patient was deemed in                            satisfactory condition to undergo the procedure.                           After obtaining informed consent, the scope was                            passed under direct  vision. Throughout the                            procedure, the patient's blood pressure, pulse, and                            oxygen saturations were monitored continuously. The                            TJF-Q180V ([id number] Olympus duodenoscope was                            introduced through the mouth, and used to inject                            contrast into and used to cannulate the bile duct.                            The ERCP was technically difficult and complex due                            to abnormal anatomy. Successful completion of the                            procedure was aided by performing the maneuvers                            documented (below) in this report. The patient                            tolerated the procedure well. Scope In: Scope Out: Findings:      One uncovered metal stent originating in the biliary tree was emerging       from the major papilla. The stent was partially occluded with sludge.       The biliary tree was swept with an adjustable 9x 12 mm balloon starting       at the upper third of the main bile duct. Sludge was swept from the       duct. We proceeded  with an occlusion cholangiogram after multiple       balloon pull-through's and there were no proximal dilated segments that       required stenting however there seemed to be a short stricture at the       overlap of the 2 stents which did not drain very well and we elected to       overlap another stent there unfortunately we had difficulty passing the       stent above we proceeded with balloon dilation of the common bile duct       with a 4 cm by 10 mm balloon dilator which was successful. We were then       able to place one 10 Fr by 6 cm uncovered metal stent was placed 5.5 cm       into the common bile duct across the above-mentioned stricture. The       stent was in good position. There was adequate biliary drainage and       throughout the procedure there was no pancreatic  duct injection or wire       advancement Impression:               - One partially occluded stent from the biliary                            tree was seen in the major papilla.                           - The biliary tree was swept and sludge was found.                            A short stricture was seen by the stent overlaps                            and was dilated as above                           - Common bile duct was successfully dilated.                           - One uncovered metal stent was placed into the                            common bile duct across stricture above. No                            pancreatic duct injection or wire advancement                            throughout the procedure Moderate Sedation:      Not Applicable - Patient had care per Anesthesia. Recommendation:           - Clear liquid diet for 6 hours.                           - Continue present medications.                           -  Return to GI clinic PRN.                           - Telephone GI clinic if symptomatic PRN. Recheck                            liver tests in [place] Procedure Code(s):        --- Professional ---                           [phone], Endoscopic retrograde                            cholangiopancreatography (ERCP); with removal and                            exchange of stent(s), biliary or pancreatic duct,                            including pre- and post-dilation and guide wire                            passage, when performed, including sphincterotomy,                            when performed, each stent exchanged                           [id number], Endoscopic retrograde                            cholangiopancreatography (ERCP); with removal of                            calculi/debris from biliary/pancreatic duct(s) Diagnosis Code(s):        --- Professional ---                           O13.086V, Other mechanical complication of bile                             duct prosthesis, initial encounter                           R74.8, Abnormal levels of other serum enzymes                           K83.8, Other specified diseases of biliary tract CPT copyright 2018 American Medical Association. All rights reserved. The codes documented in this report are preliminary and upon coder review may  be revised to meet current compliance requirements. Clarene Essex, MD 02/09/2018 2:33:19 PM This report has been signed electronically. Number of Addenda: 0

## 2018-02-09 NOTE — Procedures (Signed)
  PROCEDURE SUMMARY:  Successful US guided paracentesis from LLQ.  Yielded 5.3 L of clear yellow fluid.  No immediate complications.  Pt tolerated well.   Specimen was sent for labs.  EBL < 86mL  Ascencion Dike PA-C 02/09/2018 4:10 PM

## 2018-02-09 NOTE — Anesthesia Postprocedure Evaluation (Signed)
Anesthesia Post Note  Patient: Caitlin Pollard  Procedure(s) Performed: ENDOSCOPIC RETROGRADE CHOLANGIOPANCREATOGRAPHY (ERCP) (N/A ) REMOVAL OF STONES BILIARY DILATION BILIARY STENT PLACEMENT (N/A )     Patient location during evaluation: Endoscopy Anesthesia Type: MAC Level of consciousness: awake and alert Pain management: pain level controlled Vital Signs Assessment: post-procedure vital signs reviewed and stable Respiratory status: spontaneous breathing, nonlabored ventilation and respiratory function stable Cardiovascular status: stable and blood pressure returned to baseline Postop Assessment: no apparent nausea or vomiting Anesthetic complications: no    Last Vitals:  Vitals:   02/09/18 1440 02/09/18 1450  BP: 112/85 (!) 122/96  Pulse: 96 98  Resp: (!) 32 (!) 27  Temp:    SpO2: 100% 99%    Last Pain:  Vitals:   02/09/18 1435  TempSrc: Oral  PainSc: 0-No pain                 Lynda Rainwater

## 2018-02-09 NOTE — Progress Notes (Signed)
Caitlin Pollard 12:39 PM  Subjective: Patient with increased ascites and more discomfort than when I saw her last week in the office no other new complaints we answered all of her and her family's questions and discussed yeast from antibiotics  Objective: Vital signs stable afebrile no acute distress exam please see preassessment evaluation increased ascites on exam  Assessment: Metastatic cancer with some rising bilirubin want to reevaluate the stent and make sure no other drainable segment is endoscopically reachable Plan: Okay to proceed with ERCP with anesthesia assistance and will check with IR to see if they can move up her paracentesis  [facility] E  Pager [phone] After 5PM or if no answer call [phone]

## 2018-02-09 NOTE — Anesthesia Preprocedure Evaluation (Addendum)
Anesthesia Evaluation  Patient identified by MRN, date of birth, ID band Patient awake    Reviewed: Allergy & Precautions, NPO status , Patient's Chart, lab work & pertinent test results  Airway Mallampati: II  TM Distance: >3 FB Neck ROM: Full    Dental no notable dental hx. (+) Teeth Intact, Dental Advisory Given   Pulmonary neg pulmonary ROS, former smoker,    Pulmonary exam normal breath sounds clear to auscultation       Cardiovascular hypertension, Pt. on medications Normal cardiovascular exam Rhythm:Regular Rate:Normal     Neuro/Psych Anxiety Depression negative neurological ROS     GI/Hepatic negative GI ROS, (+)     substance abuse  ,   Endo/Other  negative endocrine ROS  Renal/GU negative Renal ROS     Musculoskeletal negative musculoskeletal ROS (+) narcotic dependent  Abdominal   Peds  Hematology negative hematology ROS (+)   Anesthesia Other Findings Metastatic cholangiocarcinoma  Reproductive/Obstetrics                             Anesthesia Physical  Anesthesia Plan  ASA: IV  Anesthesia Plan: General   Post-op Pain Management:    Induction: Intravenous, Rapid sequence and Cricoid pressure planned  PONV Risk Score and Plan: 2 and Treatment may vary due to age or medical condition and Propofol infusion  Airway Management Planned: Oral ETT  Additional Equipment:   Intra-op Plan:   Post-operative Plan: Extubation in OR  Informed Consent: I have reviewed the patients History and Physical, chart, labs and discussed the procedure including the risks, benefits and alternatives for the proposed anesthesia with the patient or authorized representative who has indicated his/her understanding and acceptance.       Plan Discussed with: CRNA  Anesthesia Plan Comments:        Anesthesia Quick Evaluation

## 2018-02-10 ENCOUNTER — Telehealth: Payer: Self-pay

## 2018-02-10 NOTE — Telephone Encounter (Signed)
Patient's husband called with update had ERPC done and paracentesis yesterday removed 5.3 liters of fluid, instilled 4 small bottles of albumin, recent labs are in the system for Dr. Ernestina Penna review (made her aware), leaving on Sunday for [facility] clinic.  Instructed him to call if our we can be of any assistance, he verbalized an understanding.

## 2018-02-11 ENCOUNTER — Encounter (HOSPITAL_COMMUNITY): Payer: Self-pay

## 2018-02-11 ENCOUNTER — Ambulatory Visit (HOSPITAL_COMMUNITY): Payer: BLUE CROSS/BLUE SHIELD

## 2018-02-14 ENCOUNTER — Telehealth: Payer: Self-pay

## 2018-02-14 ENCOUNTER — Encounter (HOSPITAL_COMMUNITY): Payer: Self-pay | Admitting: Gastroenterology

## 2018-02-14 NOTE — Telephone Encounter (Signed)
Patient's daughter calls stating they went to [facility], her blood test showed sodium had decreased even further and potassium had spiked.  They are on the way home and the patient/husband are requesting an appointment for Wednesday for labs and to see Dr. Burr Medico.    [phone]

## 2018-02-15 MED ORDER — ONDANSETRON HCL 4 MG PO TABS
8.00 | ORAL_TABLET | ORAL | Status: DC
Start: ? — End: 2018-02-15

## 2018-02-15 MED ORDER — IOHEXOL 350 MG/ML SOLN
100.00 | INTRAVENOUS | Status: DC
Start: ? — End: 2018-02-15

## 2018-02-15 MED ORDER — SODIUM CHLORIDE FLUSH 0.9 % IV SOLN
3.00 | INTRAVENOUS | Status: DC
Start: ? — End: 2018-02-15

## 2018-02-15 MED ORDER — SODIUM CHLORIDE FLUSH 0.9 % IV SOLN
3.00 | INTRAVENOUS | Status: DC
Start: 2018-02-22 — End: 2018-02-15

## 2018-02-15 MED ORDER — SODIUM CHLORIDE FLUSH 0.9 % IV SOLN
10.00 | INTRAVENOUS | Status: DC
Start: ? — End: 2018-02-15

## 2018-02-15 MED ORDER — NALOXONE HCL 0.4 MG/ML IJ SOLN
.20 | INTRAMUSCULAR | Status: DC
Start: ? — End: 2018-02-15

## 2018-02-15 MED ORDER — MORPHINE SULFATE ER 15 MG PO TBCR
15.00 | EXTENDED_RELEASE_TABLET | ORAL | Status: DC
Start: ? — End: 2018-02-15

## 2018-02-15 MED ORDER — INSULIN ASPART 100 UNIT/ML FLEXPEN
0.00 | PEN_INJECTOR | SUBCUTANEOUS | Status: DC
Start: ? — End: 2018-02-15

## 2018-02-15 MED ORDER — GENERIC EXTERNAL MEDICATION
3.38 | Status: DC
Start: 2018-02-16 — End: 2018-02-15

## 2018-02-15 MED ORDER — LORAZEPAM 0.5 MG PO TABS
.50 | ORAL_TABLET | ORAL | Status: DC
Start: ? — End: 2018-02-15

## 2018-02-15 MED ORDER — FLUCONAZOLE 200 MG PO TABS
200.00 | ORAL_TABLET | ORAL | Status: DC
Start: 2018-02-16 — End: 2018-02-15

## 2018-02-15 MED ORDER — PANTOPRAZOLE SODIUM 40 MG PO TBEC
40.00 | DELAYED_RELEASE_TABLET | ORAL | Status: DC
Start: 2018-02-23 — End: 2018-02-15

## 2018-02-15 MED ORDER — POLYETHYLENE GLYCOL 3350 17 G PO PACK
17.00 | PACK | ORAL | Status: DC
Start: 2018-02-23 — End: 2018-02-15

## 2018-02-15 MED ORDER — CLOTRIMAZOLE 10 MG MT TROC
10.00 | OROMUCOSAL | Status: DC
Start: 2018-02-22 — End: 2018-02-15

## 2018-02-15 MED ORDER — FLUOXETINE HCL 10 MG PO CAPS
10.00 | ORAL_CAPSULE | ORAL | Status: DC
Start: 2018-02-23 — End: 2018-02-15

## 2018-02-15 MED ORDER — OXYCODONE HCL 5 MG PO TABS
5.00 | ORAL_TABLET | ORAL | Status: DC
Start: ? — End: 2018-02-15

## 2018-02-16 ENCOUNTER — Telehealth: Payer: Self-pay

## 2018-02-16 ENCOUNTER — Other Ambulatory Visit: Payer: BLUE CROSS/BLUE SHIELD

## 2018-02-16 ENCOUNTER — Ambulatory Visit: Payer: BLUE CROSS/BLUE SHIELD | Admitting: Hematology

## 2018-02-16 NOTE — Telephone Encounter (Signed)
Patient's daughter calls to cancel her appointments for today due to her mother being admitted to the hospital in [place] for sepsis, getting IV antibiotics.  Dr. Burr Medico made aware, scheduling message has been sent.

## 2018-02-18 ENCOUNTER — Telehealth: Payer: Self-pay

## 2018-02-18 NOTE — Telephone Encounter (Signed)
Case Manager with [facility] calls stating when the patient is discharged (date uncertain) she will need to be on IV Zosyn and Vancomycin.  Wanting to know if Dr. Burr Medico would sign the orders.  Consulted with Dr. Burr Medico and called her back to let her know Dr. Burr Medico agrees to sign.  The husband has picked [facility]. Our fax number was provided to them.

## 2018-02-21 ENCOUNTER — Other Ambulatory Visit: Payer: Self-pay | Admitting: Hematology

## 2018-02-21 DIAGNOSIS — C7951 Secondary malignant neoplasm of bone: Principal | ICD-10-CM

## 2018-02-21 DIAGNOSIS — C221 Intrahepatic bile duct carcinoma: Secondary | ICD-10-CM

## 2018-02-21 MED ORDER — GENERIC EXTERNAL MEDICATION
1.00 | Status: DC
Start: 2018-02-23 — End: 2018-02-21

## 2018-02-21 MED ORDER — MIDODRINE HCL 5 MG PO TABS
10.00 | ORAL_TABLET | ORAL | Status: DC
Start: 2018-02-22 — End: 2018-02-21

## 2018-02-21 MED ORDER — GENERIC EXTERNAL MEDICATION
2.00 | Status: DC
Start: 2018-02-23 — End: 2018-02-21

## 2018-02-22 ENCOUNTER — Other Ambulatory Visit: Payer: Self-pay | Admitting: Radiology

## 2018-02-23 ENCOUNTER — Ambulatory Visit (HOSPITAL_COMMUNITY)
Admission: RE | Admit: 2018-02-23 | Discharge: 2018-02-23 | Disposition: A | Discharge status: Home / Self Care | Payer: BLUE CROSS/BLUE SHIELD | Source: Ambulatory Visit

## 2018-02-23 ENCOUNTER — Other Ambulatory Visit: Payer: Self-pay

## 2018-02-23 ENCOUNTER — Other Ambulatory Visit: Payer: Self-pay | Admitting: Hematology

## 2018-02-23 ENCOUNTER — Telehealth: Payer: Self-pay

## 2018-02-23 ENCOUNTER — Encounter (HOSPITAL_COMMUNITY): Payer: Self-pay

## 2018-02-23 ENCOUNTER — Ambulatory Visit (HOSPITAL_COMMUNITY)
Admission: RE | Admit: 2018-02-23 | Discharge: 2018-02-23 | Disposition: A | Discharge status: Home / Self Care | Payer: BLUE CROSS/BLUE SHIELD | Source: Ambulatory Visit | Attending: Hematology | Admitting: Hematology

## 2018-02-23 DIAGNOSIS — C7951 Secondary malignant neoplasm of bone: Secondary | ICD-10-CM | POA: Insufficient documentation

## 2018-02-23 DIAGNOSIS — R180 Malignant ascites: Secondary | ICD-10-CM | POA: Insufficient documentation

## 2018-02-23 DIAGNOSIS — C221 Intrahepatic bile duct carcinoma: Secondary | ICD-10-CM | POA: Diagnosis present

## 2018-02-23 HISTORY — DX: Intrahepatic bile duct carcinoma: C22.1

## 2018-02-23 HISTORY — PX: IR PERC TUN PERIT CATH WO PORT S&I /IMAG: IMG2327

## 2018-02-23 LAB — CBC WITH DIFFERENTIAL/PLATELET
Abs Immature Granulocytes: 0.26 10*3/uL — ABNORMAL HIGH (ref 0.00–0.07)
Basophils Absolute: 0.1 10*3/uL (ref 0.0–0.1)
Basophils Relative: 0 %
Eosinophils Absolute: 0.1 10*3/uL (ref 0.0–0.5)
Eosinophils Relative: 1 %
HCT: 32.8 % — ABNORMAL LOW (ref 36.0–46.0)
Hemoglobin: 10.3 g/dL — ABNORMAL LOW (ref 12.0–15.0)
Immature Granulocytes: 1 %
Lymphocytes Relative: 2 %
Lymphs Abs: 0.4 10*3/uL — ABNORMAL LOW (ref 0.7–4.0)
MCH: 29.2 pg (ref 26.0–34.0)
MCHC: 31.4 g/dL (ref 30.0–36.0)
MCV: 92.9 fL (ref 80.0–100.0)
Monocytes Absolute: 1.6 10*3/uL — ABNORMAL HIGH (ref 0.1–1.0)
Monocytes Relative: 9 %
NEUTROS PCT: 87 %
Neutro Abs: 15.7 10*3/uL — ABNORMAL HIGH (ref 1.7–7.7)
Platelets: 175 10*3/uL (ref 150–400)
RBC: 3.53 MIL/uL — ABNORMAL LOW (ref 3.87–5.11)
RDW: 22.9 % — ABNORMAL HIGH (ref 11.5–15.5)
WBC: 18.1 10*3/uL — ABNORMAL HIGH (ref 4.0–10.5)
nRBC: 0 % (ref 0.0–0.2)

## 2018-02-23 LAB — COMPREHENSIVE METABOLIC PANEL
ALBUMIN: 3.1 g/dL — AB (ref 3.5–5.0)
ALT: 98 U/L — ABNORMAL HIGH (ref 0–44)
AST: 338 U/L — AB (ref 15–41)
Alkaline Phosphatase: 1042 U/L — ABNORMAL HIGH (ref 38–126)
Anion gap: 17 — ABNORMAL HIGH (ref 5–15)
BUN: 61 mg/dL — AB (ref 6–20)
CO2: 12 mmol/L — ABNORMAL LOW (ref 22–32)
Calcium: 8.4 mg/dL — ABNORMAL LOW (ref 8.9–10.3)
Chloride: 108 mmol/L (ref 98–111)
Creatinine, Ser: 4.75 mg/dL — ABNORMAL HIGH (ref 0.44–1.00)
GFR calc Af Amer: 11 mL/min — ABNORMAL LOW (ref >60)
GFR calc non Af Amer: 9 mL/min — ABNORMAL LOW (ref >60)
Glucose, Bld: 101 mg/dL — ABNORMAL HIGH (ref 70–99)
Potassium: 3.9 mmol/L (ref 3.5–5.1)
Sodium: 137 mmol/L (ref 135–145)
Total Bilirubin: 11.8 mg/dL — ABNORMAL HIGH (ref 0.3–1.2)
Total Protein: 5.3 g/dL — ABNORMAL LOW (ref 6.5–8.1)

## 2018-02-23 LAB — PROTIME-INR
INR: 1.42
Prothrombin Time: 17.1 seconds — ABNORMAL HIGH (ref 11.4–15.2)

## 2018-02-23 MED ORDER — MIDAZOLAM HCL 2 MG/2ML IJ SOLN
INTRAMUSCULAR | Status: AC
  Filled 2018-02-23: qty 4

## 2018-02-23 MED ORDER — CEFAZOLIN SODIUM-DEXTROSE 2-4 GM/100ML-% IV SOLN
INTRAVENOUS | Status: AC
  Administered 2018-02-23: 2 g via INTRAVENOUS
  Filled 2018-02-23: qty 100

## 2018-02-23 MED ORDER — HEPARIN SOD (PORK) LOCK FLUSH 100 UNIT/ML IV SOLN
250.0000 [IU] | INTRAVENOUS | Status: AC | PRN
  Administered 2018-02-23: 250 [IU]

## 2018-02-23 MED ORDER — MIDAZOLAM HCL 2 MG/2ML IJ SOLN
INTRAMUSCULAR | Status: AC | PRN
  Administered 2018-02-23: 0.5 mg via INTRAVENOUS

## 2018-02-23 MED ORDER — SODIUM CHLORIDE 0.9 % IV SOLN
INTRAVENOUS | Status: DC
  Administered 2018-02-23: 14:00:00 via INTRAVENOUS

## 2018-02-23 MED ORDER — CEFAZOLIN SODIUM-DEXTROSE 2-4 GM/100ML-% IV SOLN
2.0000 g | INTRAVENOUS | Status: AC
  Administered 2018-02-23: 2 g via INTRAVENOUS

## 2018-02-23 MED ORDER — FENTANYL CITRATE (PF) 100 MCG/2ML IJ SOLN
INTRAMUSCULAR | Status: AC
  Filled 2018-02-23: qty 2

## 2018-02-23 MED ORDER — HEPARIN SOD (PORK) LOCK FLUSH 100 UNIT/ML IV SOLN
INTRAVENOUS | Status: AC
  Filled 2018-02-23: qty 5

## 2018-02-23 MED ORDER — FENTANYL CITRATE (PF) 100 MCG/2ML IJ SOLN
INTRAMUSCULAR | Status: AC | PRN
  Administered 2018-02-23: 25 ug via INTRAVENOUS

## 2018-02-23 NOTE — Telephone Encounter (Signed)
Faxed signed order to [facility] for PleurX, placement date today 02/23/2018.

## 2018-02-23 NOTE — H&P (Signed)
Chief Complaint: Recurrent ascites  Referring Physician(s): Feng,Yan  Supervising Physician: Corrie Mckusick  Patient Status: [facility] - Out-pt  History of Present Illness: Caitlin Pollard is a 58 y.o. female with metastatic cholangiocarcinoma and recurrent ascites.  She underwent a paracentesis on 02/09/2018 with removal  Of 5.3 L of fluid.  She has required albumin to keep her BP within acceptable limits.  Her most recent paracentesis was done at another facility [facility] clinic) on Monday.  Her daughter, who is a PA, states that the fluid is now re-accumulating quickly and her abdomen is already distended again.  She is here today for placement of a tunneled peritoneal catheter so she can better manage her ascites at home.  She is NPO. No blood thinners but she has had an elevated INR secondary to her liver involvement.  No nausea/vomiting today. No Fever/chills today. She has had significant weight loss and looks very ill.   Past Medical History:  Diagnosis Date  . Breast cancer ([place])   . Breast cancer, stage 2 ([place]) 04/05/2011   Mestatic to liver,bones, possible gallbladder  . Cholangiocarcinoma ([place])   . Family history of lung cancer   . Fluid collection at surgical site    right side paracentesis site leaking at times put bandaid on, yellow fluid  . HTN (hypertension), benign 04/05/2011  . Hyperglycemia without ketosis 04/05/2011  . IBD (inflammatory bowel disease) 04/05/2011    Past Surgical History:  Procedure Laterality Date  . ABDOMINAL HYSTERECTOMY    . AUGMENTATION MAMMAPLASTY Bilateral   . BILIARY STENT PLACEMENT  11/17/2017   Procedure: BILIARY STENT PLACEMENT;  Surgeon: Arta Silence, MD;  Location: [facility] ENDOSCOPY;  Service: Endoscopy;;  . BILIARY STENT PLACEMENT N/A 02/09/2018   Procedure: BILIARY STENT PLACEMENT;  Surgeon: Clarene Essex, MD;  Location: [facility] ENDOSCOPY;  Service: Endoscopy;  Laterality: N/A;  . COLONOSCOPY    . ERCP N/A 11/17/2017   Procedure:  ENDOSCOPIC RETROGRADE CHOLANGIOPANCREATOGRAPHY (ERCP);  Surgeon: Arta Silence, MD;  Location: [facility] ENDOSCOPY;  Service: Endoscopy;  Laterality: N/A;  . ERCP N/A 02/09/2018   Procedure: ENDOSCOPIC RETROGRADE CHOLANGIOPANCREATOGRAPHY (ERCP);  Surgeon: Clarene Essex, MD;  Location: [facility] ENDOSCOPY;  Service: Endoscopy;  Laterality: N/A;  . ESOPHAGOGASTRODUODENOSCOPY (EGD) WITH PROPOFOL N/A 11/26/2017   Procedure: ESOPHAGOGASTRODUODENOSCOPY (EGD) WITH PROPOFOL;  Surgeon: Arta Silence, MD;  Location: [place];  Service: Endoscopy;  Laterality: N/A;  . EUS N/A 11/26/2017   Procedure: FULL UPPER ENDOSCOPIC ULTRASOUND (EUS) RADIAL;  Surgeon: Arta Silence, MD;  Location: [place];  Service: Endoscopy;  Laterality: N/A;  . IR IMAGING GUIDED PORT INSERTION  12/07/2017  . KNEE SURGERY Right    ACL   . MASTECTOMY Right   . PARACENTESIS  02/09/2018   Procedure: PARACENTESIS;  Surgeon: Clarene Essex, MD;  Location: [facility] ENDOSCOPY;  Service: Endoscopy;;  . REMOVAL OF STONES  02/09/2018   Procedure: REMOVAL OF STONES;  Surgeon: Clarene Essex, MD;  Location: [facility] ENDOSCOPY;  Service: Endoscopy;;  . Joan Mayans  11/17/2017   Procedure: Joan Mayans;  Surgeon: Arta Silence, MD;  Location: [facility] ENDOSCOPY;  Service: Endoscopy;;    Allergies: Patient has no known allergies.  Medications: Prior to Admission medications   Medication Sig Start Date End Date Taking? Authorizing Provider  FLUoxetine (PROZAC) 10 MG capsule Take 10 mg by mouth daily as needed (depression).  07/14/17 07/15/18  [provider]  ibuprofen (ADVIL,MOTRIN) 400 MG tablet Take 1 tablet (400 mg total) by mouth every 6 (six) hours as needed (back pain). 11/19/17  Arrien, Jimmy Picket, MD  lidocaine-prilocaine (EMLA) cream Apply to affected area once 11/23/17   Truitt Merle, MD  LORazepam (ATIVAN) 0.5 MG tablet Take 1 tablet (0.5 mg total) by mouth 2 (two) times daily as needed for anxiety or sleep. Patient taking differently: Take  0.5 mg by mouth at bedtime.  12/10/17   Truitt Merle, MD  magic mouthwash w/lidocaine SOLN Take 5 mLs by mouth 2 (two) times daily as needed for mouth pain. Take 15 ml by mouth qid before meals and bedtime. For chemotherapy induced mucositis 01/10/18 03/12/18  [provider]  morphine (MS CONTIN) 15 MG 12 hr tablet Take 1 tablet (15 mg total) by mouth every 12 (twelve) hours. 11/23/17   Truitt Merle, MD  NON FORMULARY Take 13.5 mg by mouth daily at 12 noon. [employer] ([phone] pemigatinib [phone]) 13.5 mg tablet  Add as: unknown  Dose: 13.5 mg Take 1 tablet (13.5 mg total) by mouth daily for 21 days. 01/27/2018 02/18/2018 [facility] 01/28/2018    [provider]  ondansetron (ZOFRAN) 8 MG tablet Take 1 tablet (8 mg total) by mouth 2 (two) times daily as needed. Start on the third day after chemotherapy. 11/23/17   Truitt Merle, MD  oxyCODONE (OXY IR/ROXICODONE) 5 MG immediate release tablet Take 1 tablet (5 mg total) by mouth every 4 (four) hours as needed for moderate pain. 11/23/17   Truitt Merle, MD  pantoprazole (PROTONIX) 40 MG tablet Take 1 tablet (40 mg total) by mouth daily. 11/20/17 02/04/18  Arrien, Jimmy Picket, MD  polyethylene glycol [facility] Floria Raveling) packet Take 17 g by mouth daily. 11/20/17   Arrien, Jimmy Picket, MD  promethazine (PHENERGAN) 25 MG tablet Take 1 tablet (25 mg total) by mouth every 6 (six) hours as needed for nausea or vomiting. Patient not taking: Reported on 02/04/2018 11/19/17   Arrien, Jimmy Picket, MD  senna (SENOKOT) 8.6 MG TABS tablet Take 1 tablet by mouth daily as needed for mild constipation.    [provider]  sucralfate (CARAFATE) 1 g tablet Crush 1 tab and mix in 1 oz water 12 qAC and HS Patient not taking: Reported on 02/04/2018 12/02/17   Hayden Pedro, PA-C     Family History  Problem Relation Age of Onset  . Lung cancer Father        dx 68s, d 104, asbestos exposure  . Heart attack Paternal Uncle     Social  History   Socioeconomic History  . Marital status: Married    Spouse name: Not on file  . Number of children: Not on file  . Years of education: Not on file  . Highest education level: Not on file  Occupational History  . Not on file  Social Needs  . Financial resource strain: Not on file  . Food insecurity:    Worry: Not on file    Inability: Not on file  . Transportation needs:    Medical: Not on file    Non-medical: Not on file  Tobacco Use  . Smoking status: Former Smoker    Years: 20.00    Last attempt to quit: 2019    Years since quitting: 1.0  . Smokeless tobacco: Never Used  Substance and Sexual Activity  . Alcohol use: Not Currently  . Drug use: Never  . Sexual activity: Not on file  Lifestyle  . Physical activity:    Days per week: Not on file    Minutes per session: Not on file  .  Stress: Not on file  Relationships  . Social connections:    Talks on phone: Not on file    Gets together: Not on file    Attends religious service: Not on file    Active member of club or organization: Not on file    Attends meetings of clubs or organizations: Not on file    Relationship status: Not on file  Other Topics Concern  . Not on file  Social History Narrative  . Not on file     Review of Systems: A 12 point ROS discussed and pertinent positives are indicated in the HPI above.  All other systems are negative.  Review of Systems  Vital Signs: BP 103/70   Pulse 82   Temp 97.7 F (36.5 C) (Oral)   Resp 16   Ht 5\' 7"  (1.702 m)   Wt 59 kg   SpO2 95%   BMI 20.36 kg/m   Physical Exam Vitals signs reviewed.  Constitutional:      Appearance: She is ill-appearing.     Comments: Cachectic   HENT:     Head: Normocephalic and atraumatic.  Eyes:     Extraocular Movements: Extraocular movements intact.  Neck:     Musculoskeletal: Normal range of motion.  Cardiovascular:     Rate and Rhythm: Normal rate and regular rhythm.  Pulmonary:     Effort: Pulmonary  effort is normal.     Breath sounds: Normal breath sounds.  Abdominal:     General: There is distension.  Musculoskeletal: Normal range of motion.  Skin:    General: Skin is warm and dry.  Neurological:     General: No focal deficit present.     Mental Status: She is alert and oriented to person, place, and time.  Psychiatric:        Mood and Affect: Mood normal.        Behavior: Behavior normal.        Thought Content: Thought content normal.        Judgment: Judgment normal.     Imaging: Ct Abdomen Pelvis W Contrast  Result Date: 01/31/2018 CLINICAL DATA:  Increasing abdominal bloating with right-sided abdominal pain for 3 weeks. On chemotherapy for metastatic breast cancer. EXAM: CT ABDOMEN AND PELVIS WITH CONTRAST TECHNIQUE: Multidetector CT imaging of the abdomen and pelvis was performed using the standard protocol following bolus administration of intravenous contrast. CONTRAST:  193mL OMNIPAQUE IOHEXOL 300 MG/ML  SOLN COMPARISON:  Abdominopelvic CT 11/15/2017. Abdominal MRI 11/16/2017. FINDINGS: Lower chest: Enlarging nodules at both lung bases consistent with metastatic disease. These measure up to 5 mm in the right lower lobe on image 5/4. There is a small dependent left pleural effusion with mild left lower lobe atelectasis. Coronary artery atherosclerosis and a small hiatal hernia are noted. Hepatobiliary: Previously demonstrated widespread hepatic abnormalities are smaller and lower in density, likely treated metastatic disease. The largest focal components measures 7.7 x 4.5 cm posteriorly in the right hepatic lobe on image 18/3 (previously 8.3 x 5.7 cm) and 8.2 x 6.0 cm anteriorly in the right lobe on image 25/3 (previously 10.0 x 6.8 cm). Residual viable tumor is certainly possible. There is mild irregular gallbladder wall thickening without calcified cholelithiasis. Metallic biliary stents are in place with gas in the stents. No intrahepatic pneumobilia or significant biliary  dilatation identified. Pancreas: Unremarkable. No pancreatic ductal dilatation or surrounding inflammatory changes. Spleen: Normal in size without focal abnormality. Adrenals/Urinary Tract: Both adrenal glands appear normal. The kidneys appear  normal without evidence of urinary tract calculus, suspicious lesion or hydronephrosis. No bladder abnormalities are seen. Stomach/Bowel: No evidence of focal bowel wall thickening, distention or surrounding inflammatory change. Vascular/Lymphatic: Previously demonstrated adenopathy in the porta hepatis has resolved. There are small residual retroperitoneal lymph nodes. There is mild aortic and branch vessel atherosclerosis. The portal, superior mesenteric and splenic veins are patent. Reproductive: The cervix appears stable post hysterectomy. No adnexal mass. Other: There is a large amount of ascites. There is minimal nodularity in the right pericolic gutter (image 12/2). Musculoskeletal: Multifocal blastic osseous metastatic disease again noted in the thoracolumbar spine, similar to previous study and further detailed on separate examination of the lumbar spine. Bilateral breast implants without chest wall lesion. IMPRESSION: 1. New large amount of ascites throughout the peritoneal cavity, accounting for the abdominal distension. Minimal peritoneal nodularity in the right pericolic gutter, potentially malignant ascites. 2. The hepatic and porta hepatis nodal metastatic disease has improved. Osseous metastatic disease is grossly stable. 3. Increased nodularity at the lung bases, suspicious for progressive metastatic disease. 4. Status post interval biliary stenting. There is air within the lumen of the stent, but not within the intrahepatic bile ducts. Electronically Signed   By: Richardean Sale M.D.   On: 01/31/2018 14:43   US Paracentesis  Result Date: 02/09/2018 INDICATION: Patient with metastatic cholangiocarcinoma. Abdominal distention secondary to ascites. Request  for diagnostic and therapeutic paracentesis. EXAM: ULTRASOUND GUIDED LEFT LOWER QUADRANT PARACENTESIS MEDICATIONS: None. COMPLICATIONS: None immediate. PROCEDURE: Informed written consent was obtained from the patient after a discussion of the risks, benefits and alternatives to treatment. A timeout was performed prior to the initiation of the procedure. Initial ultrasound scanning demonstrates a large amount of ascites within the right lower abdominal quadrant. The right lower abdomen was prepped and draped in the usual sterile fashion. 1% lidocaine with epinephrine was used for local anesthesia. Following this, a 19 gauge, 7-cm, Yueh catheter was introduced. An ultrasound image was saved for documentation purposes. The paracentesis was performed. The catheter was removed and a dressing was applied. The patient tolerated the procedure well without immediate post procedural complication. FINDINGS: A total of approximately 5.3 L of clear yellow fluid was removed. Samples were sent to the laboratory as requested by the clinical team. IMPRESSION: Successful ultrasound-guided paracentesis yielding 5.3 liters of peritoneal fluid. Read by: Ascencion Dike PA-C Electronically Signed   By: Markus Daft M.D.   On: 02/09/2018 16:12   Ct L-spine No Charge  Result Date: 01/31/2018 CLINICAL DATA:  Increasing abdominal bloating with right-sided abdominal pain for 3 weeks. On chemotherapy for metastatic breast cancer. EXAM: CT LUMBAR SPINE WITHOUT CONTRAST TECHNIQUE: Multiplanar CT image reconstructions of the lumbar spine were generated from the abdominal CT data obtained concurrently. COMPARISON: Abdominal radiographs 01/17/2018.  Abdominopelvic CT 11/15/2017. FINDINGS: Segmentation: Normal. Alignment: Normal. Vertebrae: Multifocal osseous metastatic disease is again noted with relatively limited involvement in the lumbar spine. There is a sclerotic component within the left aspect of the L1 vertebral body and the left pedicle  which has not significantly changed. Prominent osseous metastases are present in the T8, T9 and T10 vertebral bodies and posterior elements, with lesser involvement at T11 and T12. Small sclerotic metastases are seen in the bony pelvis. No pathologic fracture or epidural tumor identified. Paraspinal and other soft tissues: Reported separately on abdominopelvic CT. Disc levels: No large disc herniation, spinal stenosis, nerve root encroachment or epidural tumor. Chronic degenerative disc disease at L5-S1 with mild foraminal narrowing bilaterally. Asymmetric facet  hypertrophy on the right at L4-5. IMPRESSION: 1. Similar multifocal osseous metastatic disease involving the thoracolumbar spine and bony pelvis compared with prior CT from 10 weeks ago. No pathologic fracture or epidural tumor identified. 2. Mild lumbar spondylosis without large disc herniation or significant spinal stenosis. 3. Abdominal findings dictated separately. Electronically Signed   By: Richardean Sale M.D.   On: 01/31/2018 14:50   Dg Ercp  Result Date: 02/09/2018 CLINICAL DATA:  Biliary stent EXAM: ERCP TECHNIQUE: Multiple spot images obtained with the fluoroscopic device and submitted for interpretation post-procedure. FLUOROSCOPY TIME:  Fluoroscopy Time:  15 minutes and 11 seconds Radiation Exposure Index (if provided by the fluoroscopic device): Number of Acquired Spot Images: 0 COMPARISON:  None. FINDINGS: Imaging demonstrates cannulation of the stent within the common bile duct. Balloon stone retrieval within the stent is documented. Contrast fills the biliary tree and stent. IMPRESSION: See above. These images were submitted for radiologic interpretation only. Please see the procedural report for the amount of contrast and the fluoroscopy time utilized. Electronically Signed   By: Marybelle Killings M.D.   On: 02/09/2018 14:27   Vas [place] Lower Extremity Venous (dvt)  Result Date: 02/04/2018  Lower Venous Study Indications: Swelling.   Performing Technologist: Toma Copier, D  Examination Guidelines: A complete evaluation includes B-mode imaging, spectral Doppler, color Doppler, and power Doppler as needed of all accessible portions of each vessel. Bilateral testing is considered an integral part of a complete examination. Limited examinations for reoccurring indications may be performed as noted.  Right Venous Findings: +---------+---------------+---------+-----------+----------+-------+          CompressibilityPhasicitySpontaneityPropertiesSummary +---------+---------------+---------+-----------+----------+-------+ CFV      Full           Yes      Yes                          +---------+---------------+---------+-----------+----------+-------+ SFJ      Full                                                 +---------+---------------+---------+-----------+----------+-------+ FV Prox  Full           Yes      Yes                          +---------+---------------+---------+-----------+----------+-------+ FV Mid   Full           Yes      Yes                          +---------+---------------+---------+-----------+----------+-------+ FV DistalFull           Yes      Yes                          +---------+---------------+---------+-----------+----------+-------+ PFV      Full           Yes      Yes                          +---------+---------------+---------+-----------+----------+-------+ POP      Full           Yes      Yes                          +---------+---------------+---------+-----------+----------+-------+  PTV      Full                                                 +---------+---------------+---------+-----------+----------+-------+ PERO     Full                                                 +---------+---------------+---------+-----------+----------+-------+  Left Venous Findings: +---------+---------------+---------+-----------+----------+-------+           CompressibilityPhasicitySpontaneityPropertiesSummary +---------+---------------+---------+-----------+----------+-------+ CFV      Full           Yes      Yes                          +---------+---------------+---------+-----------+----------+-------+ SFJ      Full                                                 +---------+---------------+---------+-----------+----------+-------+ FV Prox  Full           Yes      Yes                          +---------+---------------+---------+-----------+----------+-------+ FV Mid   Full           Yes      Yes                          +---------+---------------+---------+-----------+----------+-------+ FV DistalFull           Yes      Yes                          +---------+---------------+---------+-----------+----------+-------+ PFV      Full           Yes      Yes                          +---------+---------------+---------+-----------+----------+-------+ POP      Full           Yes      Yes                          +---------+---------------+---------+-----------+----------+-------+ PTV      Full                                                 +---------+---------------+---------+-----------+----------+-------+ PERO     Full                                                 +---------+---------------+---------+-----------+----------+-------+    Summary: Right: There is no evidence of deep vein thrombosis in the  lower extremity. No cystic structure found in the popliteal fossa. Left: There is no evidence of deep vein thrombosis in the lower extremity. No cystic structure found in the popliteal fossa.  *See table(s) above for measurements and observations. Electronically signed by Monica Martinez MD on 02/04/2018 at 5:48:39 PM.    Final    US Abdomen Limited Ruq  Result Date: 01/31/2018 CLINICAL DATA:  Abdominal pain for 2 days. Biliary stent placement 11/17/2017. Metastatic breast cancer. EXAM: ULTRASOUND  ABDOMEN LIMITED RIGHT UPPER QUADRANT COMPARISON:  CT same date. FINDINGS: Gallbladder: 18 mm echogenic focus in the gallbladder lumen demonstrates no shadowing and internal blood flow, consistent with a polyp or metastasis. There is moderate diffuse gallbladder wall thickening to 8 mm. No sonographic Murphy's sign. Common bile duct: Diameter: Not visualized.  Metallic biliary stent in place. Liver: Widespread metastatic disease is present throughout the liver. The liver has nodular contours. Portal vein is patent on color Doppler imaging with normal direction of blood flow towards the liver. Moderate amount of perihepatic ascites noted. IMPRESSION: 1. Widespread metastatic disease to the liver and ascites as demonstrated on earlier CT. 2. The biliary system is not well evaluated by ultrasound given the metallic biliary stent. 3. Nonspecific gallbladder wall thickening with small vascular nodule in the gallbladder lumen, likely a polyp or metastasis. Electronically Signed   By: Richardean Sale M.D.   On: 01/31/2018 19:21    Labs:  CBC: Recent Labs    12/01/17 1154 12/07/17 1310 01/31/18 1136 02/23/18 1355  WBC 11.5* 11.7* 15.3* 18.1*  HGB 9.0* 9.0* 11.7* 10.3*  HCT 28.0* 29.5* 35.2* 32.8*  PLT 305 196 227 175    COAGS: Recent Labs    11/15/17 1113 11/15/17 1706 12/07/17 1310 01/31/18 1136  INR 0.91 0.95 1.07 1.19  APTT 27  --   --   --     BMP: Recent Labs    11/19/17 0421 11/23/17 1305 12/01/17 1154 01/31/18 1136  NA 129* 132* 134* 127*  K 3.4* 3.8 4.3 4.6  CL 95* 95* 98 98  CO2 24 27 27  20*  GLUCOSE 98 111* 108* 114*  BUN 11 12 13 12   CALCIUM 8.0* 8.9 9.1 8.1*  CREATININE 0.44 0.60 0.69 0.76  GFRNONAA >60 >60 >60 >60  GFRAA >60 >60 >60 >60    LIVER FUNCTION TESTS: Recent Labs    11/19/17 0421 11/23/17 1305 12/01/17 1154 01/31/18 1136  BILITOT 7.3* 4.3* 3.0* 3.4*  AST 108* 95* 82* 99*  ALT 95* 66* 44 40  ALKPHOS 912* 1,330* 1,138* 1,139*  PROT 5.0* 5.9* 6.2*  4.9*  ALBUMIN 2.2* 2.3* 2.4* 2.0*    TUMOR MARKERS: No results for input(s): AFPTM, CEA, CA199, CHROMGRNA in the last 8760 hours.  Assessment and Plan:  Recurrent ascites in the setting of metastatic cholangiocarcinoma.  Will proceed with placement of tunneled peritoneal catheter today by Dr. Earleen Newport.  Will monitor BP closely and give albumin if needed per protocol.  Risks and benefits discussed with the patient including bleeding, infection, damage to adjacent structures, malfunction of the catheter with need for additional procedures.  All of the patient's questions were answered, patient is agreeable to proceed. Consent signed and in chart.  Thank you for this interesting consult.  I greatly enjoyed meeting JAZZ BIDDY and look forward to participating in their care.  A copy of this report was sent to the requesting provider on this date.  Electronically Signed: Murrell Redden, PA-C   02/23/2018, 2:05 PM  I spent a total of  30 Minutes in face to face in clinical consultation, greater than 50% of which was counseling/coordinating care for tunneled peritoneal catheter.

## 2018-02-23 NOTE — Procedures (Signed)
Interventional Radiology Procedure Note  Procedure: Placement of an abd tunneled cuffed catheter.  catheter is ready for immediate use.  Complications: None Recommendations:  - Ok to use.  - Do not submerge for 7 days - Routine wound care  - DC 30 min  Signed,  Tifanie Gardiner S. Earleen Newport, DO

## 2018-02-23 NOTE — Discharge Instructions (Signed)
Moderate Conscious Sedation, Adult, Care After These instructions provide you with information about caring for yourself after your procedure. Your health care provider may also give you more specific instructions. Your treatment has been planned according to current medical practices, but problems sometimes occur. Call your health care provider if you have any problems or questions after your procedure. What can I expect after the procedure? After your procedure, it is common:  To feel sleepy for several hours.  To feel clumsy and have poor balance for several hours.  To have poor judgment for several hours.  To vomit if you eat too soon. Follow these instructions at home: For at least 24 hours after the procedure:   Do not: ? Participate in activities where you could fall or become injured. ? Drive. ? Use heavy machinery. ? Drink alcohol. ? Take sleeping pills or medicines that cause drowsiness. ? Make important decisions or sign legal documents. ? Take care of children on your own.  Rest. Eating and drinking  Follow the diet recommended by your health care provider.  If you vomit: ? Drink water, juice, or soup when you can drink without vomiting. ? Make sure you have little or no nausea before eating solid foods. General instructions  Have a responsible adult stay with you until you are awake and alert.  Take over-the-counter and prescription medicines only as told by your health care provider.  If you smoke, do not smoke without supervision.  Keep all follow-up visits as told by your health care provider. This is important. Contact a health care provider if:  You keep feeling nauseous or you keep vomiting.  You feel light-headed.  You develop a rash.  You have a fever. Get help right away if:  You have trouble breathing. This information is not intended to replace advice given to you by your health care provider. Make sure you discuss any questions you have  with your health care provider. Document Released: 11/02/2012 Document Revised: 06/17/2015 Document Reviewed: 05/04/2015 Elsevier Interactive Patient Education  2019 [employer].    Ascites Drainage Catheter Home Guide  An ascites drainage catheter is a thin, flexible tube that helps drain excess fluid that has collected in the abdomen (ascites). Draining the fluid can help prevent pain and keep other problems from developing. There are several kinds of ascites drainage systems. Follow general guidelines as well as instructions about your specific drainage system. Your health care provider will instruct you on:  How to use your system.  How much fluid to drain.  How often to drain the fluid. Call your health care provider if you have any questions or concerns. What are the risks? The ascites drainage catheter system is generally safe. However, problems may occur, including:  Infection.  Bleeding.  A dislodged or blocked catheter. Supplies needed:  Soap and warm water.  A mask.  Alcohol wipes.  Gauze.  Paper tape.  Drainage container.  Alcohol-based cleaner or bleach-based cleaner. How to use the catheter to drain fluid 1. Clean any surface that you will be using. 2. Wash your hands with soap and warm water. If your hands are not visibly dirty, you can use an alcohol-based skin cleanser instead. 3. Put on a mask. Make sure that it covers your nose and mouth. 4. Place all the supplies you will need on the surfaces that you have cleaned. Make sure that you can easily reach them. 5. Clean the safety valve on the end of the drainage catheter with an  alcohol wipe. 6. Attach the adapter or access tip to the safety valve on your catheter. To do this, you may need to remove a protective cap on the safety valve. 7. Release the clamps on your drainage container. 8. Allow the fluid to drain into the container. 9. Close the clamps and release the catheter from the drainage  container. 10. Write down the amount of fluid that was drained, if your health care provider has asked you to do that. 11. Follow instructions from your health care provider about collecting and transporting the fluid. Your health care provider may ask you to collect the fluid and take it to the lab to be analyzed. If you are told to dispose of the fluid, pour it down the toilet or do as told by your health care provider. 12. Clean the surfaces where any fluid was spilled with an alcohol-based or bleach-based cleaner. What should I do after I drain the fluid? 1. Wipe the end of your catheter with an alcohol wipe. 2. Put the protective cap back onto the safety valve. 3. Follow instructions from your health care provider and: ? Place gauze bandages (dressings) around the catheter area. ? Secure the catheter to your body with gauze and tape. Contact a health care provider if you have:  Skin irritation, redness, or pain where the catheter was inserted (insertion site).  Trouble draining fluid.  Trouble caring for your catheter.  Abdominal pain, bloating, or cramping. Get help right away if you:  Develop a fever or chills.  Have increasing redness or increasing pain around the insertion site.  Have blood or pus coming from the insertion site.  Notice that the fluid that drains out of the catheter becomes cloudy or has a bad smell.  Have abdominal pain or cramping that worsens or does not go away. Summary  An ascites drainage catheter is a thin, flexible tube that helps drain excess fluid that has collected in your abdomen.  There are several kinds of ascites drainage systems. Follow general guidelines as well as your health care provider's instructions about your specific drainage system.  Follow instructions from your health care provider about collecting and transporting the fluid. Your health care provider may ask you to collect the fluid and take it to the lab to be analyzed. This  information is not intended to replace advice given to you by your health care provider. Make sure you discuss any questions you have with your health care provider. Document Released: 01/01/2011 Document Revised: 12/02/2016 Document Reviewed: 12/02/2016 Elsevier Interactive Patient Education  2019 [employer].

## 2018-02-25 ENCOUNTER — Telehealth: Payer: Self-pay

## 2018-02-25 NOTE — Telephone Encounter (Signed)
Faxed signed orders back to [facility], sent to [facility] for scanning to chart.  

## 2018-02-26 DEATH — deceased

## 2018-02-28 ENCOUNTER — Other Ambulatory Visit: Payer: Self-pay | Admitting: Obstetrics and Gynecology

## 2018-03-02 ENCOUNTER — Ambulatory Visit: Payer: BLUE CROSS/BLUE SHIELD | Admitting: Hematology

## 2018-03-02 ENCOUNTER — Telehealth: Payer: Self-pay

## 2018-03-02 LAB — FUNGUS CULTURE WITH STAIN

## 2018-03-02 LAB — FUNGUS CULTURE RESULT

## 2018-03-02 LAB — FUNGAL ORGANISM REFLEX

## 2018-03-02 NOTE — Telephone Encounter (Signed)
Faxed signed order to [facility], sent to [facility] for scanning to chart.

## 2019-04-15 IMAGING — CT CT L SPINE W/O CM
4 of 8 series · 14 of 33 positions shown, 15 images · IV contrast (Omni 300)
Comparison: Abdominal radiographs 01/17/2018.  Abdominopelvic CT 11/15/2017.

CLINICAL DATA: Increasing abdominal bloating with right-sided
abdominal pain for 3 weeks. On chemotherapy for metastatic breast
cancer.

EXAM:
CT LUMBAR SPINE WITHOUT CONTRAST
TECHNIQUE: Multiplanar CT image reconstructions of the lumbar spine were
generated from the abdominal CT data obtained concurrently.

[Series 3: a/p w/ 5mm · axial · 0.71mm/px · z∈[+850,+1086]mm · 3 of 95 slices shown, 4 images]
[im 24/95  soft-tissue]
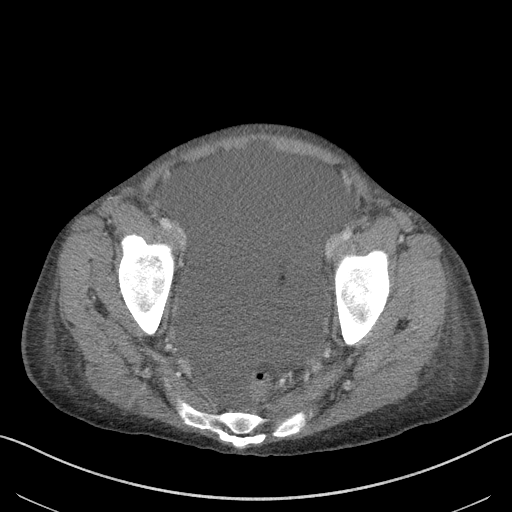
[im 24/95  bone]
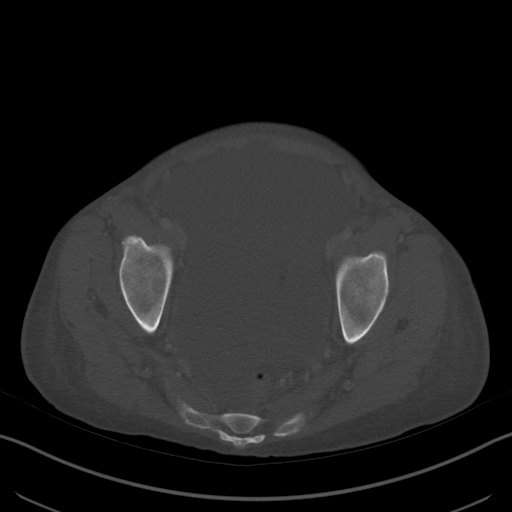
[im 48/95  bone]
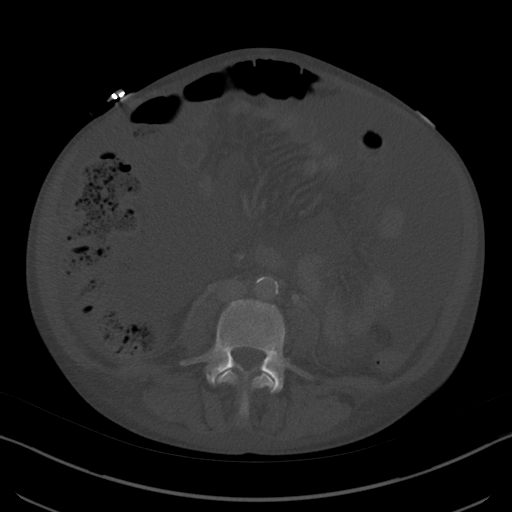
[im 71/95  bone]
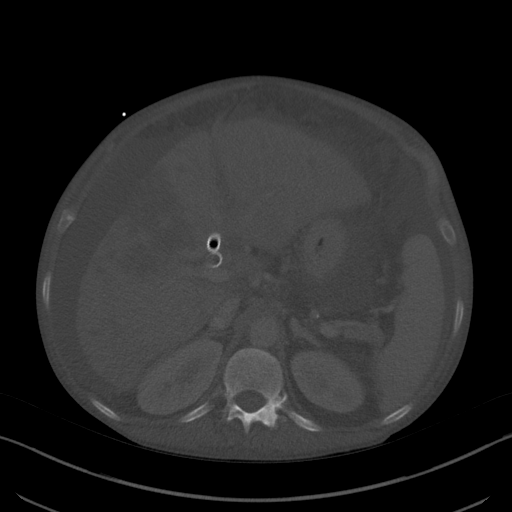

[Series 6: a/p w/ cor · coronal · 0.78mm/px · 2 of 146 slices shown]
[im 49/146  bone]
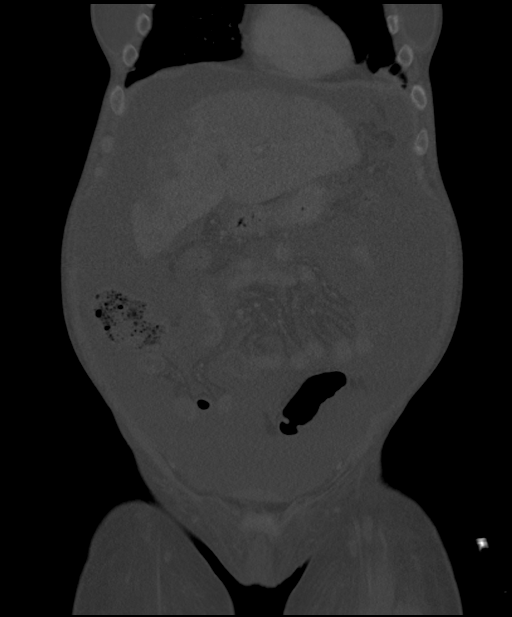
[im 97/146  bone]
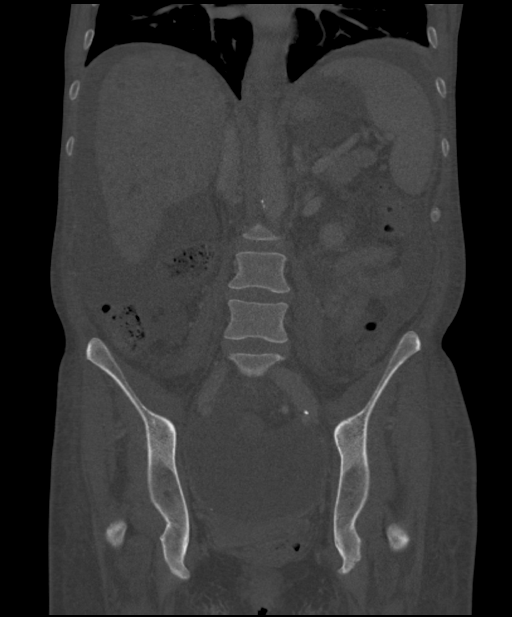

[Series 7: a/p w/ sag · sagittal · 0.70mm/px · 5 of 168 slices shown]
[im 42/168  bone]
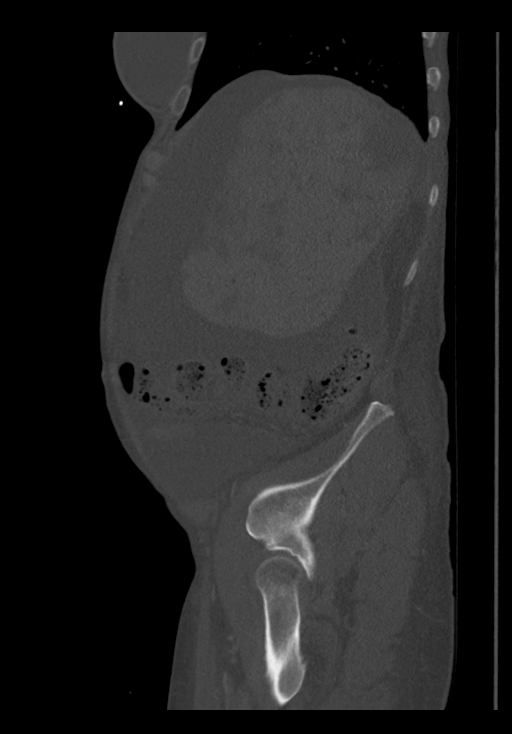
[im 63/168  bone]
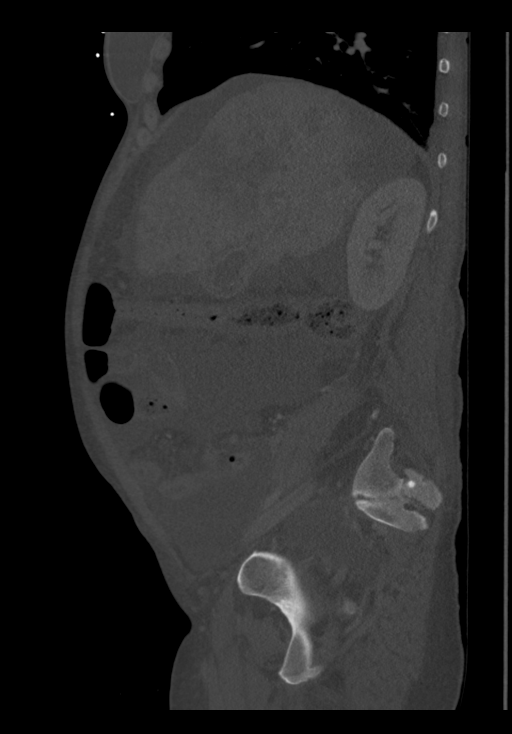
[im 84/168  bone]
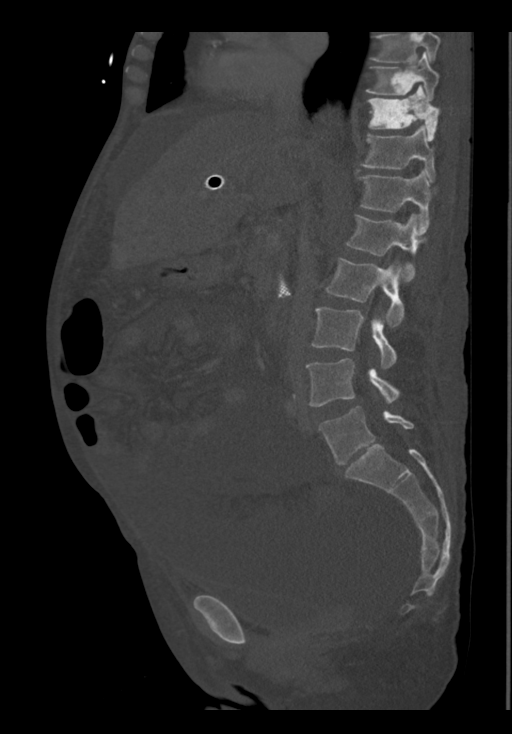
[im 105/168  bone]
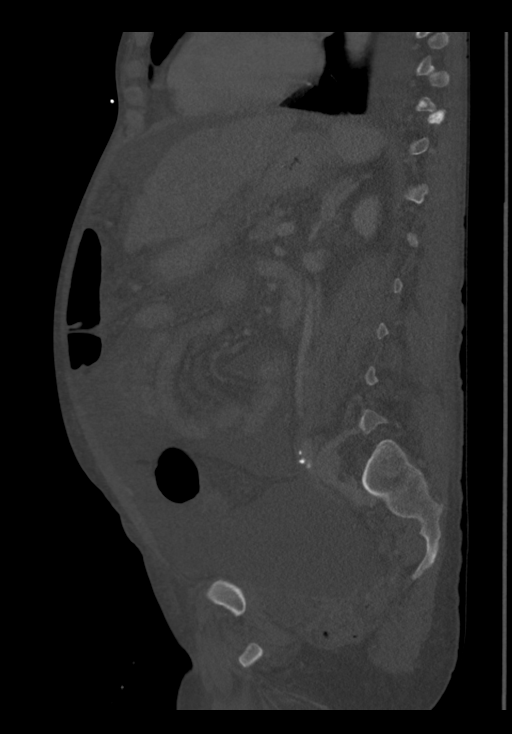
[im 126/168  bone]
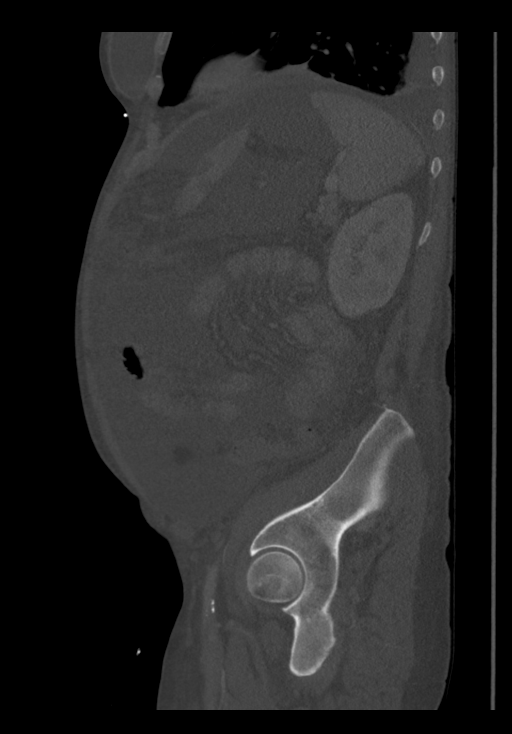

[Series 9: lspine st · axial · 0.36mm/px · z∈[+914,+1050]mm · 4 of 114 slices shown]
[im 23/114  bone]
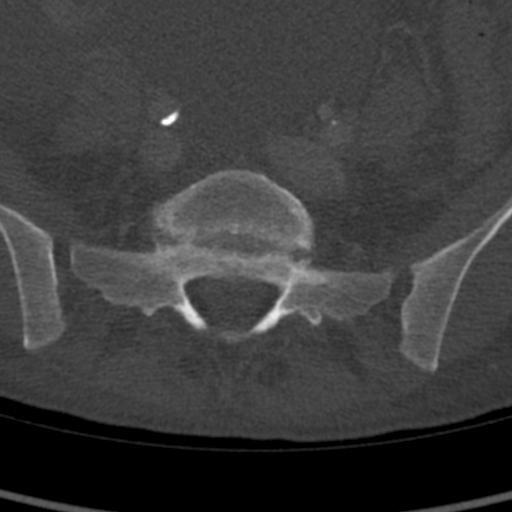
[im 46/114  bone]
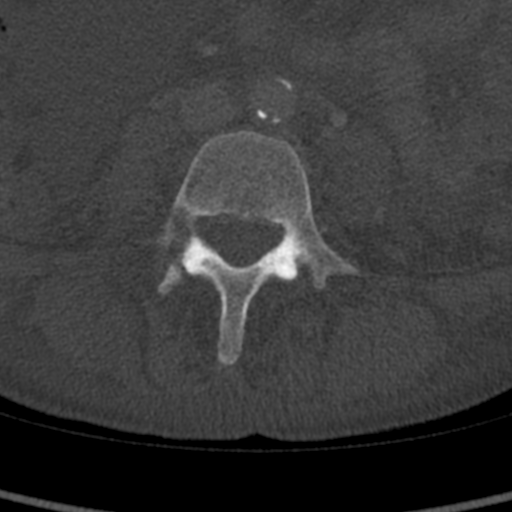
[im 68/114  bone]
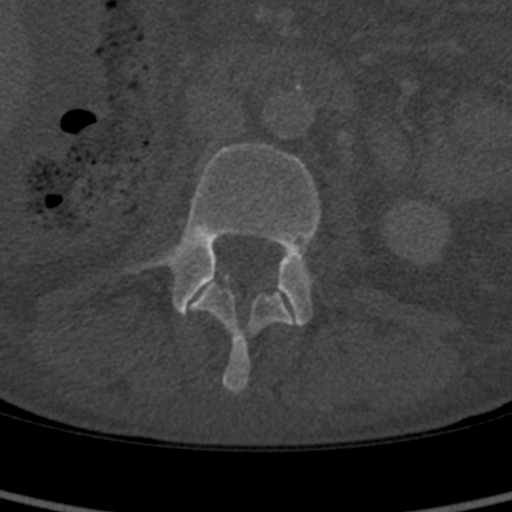
[im 91/114  bone]
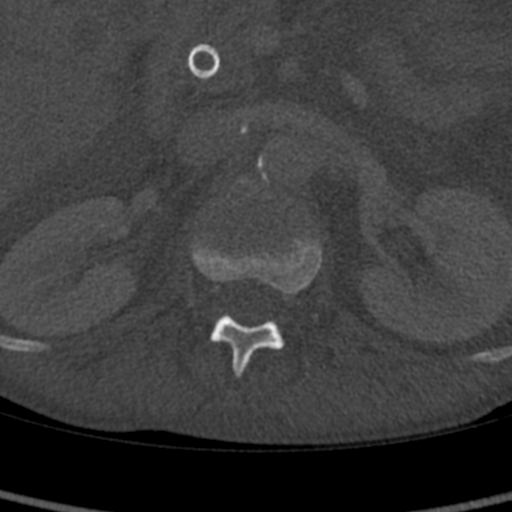

[14 of 33 positions shown; findings below may reference images not displayed]

FINDINGS: Segmentation: Normal.

Alignment: Normal.

Vertebrae: Multifocal osseous metastatic disease is again noted with
relatively limited involvement in the lumbar spine. There is a
sclerotic component within the left aspect of the L1 vertebral body
and the left pedicle which has not significantly changed. Prominent
osseous metastases are present in the T8, T9 and T10 vertebral
bodies and posterior elements, with lesser involvement at T11 and
T12. Small sclerotic metastases are seen in the bony pelvis. No
pathologic fracture or epidural tumor identified.

Paraspinal and other soft tissues: Reported separately on
abdominopelvic CT.

Disc levels: No large disc herniation, spinal stenosis, nerve root
encroachment or epidural tumor. Chronic degenerative disc disease at
L5-S1 with mild foraminal narrowing bilaterally. Asymmetric facet
hypertrophy on the right at L4-5.
IMPRESSION: 1. Similar multifocal osseous metastatic disease involving the
thoracolumbar spine and bony pelvis compared with prior CT from 10
weeks ago. No pathologic fracture or epidural tumor identified.
2. Mild lumbar spondylosis without large disc herniation or
significant spinal stenosis.
3. Abdominal findings dictated separately.

## 2019-04-15 IMAGING — US US ABDOMEN LIMITED
1 series · 14 of 25 positions shown · non-contrast
Comparison: CT same date.

CLINICAL DATA: Abdominal pain for 2 days. Biliary stent placement
11/17/2017. Metastatic breast cancer.

EXAM:
ULTRASOUND ABDOMEN LIMITED RIGHT UPPER QUADRANT

[Series 1: us abdomen limited · 14 of 53 slices shown]
[im 1/53]
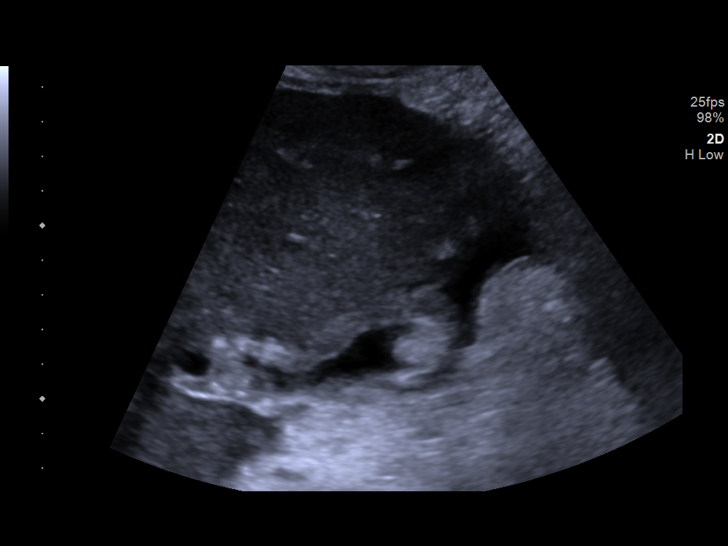
[im 5/53]
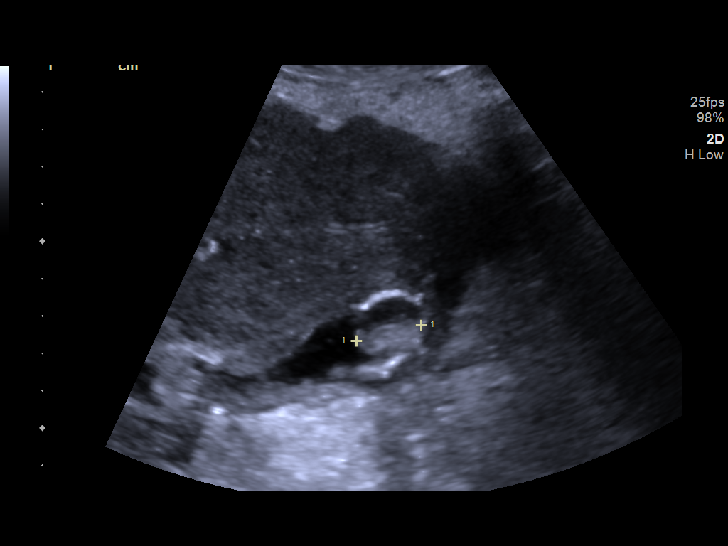
[im 9/53]
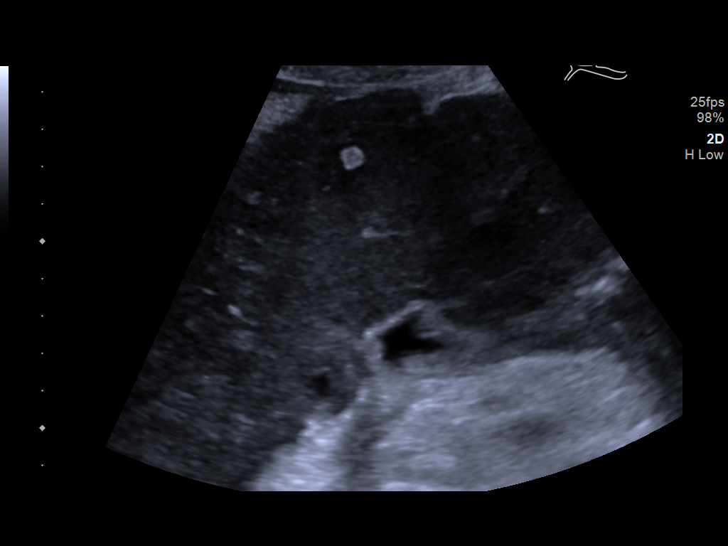
[im 14/53]
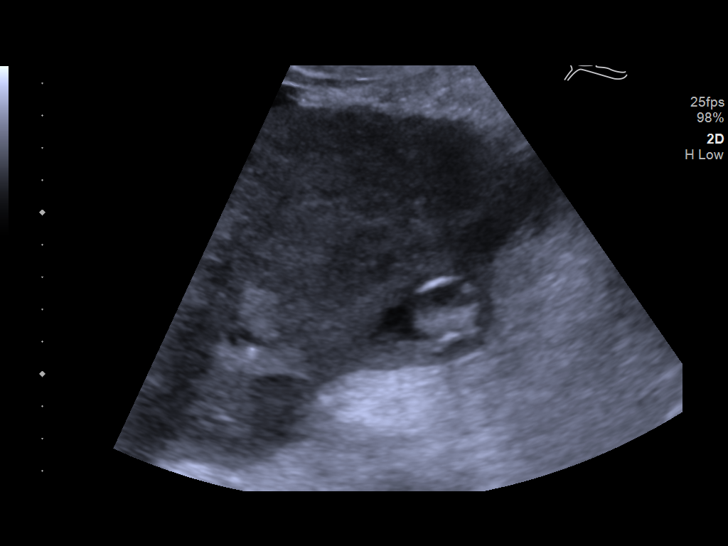
[im 18/53]
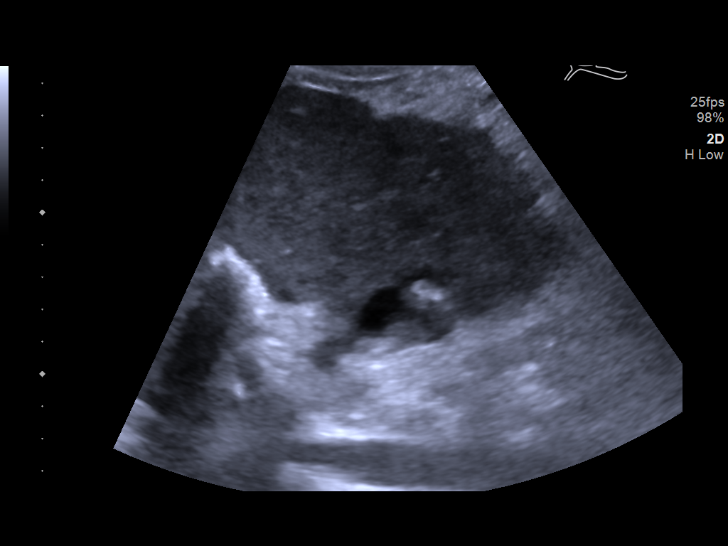
[im 20/53]
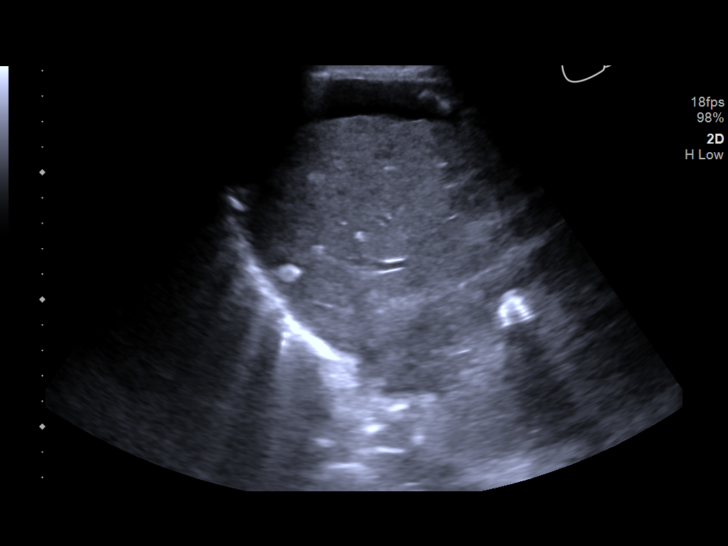
[im 24/53]
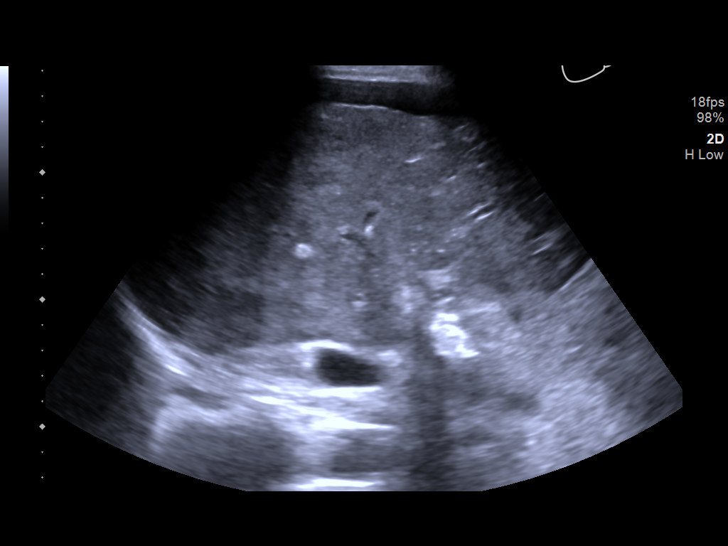
[im 29/53]
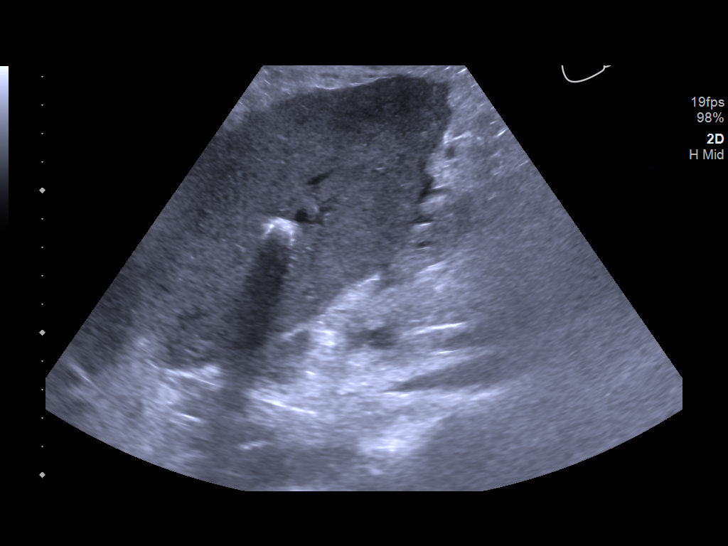
[im 33/53]
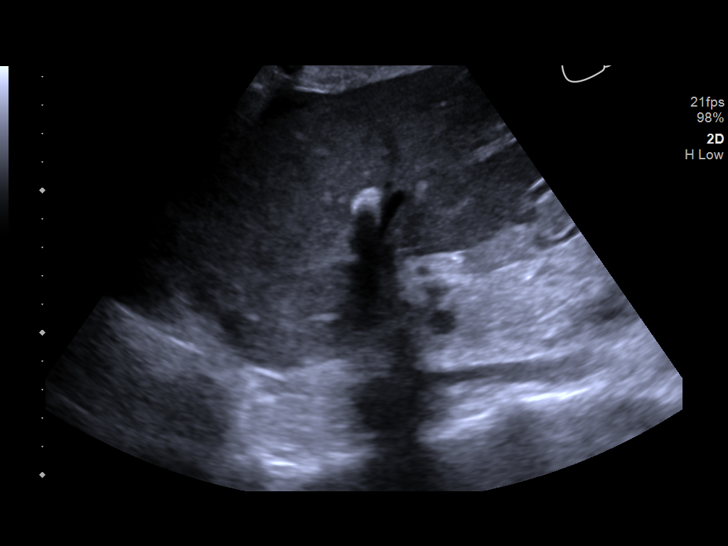
[im 35/53]
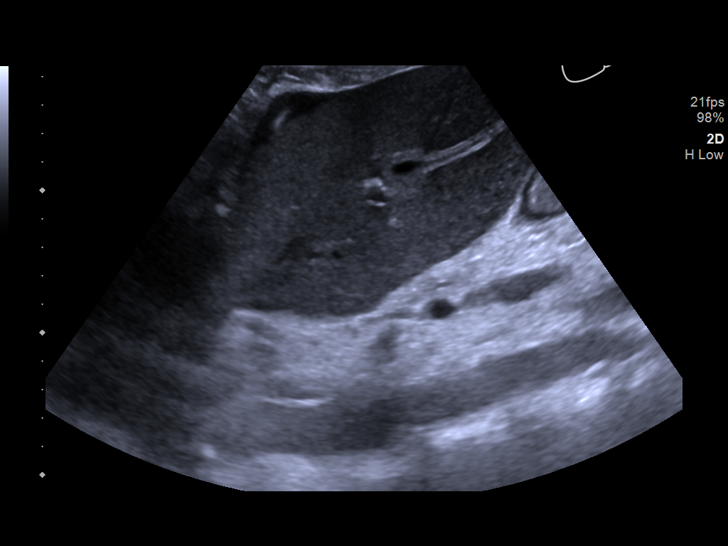
[im 40/53]
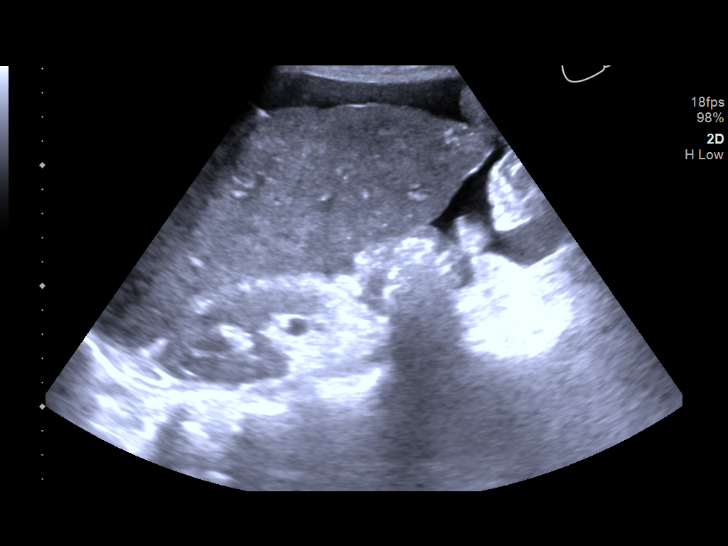
[im 44/53]
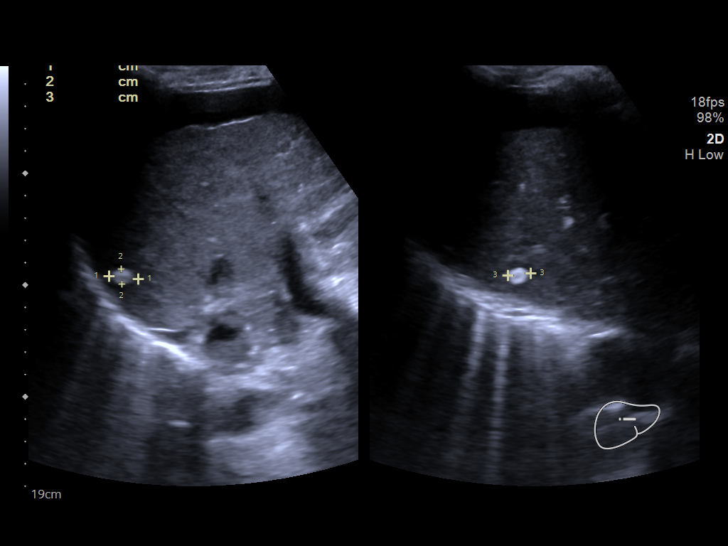
[im 48/53]
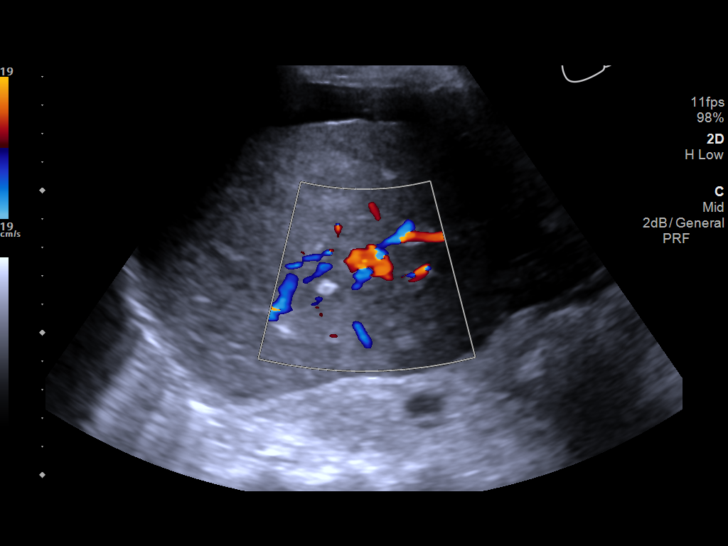
[im 53/53]
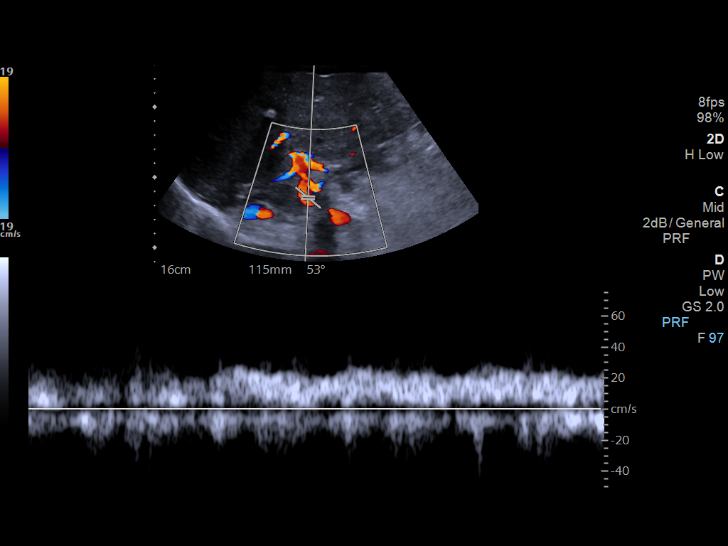

[14 of 25 positions shown; findings below may reference images not displayed]

FINDINGS: Gallbladder:

18 mm echogenic focus in the gallbladder lumen demonstrates no
shadowing and internal blood flow, consistent with a polyp or
metastasis. There is moderate diffuse gallbladder wall thickening to
8 mm. No sonographic Murphy's sign.

Common bile duct:

Diameter: Not visualized.  Metallic biliary stent in place.

Liver:

Widespread metastatic disease is present throughout the liver. The
liver has nodular contours. Portal vein is patent on color Doppler
imaging with normal direction of blood flow towards the liver.

Moderate amount of perihepatic ascites noted.
IMPRESSION: 1. Widespread metastatic disease to the liver and ascites as
demonstrated on earlier CT.
2. The biliary system is not well evaluated by ultrasound given the
metallic biliary stent.
3. Nonspecific gallbladder wall thickening with small vascular
nodule in the gallbladder lumen, likely a polyp or metastasis.

## 2019-05-08 IMAGING — US IR PERC TUN PERIT CATH WO PORT S&I /IMAG
1 series · 1 of 1 positions shown · non-contrast
Comparison: none

INDICATION: 57-year-old female with a history of malignant ascites

[Series 1: ir (id) (id)/(id)/(id) ir · 1 of 1 slices shown]
[im 1/1]
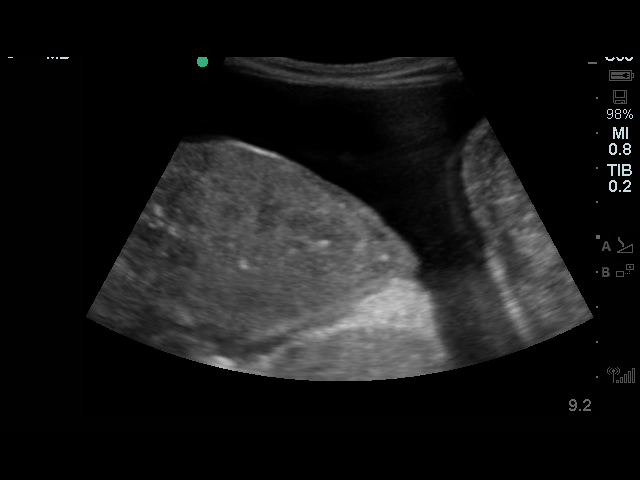

[1 of 1 positions shown; findings below may reference images not displayed]

EXAM:
IMAGE GUIDED PLACEMENT OF TUNNELED PERITONEAL CATHETER

MEDICATIONS:
2 g Ancef

ANESTHESIA/SEDATION:
Fentanyl 25 mcg IV; Versed 0.5 mg IV

Moderate Sedation Time:  14 minutes

The patient was continuously monitored during the procedure by the
interventional radiology nurse under my direct supervision.

COMPLICATIONS:
None

PROCEDURE:
The procedure, risks, benefits, and alternatives were explained to
the patient and the patient's family. Specific risks that were
addressed included bleeding, infection, need for further procedure,
chance of delayed hemorrhage, cardiopulmonary collapse, death.
Questions regarding the procedure were encouraged and answered. The
patient understands and consents to the procedure.

The right abdominal wall was prepped with Betadine in a sterile
fashion, and a sterile drape was applied covering the operative
field. A sterile gown and sterile gloves were used for the
procedure. Local anesthesia was provided with 1% Lidocaine.
Ultrasound image documentation was performed.

After creating a small skin incision, a 19 gauge needle was advanced
into the peritoneal cavity under ultrasound guidance. A guide wire
was then advanced under fluoroscopy into the space. Access was
dilated serially and a 16-French peel-away sheath placed.

The skin and subcutaneous tissues were generously infiltrated with
1% lidocaine from the puncture site along the abdomen anteriorly. A
small stab incision was made with 11 blade scalpel at the insertion
site of the catheter, and the catheter was back tunneled to the site
at the puncture.

A tunneled Pleurex catheter was placed. This was tunneled from the
incision 5 cm anterior to the access to the access site. The
catheter was advanced through the peel-away sheath. The sheath was
then removed. Final catheter positioning was confirmed with a
fluoroscopic spot image.

The access incision was closed with Derma bond. Dermabond was
applied to the catheterization incision. Large volume paracentesis
was performed through the new catheter utilizing vacuum containers.

The patient tolerated the procedure well and remained
hemodynamically stable throughout.

No complications were encountered and no significant blood loss was
encountered.
IMPRESSION: Status post image guided placement of tunneled peritoneal catheter.

PLAN:
Patient discharged home with family teaching on management of the
catheter.

Catheter is adjacent to the liver, and it would not be unusual for
referred right shoulder pain if the catheter rubs the diaphragm.
